# Patient Record
Sex: Female | Born: 1975 | Race: Black or African American | Hispanic: No | Marital: Single | Smoking: Never smoker
Health system: Southern US, Community
[De-identification: ages and names within clinical notes are randomized; demographics above are authoritative.]

## PROBLEM LIST (undated history)

## (undated) ENCOUNTER — Ambulatory Visit (HOSPITAL_COMMUNITY): Admission: EM | Payer: BC Managed Care – PPO | Source: Home / Self Care

## (undated) DIAGNOSIS — A048 Other specified bacterial intestinal infections: Secondary | ICD-10-CM

## (undated) DIAGNOSIS — K635 Polyp of colon: Secondary | ICD-10-CM

## (undated) DIAGNOSIS — D497 Neoplasm of unspecified behavior of endocrine glands and other parts of nervous system: Secondary | ICD-10-CM

## (undated) DIAGNOSIS — E7800 Pure hypercholesterolemia, unspecified: Secondary | ICD-10-CM

## (undated) DIAGNOSIS — R87629 Unspecified abnormal cytological findings in specimens from vagina: Secondary | ICD-10-CM

## (undated) DIAGNOSIS — K219 Gastro-esophageal reflux disease without esophagitis: Secondary | ICD-10-CM

## (undated) DIAGNOSIS — G43909 Migraine, unspecified, not intractable, without status migrainosus: Secondary | ICD-10-CM

## (undated) DIAGNOSIS — Z889 Allergy status to unspecified drugs, medicaments and biological substances status: Secondary | ICD-10-CM

## (undated) DIAGNOSIS — E041 Nontoxic single thyroid nodule: Secondary | ICD-10-CM

## (undated) HISTORY — DX: Unspecified abnormal cytological findings in specimens from vagina: R87.629

## (undated) HISTORY — DX: Gastro-esophageal reflux disease without esophagitis: K21.9

## (undated) HISTORY — DX: Migraine, unspecified, not intractable, without status migrainosus: G43.909

## (undated) HISTORY — DX: Polyp of colon: K63.5

## (undated) HISTORY — DX: Neoplasm of unspecified behavior of endocrine glands and other parts of nervous system: D49.7

## (undated) HISTORY — DX: Nontoxic single thyroid nodule: E04.1

## (undated) HISTORY — DX: Pure hypercholesterolemia, unspecified: E78.00

## (undated) HISTORY — DX: Other specified bacterial intestinal infections: A04.8

## (undated) HISTORY — DX: Allergy status to unspecified drugs, medicaments and biological substances: Z88.9

## (undated) HISTORY — PX: OTHER SURGICAL HISTORY: SHX169

---

## 2017-02-12 ENCOUNTER — Ambulatory Visit: Payer: Self-pay | Admitting: Family Medicine

## 2017-03-05 ENCOUNTER — Encounter: Payer: Self-pay | Admitting: Obstetrics & Gynecology

## 2017-03-14 ENCOUNTER — Encounter: Payer: Self-pay | Admitting: Nurse Practitioner

## 2017-03-14 ENCOUNTER — Other Ambulatory Visit (HOSPITAL_COMMUNITY)
Admission: RE | Admit: 2017-03-14 | Discharge: 2017-03-14 | Disposition: A | Discharge status: Home / Self Care | Payer: BLUE CROSS/BLUE SHIELD | Source: Ambulatory Visit | Attending: Nurse Practitioner | Admitting: Nurse Practitioner

## 2017-03-14 ENCOUNTER — Ambulatory Visit: Payer: Medicaid Other | Admitting: Nurse Practitioner

## 2017-03-14 VITALS — BP 112/72 | HR 92 | Ht 62.0 in | Wt 163.6 lb

## 2017-03-14 DIAGNOSIS — Z1389 Encounter for screening for other disorder: Secondary | ICD-10-CM | POA: Diagnosis not present

## 2017-03-14 DIAGNOSIS — Z01419 Encounter for gynecological examination (general) (routine) without abnormal findings: Secondary | ICD-10-CM | POA: Diagnosis not present

## 2017-03-14 DIAGNOSIS — Z0000 Encounter for general adult medical examination without abnormal findings: Secondary | ICD-10-CM

## 2017-03-14 DIAGNOSIS — A5901 Trichomonal vulvovaginitis: Secondary | ICD-10-CM | POA: Diagnosis not present

## 2017-03-14 DIAGNOSIS — D352 Benign neoplasm of pituitary gland: Secondary | ICD-10-CM | POA: Insufficient documentation

## 2017-03-14 DIAGNOSIS — E049 Nontoxic goiter, unspecified: Secondary | ICD-10-CM

## 2017-03-14 DIAGNOSIS — D259 Leiomyoma of uterus, unspecified: Secondary | ICD-10-CM | POA: Insufficient documentation

## 2017-03-14 DIAGNOSIS — N6011 Diffuse cystic mastopathy of right breast: Secondary | ICD-10-CM | POA: Insufficient documentation

## 2017-03-14 DIAGNOSIS — Z113 Encounter for screening for infections with a predominantly sexual mode of transmission: Secondary | ICD-10-CM | POA: Diagnosis not present

## 2017-03-14 DIAGNOSIS — N6012 Diffuse cystic mastopathy of left breast: Secondary | ICD-10-CM

## 2017-03-14 NOTE — Patient Instructions (Addendum)
  Get Mammogram Advise weight loss to under 135 pounds with health food choices and increasing your exercise. Drink at least 8 8-oz glasses of water every day. Call if you have questions or concerns.

## 2017-03-14 NOTE — Progress Notes (Signed)
GYNECOLOGY ANNUAL PREVENTATIVE CARE ENCOUNTER NOTE  Subjective:   Kristin Daniels is a 41 y.o. [phone] female here for a routine annual gynecologic exam.  Current complaints: increased vaginal discharge - possibly a yeast infection but does not have any itching.   Denies  pelvic pain, problems with intercourse.  Also has concerns about periodic breast pain.  Has had one mammogram over a year ago which showed benign fibrocystic texture in her breast.   Feels many small bumps in her breasts but has not noticed any changes in size but does have periodic breast pain not associated with her menstrual cycle. Has lived in [place] for at least a year - has been seen in [place] at the [facility] and by her endocrinologist in [place] for management of her known pituitary tumor and thyroid enlargement and nodules.   Gynecologic History Patient's last menstrual period was 03/03/2017. Contraception: condoms - has used for years. Last Pap: not on file here. Results were: normal per patient. Last mammogram: one year ago. Results were: normal  Obstetric History OB History  Gravida Para Term Preterm AB Living  2 1   1 1 1   SAB TAB Ectopic Multiple Live Births  1       1    # Outcome Date GA Lbr Len/2nd Weight Sex Delivery Anes PTL Lv  2 Preterm 2011 [redacted]w[redacted]d  7 lb 2 oz (3.232 kg) M CS-LTranv EPI N LIV  1 SAB 1995              Past Medical History:  Diagnosis Date  . Vaginal Pap smear, abnormal     Past Surgical History:  Procedure Laterality Date  . CESAREAN SECTION      Current Outpatient Medications on File Prior to Visit  Medication Sig Dispense Refill  . cabergoline (DOSTINEX) 0.5 MG tablet Take 0.25 mg 2 (two) times a week by mouth.     No current facility-administered medications on file prior to visit.    Patient Active Problem List   Diagnosis Date Noted  . Enlarged thyroid gland 03/14/2017  . Fibrocystic breast changes, bilateral 03/14/2017  . 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 Uterine leiomyoma 03/14/2017    No Known Allergies  Social History   Socioeconomic History  . Marital status: Single    Spouse name: Not on file  . Number of children: Not on file  . Years of education: Not on file  . Highest education level: Not on file  Social Needs  . Financial resource strain: Not on file  . Food insecurity - worry: Not on file  . Food insecurity - inability: Not on file  . Transportation needs - medical: Not on file  . Transportation needs - non-medical: Not on file  Occupational History  . Not on file  Tobacco Use  . Smoking status: Never Smoker  . Smokeless tobacco: Never Used  Substance and Sexual Activity  . Alcohol use: No    Frequency: Never  . Drug use: No  . Sexual activity: Yes    Birth control/protection: Condom  Other Topics Concern  . Not on file  Social History Narrative  . Not on file    Family History  Problem Relation Age of Onset  . Hypertension Mother     The following portions of the patient's history were reviewed and updated as appropriate: allergies, current medications, past family history, past medical history, past social history, past surgical history and  problem list.  Review of Systems Pertinent items noted in HPI and remainder of comprehensive ROS otherwise negative.   Objective:  BP 112/72   Pulse 92   Ht 5\' 2"  (1.575 m)   Wt 163 lb 9.6 oz (74.2 kg)   LMP 03/03/2017   BMI 29.92 kg/m  CONSTITUTIONAL: Well-developed, well-nourished female in no acute distress.  HENT:  Normocephalic, atraumatic, External right and left ear normal. Oropharynx is clear and moist EYES: Conjunctivae and EOM are normal. Pupils are equal, round, and reactive to light. No scleral icterus.  NECK: Normal range of motion, supple, no masses.  Enlarged thyroid with left side larger than right but both sides enlarged - chronic condition for her.  SKIN: Skin is warm and dry. No rash noted. Not  diaphoretic. No erythema. No pallor. NEUROLOGIC: Alert and oriented to person, place, and time. Normal reflexes, muscle tone coordination. No cranial nerve deficit noted. PSYCHIATRIC: Normal mood and affect. Normal behavior. Normal judgment and thought content. CARDIOVASCULAR: Normal heart rate noted, regular rhythm RESPIRATORY: Clear to auscultation bilaterally. Effort and breath sounds normal, no problems with respiration noted. BREASTS: Symmetric in size. No masses, skin changes, nipple drainage, or lymphadenopathy. ABDOMEN: Soft, normal bowel sounds, no distention noted.  No tenderness, rebound or guarding.  PELVIC: Normal appearing external genitalia; normal appearing vaginal mucosa and cervix.  No abnormal discharge noted.  Pap smear obtained.  uterine size is approx 14 weeks, no other palpable masses, no uterine or adnexal tenderness. MUSCULOSKELETAL: Normal range of motion. No tenderness.  No cyanosis, clubbing, or edema.  2+ distal pulses.   Assessment and Plan:  1. Well female exam with routine gynecological exam   Will follow up results of pap smear and manage accordingly. GC, Chlam, Yeast, Trich and wet prep pending. Mammogram to be scheduled Advise weight loss to under 135 pounds with health food choices and increasing your exercise. Drink at least 8 8-oz glasses of water every day. Call if you have questions or concerns. Routine preventative health maintenance measures emphasized. Please refer to After Visit Summary for other counseling recommendations.   Chronic conditions - Patient Active Problem List   Diagnosis Date Noted  . Enlarged thyroid gland 03/14/2017  . Fibrocystic breast changes, bilateral 03/14/2017  . Benign tumor of pituitary gland ([place]) 03/14/2017  . Uterine leiomyoma 03/14/2017   Will get ROI and request visit summary from her endocrinologist in [place] to have a summary of pituitary tumor, enlarged thyroid and ultrasound report to review her  fibroids.  Will send request to get her scheduled for mammogram.  Earlie Server, RN, MSN, NP-BC Nurse Practitioner, [place] [facility]

## 2017-03-18 ENCOUNTER — Other Ambulatory Visit: Payer: Self-pay | Admitting: Nurse Practitioner

## 2017-03-18 MED ORDER — METRONIDAZOLE 500 MG PO TABS: 2000.0000 mg | ORAL_TABLET | Freq: Once | ORAL | 0 refills | Status: AC

## 2017-03-18 NOTE — Progress Notes (Signed)
See Pap result - medication sent to her pharmacy and note sent for the clinical staff to give her a call.

## 2017-03-19 LAB — CYTOLOGY - PAP
Bacterial vaginitis: NEGATIVE
CHLAMYDIA, DNA PROBE: NEGATIVE
Candida vaginitis: NEGATIVE
Diagnosis: NEGATIVE
HPV (WINDOPATH): NOT DETECTED
Neisseria Gonorrhea: NEGATIVE
Trichomonas: POSITIVE — AB

## 2017-03-20 ENCOUNTER — Encounter: Payer: Self-pay | Admitting: General Practice

## 2017-03-25 ENCOUNTER — Other Ambulatory Visit: Payer: Self-pay | Admitting: Nurse Practitioner

## 2017-03-25 DIAGNOSIS — Z1231 Encounter for screening mammogram for malignant neoplasm of breast: Secondary | ICD-10-CM

## 2017-04-21 ENCOUNTER — Ambulatory Visit: Payer: BLUE CROSS/BLUE SHIELD | Attending: Family Medicine | Admitting: Family Medicine

## 2017-04-21 ENCOUNTER — Other Ambulatory Visit: Payer: Self-pay

## 2017-04-21 ENCOUNTER — Encounter: Payer: Self-pay | Admitting: Family Medicine

## 2017-04-21 VITALS — BP 104/71 | HR 105 | Temp 98.9°F | Resp 18 | Ht 62.0 in | Wt 166.0 lb

## 2017-04-21 DIAGNOSIS — D497 Neoplasm of unspecified behavior of endocrine glands and other parts of nervous system: Secondary | ICD-10-CM | POA: Insufficient documentation

## 2017-04-21 DIAGNOSIS — Z79899 Other long term (current) drug therapy: Secondary | ICD-10-CM | POA: Diagnosis not present

## 2017-04-21 DIAGNOSIS — B9789 Other viral agents as the cause of diseases classified elsewhere: Secondary | ICD-10-CM | POA: Diagnosis not present

## 2017-04-21 DIAGNOSIS — E049 Nontoxic goiter, unspecified: Secondary | ICD-10-CM

## 2017-04-21 DIAGNOSIS — Z8639 Personal history of other endocrine, nutritional and metabolic disease: Secondary | ICD-10-CM

## 2017-04-21 DIAGNOSIS — J069 Acute upper respiratory infection, unspecified: Secondary | ICD-10-CM

## 2017-04-21 DIAGNOSIS — Z87898 Personal history of other specified conditions: Secondary | ICD-10-CM | POA: Diagnosis not present

## 2017-04-21 MED ORDER — GUAIFENESIN-DM 100-10 MG/5ML PO SYRP: 5.0000 mL | ORAL_SOLUTION | Freq: Four times a day (QID) | ORAL | 0 refills | Status: DC | PRN

## 2017-04-21 NOTE — Patient Instructions (Addendum)
Apply for orange card if needed.  Upper Respiratory Infection, Adult Most upper respiratory infections (URIs) are caused by a virus. A URI affects the nose, throat, and upper air passages. The most common type of URI is often called "the common cold." Follow these instructions at home:  Take medicines only as told by your doctor.  Gargle warm saltwater or take cough drops to comfort your throat as told by your doctor.  Use a warm mist humidifier or inhale steam from a shower to increase air moisture. This may make it easier to breathe.  Drink enough fluid to keep your pee (urine) clear or pale yellow.  Eat soups and other clear broths.  Have a healthy diet.  Rest as needed.  Go back to work when your fever is gone or your doctor says it is okay. ? You may need to stay home longer to avoid giving your URI to others. ? You can also wear a face mask and wash your hands often to prevent spread of the virus.  Use your inhaler more if you have asthma.  Do not use any tobacco products, including cigarettes, chewing tobacco, or electronic cigarettes. If you need help quitting, ask your doctor. Contact a doctor if:  You are getting worse, not better.  Your symptoms are not helped by medicine.  You have chills.  You are getting more short of breath.  You have brown or red mucus.  You have yellow or brown discharge from your nose.  You have pain in your face, especially when you bend forward.  You have a fever.  You have puffy (swollen) neck glands.  You have pain while swallowing.  You have white areas in the back of your throat. Get help right away if:  You have very bad or constant: ? Headache. ? Ear pain. ? Pain in your forehead, behind your eyes, and over your cheekbones (sinus pain). ? Chest pain.  You have long-lasting (chronic) lung disease and any of the following: ? Wheezing. ? Long-lasting cough. ? Coughing up blood. ? A change in your usual mucus.  You  have a stiff neck.  You have changes in your: ? Vision. ? Hearing. ? Thinking. ? Mood. This information is not intended to replace advice given to you by your health care provider. Make sure you discuss any questions you have with your health care provider. Document Released: 10/09/2007 Document Revised: 12/24/2015 Document Reviewed: 07/28/2013 Elsevier Interactive Patient Education  2018 [employer].

## 2017-04-21 NOTE — Progress Notes (Signed)
Patient is here for establ care   Patient has cold SX

## 2017-04-21 NOTE — Progress Notes (Signed)
Subjective:  Patient ID: Kristin Daniels, female    DOB: 01-04-1976  Age: 41 y.o. MRN: [id number]  CC: Establish Care   HPI Kristin Daniels presents to establish care. She is a from [place] and moved here to [place] 2 years ago. History of thyroid disease and pituitary tumor. She reports being diagnosed in 2000 and being followed by a endocrinologist there. She reports traveling from [facility] to [place] to see her endocrinologist specialist. She reports last visit was summer 2018. She brings results of last MRI 03/2016, with her. Imaging showed: Central left sided pituitary , likely proteinaceous, mm x 4 mm x 4.5 mm extending to the insertion of the infundibulum which is slightly deviated tower the right and mild remodeling of the sella floor. She is currently taking Cabergoline 0.25 mg once weekly. She reports tenderness. She c/o URI symptoms. Onset 2 weeks ago. She reports recent sick contacts. Symptoms include body aches, coughing, and sneezing. Denies any fevers. She is not taking anything currently for symptoms.     Outpatient Medications Prior to Visit  Medication Sig Dispense Refill  . cabergoline (DOSTINEX) 0.5 MG tablet Take 0.25 mg 2 (two) times a week by mouth.     No facility-administered medications prior to visit.     ROS Review of Systems  Constitutional: Negative.   HENT: Positive for sneezing. Negative for trouble swallowing.   Eyes: Negative.   Respiratory: Positive for cough.   Cardiovascular: Negative.   Gastrointestinal: Negative.   Musculoskeletal: Positive for myalgias.  Skin: Negative.   Neurological: Negative for weakness.  Psychiatric/Behavioral: Negative.    Objective:  BP 104/71 (BP Location: Left Arm, Patient Position: Sitting, Cuff Size: Normal)   Pulse (!) 105   Temp 98.9 F (37.2 C) (Oral)   Resp 18   Ht 5\' 2"  (1.575 m)   Wt 166 lb (75.3 kg)   SpO2 97%   BMI 30.36 kg/m   BP/Weight 04/21/2017 47/10/5463  Systolic BP 035 465  Diastolic BP 71 72    Wt. (Lbs) 166 163.6  BMI 30.36 29.92     Physical Exam  Constitutional: She appears well-developed and well-nourished.  HENT:  Head: Normocephalic and atraumatic.  Right Ear: External ear normal.  Left Ear: External ear normal.  Nose: Nose normal.  Mouth/Throat: Oropharynx is clear and moist.  Eyes: Conjunctivae are normal. Pupils are equal, round, and reactive to light.  Neck: No JVD present. Thyromegaly present.  Cardiovascular: Normal rate, regular rhythm, normal heart sounds and intact distal pulses.  Pulmonary/Chest: Effort normal and breath sounds normal.  Abdominal: Soft. Bowel sounds are normal.  Lymphadenopathy:    She has cervical adenopathy (tenderness to anterior cervical lymphnodes).  Skin: Skin is warm and dry.  Psychiatric: She has a normal mood and affect.  Nursing note and vitals reviewed.   Assessment & Plan:   1. Enlarged thyroid gland  - Thyroid Panel With TSH; Future - Prolactin - Ambulatory referral to Endocrinology  2. History of pituitary tumor  - Thyroid Panel With TSH; Future - Prolactin - Ambulatory referral to Endocrinology  3. History of thyroid nodule  - Thyroid Panel With TSH; Future - Ambulatory referral to Endocrinology  4. Viral URI with cough  - guaiFENesin-dextromethorphan (ROBITUSSIN DM) 100-10 MG/5ML syrup; Take 5 mLs by mouth every 6 (six) hours as needed for cough.  Dispense: 236 mL; Refill: 0      Follow-up: Return in about 8 weeks (around 06/16/2017), or if symptoms worsen or fail to improve,  for Thyroid Disease.   Alfonse Spruce FNP

## 2017-04-22 LAB — THYROID PANEL WITH TSH
Free Thyroxine Index: 1.2 (ref 1.2–4.9)
T3 Uptake Ratio: 21 % — ABNORMAL LOW (ref 24–39)
T4 TOTAL: 5.9 ug/dL (ref 4.5–12.0)
TSH: 0.553 u[IU]/mL (ref 0.450–4.500)

## 2017-04-22 LAB — PROLACTIN: PROLACTIN: 29.5 ng/mL — AB (ref 4.8–23.3)

## 2017-05-08 ENCOUNTER — Other Ambulatory Visit: Payer: Self-pay | Admitting: Family Medicine

## 2017-05-08 ENCOUNTER — Telehealth: Payer: Self-pay

## 2017-05-08 DIAGNOSIS — E221 Hyperprolactinemia: Secondary | ICD-10-CM

## 2017-05-08 DIAGNOSIS — Z87898 Personal history of other specified conditions: Secondary | ICD-10-CM

## 2017-05-08 MED ORDER — CABERGOLINE 0.5 MG PO TABS: 0.2500 mg | ORAL_TABLET | ORAL | 1 refills | Status: DC

## 2017-05-08 NOTE — Telephone Encounter (Signed)
-----   Message from Alfonse Spruce, [place] sent at 05/08/2017  9:49 AM EST ----- Prolactin levels elevated. Your dose of cabergoline will be increased. Thyroid level normal Follow up with endocrinologist referral.

## 2017-05-09 NOTE — Telephone Encounter (Signed)
-----   Message from Alfonse Spruce, [place] sent at 05/08/2017  9:49 AM EST ----- Prolactin levels elevated. Your dose of cabergoline will be increased. Thyroid level normal Follow up with endocrinologist referral.

## 2017-05-09 NOTE — Telephone Encounter (Signed)
CMA attempt to call patient regarding about lab result and no answer.  Left a VM for patient to call back.    Communication letter will be send out.

## 2017-05-21 ENCOUNTER — Ambulatory Visit
Admission: RE | Admit: 2017-05-21 | Discharge: 2017-05-21 | Disposition: A | Discharge status: Home / Self Care | Payer: Medicaid Other | Source: Ambulatory Visit | Attending: Nurse Practitioner | Admitting: Nurse Practitioner

## 2017-05-21 ENCOUNTER — Encounter: Payer: Self-pay | Admitting: *Deleted

## 2017-05-21 DIAGNOSIS — Z1231 Encounter for screening mammogram for malignant neoplasm of breast: Secondary | ICD-10-CM

## 2017-05-30 ENCOUNTER — Ambulatory Visit: Payer: BLUE CROSS/BLUE SHIELD | Attending: Family Medicine | Admitting: Family Medicine

## 2017-05-30 ENCOUNTER — Encounter: Payer: Self-pay | Admitting: Family Medicine

## 2017-05-30 VITALS — BP 113/75 | HR 68 | Temp 98.0°F | Ht 62.0 in | Wt 170.2 lb

## 2017-05-30 DIAGNOSIS — R05 Cough: Secondary | ICD-10-CM | POA: Diagnosis present

## 2017-05-30 DIAGNOSIS — E079 Disorder of thyroid, unspecified: Secondary | ICD-10-CM | POA: Diagnosis present

## 2017-05-30 DIAGNOSIS — D497 Neoplasm of unspecified behavior of endocrine glands and other parts of nervous system: Secondary | ICD-10-CM | POA: Insufficient documentation

## 2017-05-30 DIAGNOSIS — E049 Nontoxic goiter, unspecified: Secondary | ICD-10-CM | POA: Diagnosis not present

## 2017-05-30 DIAGNOSIS — J069 Acute upper respiratory infection, unspecified: Secondary | ICD-10-CM | POA: Diagnosis not present

## 2017-05-30 DIAGNOSIS — Z87898 Personal history of other specified conditions: Secondary | ICD-10-CM | POA: Diagnosis not present

## 2017-05-30 MED ORDER — AZITHROMYCIN 250 MG PO TABS
ORAL_TABLET | ORAL | 0 refills | Status: DC
Start: 2017-05-30 — End: 2017-07-09

## 2017-05-30 MED ORDER — GUAIFENESIN ER 600 MG PO TB12: 600.0000 mg | ORAL_TABLET | Freq: Two times a day (BID) | ORAL | 0 refills | Status: DC

## 2017-05-30 MED ORDER — FLUTICASONE PROPIONATE 50 MCG/ACT NA SUSP: 2.0000 | Freq: Every day | NASAL | 0 refills | Status: DC

## 2017-05-30 NOTE — Patient Instructions (Addendum)
Keep appointment with endocrinology.  Upper Respiratory Infection, Adult Most upper respiratory infections (URIs) are caused by a virus. A URI affects the nose, throat, and upper air passages. The most common type of URI is often called "the common cold." Follow these instructions at home:  Take medicines only as told by your doctor.  Gargle warm saltwater or take cough drops to comfort your throat as told by your doctor.  Use a warm mist humidifier or inhale steam from a shower to increase air moisture. This may make it easier to breathe.  Drink enough fluid to keep your pee (urine) clear or pale yellow.  Eat soups and other clear broths.  Have a healthy diet.  Rest as needed.  Go back to work when your fever is gone or your doctor says it is okay. ? You may need to stay home longer to avoid giving your URI to others. ? You can also wear a face mask and wash your hands often to prevent spread of the virus.  Use your inhaler more if you have asthma.  Do not use any tobacco products, including cigarettes, chewing tobacco, or electronic cigarettes. If you need help quitting, ask your doctor. Contact a doctor if:  You are getting worse, not better.  Your symptoms are not helped by medicine.  You have chills.  You are getting more short of breath.  You have brown or red mucus.  You have yellow or brown discharge from your nose.  You have pain in your face, especially when you bend forward.  You have a fever.  You have puffy (swollen) neck glands.  You have pain while swallowing.  You have white areas in the back of your throat. Get help right away if:  You have very bad or constant: ? Headache. ? Ear pain. ? Pain in your forehead, behind your eyes, and over your cheekbones (sinus pain). ? Chest pain.  You have long-lasting (chronic) lung disease and any of the following: ? Wheezing. ? Long-lasting cough. ? Coughing up blood. ? A change in your usual  mucus.  You have a stiff neck.  You have changes in your: ? Vision. ? Hearing. ? Thinking. ? Mood. This information is not intended to replace advice given to you by your health care provider. Make sure you discuss any questions you have with your health care provider. Document Released: 10/09/2007 Document Revised: 12/24/2015 Document Reviewed: 07/28/2013 Elsevier Interactive Patient Education  2018 [employer].

## 2017-05-30 NOTE — Progress Notes (Signed)
   Subjective:  Patient ID: Kristin Daniels, female    DOB: May 10, 1975  Age: 42 y.o. MRN: [id number]  CC: Thyroid Problem and Cough   HPI Kristin Daniels presents for follow up for complains of symptoms of a URI. Symptoms include productive cough with thick, green sputum; chest congestion; rhinorrhea. Onset of symptoms since Thanksgiving. Symptoms have persisted. She reports sick contact. She reports taking robitussin without relief of symptoms. She is not a smoker.   History of enlarged thyroid and pituitary tumor. She reports upcoming appointment with endocrinology Feb. 11. She reports adherence with cabergoline. Reports starting increased dosage 2 weeks ago. She denies any CVS or bowel problems.   Outpatient Medications Prior to Visit  Medication Sig Dispense Refill  . cabergoline (DOSTINEX) 0.5 MG tablet Take 0.5 tablets (0.25 mg total) by mouth 2 (two) times a week. 5 tablet 1  . guaiFENesin-dextromethorphan (ROBITUSSIN DM) 100-10 MG/5ML syrup Take 5 mLs by mouth every 6 (six) hours as needed for cough. 236 mL 0   No facility-administered medications prior to visit.     ROS Review of Systems  Constitutional: Negative.   HENT: Positive for congestion and rhinorrhea.   Eyes: Negative for discharge.  Respiratory: Positive for cough.   Cardiovascular: Negative.   Gastrointestinal: Negative.   Endocrine: Negative.   Skin: Negative.     Objective:  BP 113/75   Pulse 68   Temp 98 F (36.7 C) (Oral)   Ht 5\' 2"  (1.575 m)   Wt 170 lb 3.2 oz (77.2 kg)   SpO2 100%   BMI 31.13 kg/m   BP/Weight 05/30/2017 04/21/2017 30/0/9233  Systolic BP 007 622 633  Diastolic BP 75 71 72  Wt. (Lbs) 170.2 166 163.6  BMI 31.13 30.36 29.92     Physical Exam  Constitutional: She appears well-developed and well-nourished.  HENT:  Head: Normocephalic and atraumatic.  Right Ear: External ear normal.  Left Ear: External ear normal.  Nose: Mucosal edema and rhinorrhea present.  Mouth/Throat:  Oropharynx is clear and moist.  Eyes: Conjunctivae are normal. Right eye exhibits no discharge. Left eye exhibits no discharge.  Neck: Normal range of motion. Neck supple. No JVD present. Thyromegaly present.  Cardiovascular: Normal rate, regular rhythm, normal heart sounds and intact distal pulses.  Pulmonary/Chest: Effort normal and breath sounds normal. She has no wheezes.  Abdominal: Soft. Bowel sounds are normal.  Lymphadenopathy:    She has no cervical adenopathy.  Skin: Skin is warm and dry.  Psychiatric: She has a normal mood and affect.  Nursing note and vitals reviewed.   Assessment & Plan:   1. Upper respiratory tract infection, unspecified type  - guaiFENesin (MUCINEX) 600 MG 12 hr tablet; Take 1 tablet (600 mg total) by mouth 2 (two) times daily.  Dispense: 14 tablet; Refill: 0 - azithromycin (ZITHROMAX) 250 MG tablet; TAKE TWO TABLETS BY MOUTH DAY ONE. THEN TAKE ON TABLET BY MOUTH DAILY.  Dispense: 6 tablet; Refill: 0 - fluticasone (FLONASE) 50 MCG/ACT nasal spray; Place 2 sprays into both nostrils daily.  Dispense: 16 g; Refill: 0  2. Enlarged thyroid gland Keep endocrinology appointment.     3. History of pituitary tumor Keep endocrinology appointment.      Follow-up: Return if symptoms worsen or fail to improve.   Alfonse Spruce FNP

## 2017-06-15 ENCOUNTER — Encounter: Payer: Self-pay | Admitting: Endocrinology

## 2017-06-15 NOTE — Progress Notes (Deleted)
Patient ID: Kristin Daniels, female   DOB: 01/03/1976, 42 y.o.   MRN: [id number]           Chief complaint:   History of Present Illness  She has had missed menstrual cycles since   She does not complain of any milky breast discharge She does not complain of any unusual headaches or change in vision  Prolactin levels:  Lab Results  Component Value Date   PROLACTIN 29.5 (H) 04/21/2017     MRI of pituitary gland shows    She is now referred here for further management     Lab Results  Component Value Date   TSH 0.553 04/21/2017     Allergies as of 06/16/2017   No Known Allergies     Medication List        Accurate as of 06/15/17  8:00 PM. Always use your most recent med list.          azithromycin 250 MG tablet Commonly known as:  ZITHROMAX TAKE TWO TABLETS BY MOUTH DAY ONE. THEN TAKE ON TABLET BY MOUTH DAILY.   cabergoline 0.5 MG tablet Commonly known as:  DOSTINEX Take 0.5 tablets (0.25 mg total) by mouth 2 (two) times a week.   fluticasone 50 MCG/ACT nasal spray Commonly known as:  FLONASE Place 2 sprays into both nostrils daily.   guaiFENesin 600 MG 12 hr tablet Commonly known as:  MUCINEX Take 1 tablet (600 mg total) by mouth 2 (two) times daily.   guaiFENesin-dextromethorphan 100-10 MG/5ML syrup Commonly known as:  ROBITUSSIN DM Take 5 mLs by mouth every 6 (six) hours as needed for cough.       Allergies: No Known Allergies  Past Medical History:  Diagnosis Date  . Vaginal Pap smear, abnormal     Past Surgical History:  Procedure Laterality Date  . CESAREAN SECTION      Family History  Problem Relation Age of Onset  . Hypertension Mother     Social History:  reports that  has never smoked. she has never used smokeless tobacco. She reports that she does not drink alcohol or use drugs.   Review of Systems   EXAM:  There were no vitals taken for this visit.  Physical Exam   ASSESSMENT:   Prolactinoma  with  PLAN:

## 2017-06-16 ENCOUNTER — Ambulatory Visit: Payer: BLUE CROSS/BLUE SHIELD | Admitting: Endocrinology

## 2017-06-16 ENCOUNTER — Ambulatory Visit: Payer: BLUE CROSS/BLUE SHIELD | Admitting: Family Medicine

## 2017-07-09 ENCOUNTER — Encounter: Payer: Self-pay | Admitting: Endocrinology

## 2017-07-09 ENCOUNTER — Ambulatory Visit (INDEPENDENT_AMBULATORY_CARE_PROVIDER_SITE_OTHER): Payer: BLUE CROSS/BLUE SHIELD | Admitting: Endocrinology

## 2017-07-09 VITALS — BP 118/80 | HR 76 | Ht 62.0 in | Wt 170.0 lb

## 2017-07-09 DIAGNOSIS — E042 Nontoxic multinodular goiter: Secondary | ICD-10-CM

## 2017-07-09 DIAGNOSIS — K1120 Sialoadenitis, unspecified: Secondary | ICD-10-CM | POA: Diagnosis not present

## 2017-07-09 DIAGNOSIS — D352 Benign neoplasm of pituitary gland: Secondary | ICD-10-CM

## 2017-07-09 NOTE — Progress Notes (Signed)
Patient ID: Kristin Daniels, female   DOB: [birth year], 42 y.o.   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 to her physician with complaints of milky discharge from her breasts and headaches She had not had any late or missed menstrual cycles at that time She reportedly was diagnosed to have a prolactinoma and started on cabergoline She does not know what her baseline prolactin level was  She had been followed by an endocrinologist in [place] state with periodic lab work and MRIs and continued on cabergoline Only a few reports are available from previous records and not clear of her prolactin has been consistently controlled In 12/16 her prolactin was 45   She thinks her lowest prolactin has been about 11 About 6 months prior to her visit in December with her PCP she was told to start taking 1/2 tablet only once a week with her Dostinex However late last year she started having some headaches and also breast fullness and mild galactorrhea When her prolactin level was 29 she was told to increase her dosage to half tablet twice a week and she is here for further follow-up She has had only minimal headaches now and no further milky discharge or breast tenderness She has not had any blurred vision or peripheral vision changes  Prolactin levels:  Lab Results  Component Value Date   PROLACTIN 29.5 (H) 04/21/2017     MRI of pituitary gland as of 03/15/2016 shows a 7 mm posterior central left-sided lobulated pituitary adenoma extending to the insertion of the infundibulum which is slightly deviated towards the right  She is now referred here for further management  Past history:  During her pregnancy when she was not on regular medication she developed severe diabetes insipidus who  which was controlled with her restarting treatment and she took initially bromocriptine and subsequently cabergoline during her pregnancy   PROBLEM 2:  Multinodular goiter.  She had this diagnosed on routine exam several years ago Patient herself had not noticed any swelling in her neck Also had not experienced any symptoms of local pressure, difficulty swallowing or choking  No formal ultrasound reports are available on review of previous records and no description of her exam is available from previous notes also Review of records indicate that she probably has had needle aspiration biopsies of the left dominant nodule 3 times Patient does not have recall of specific dates when she had the biopsies  Her first biopsy was done in 09/2011 which had shown indeterminate cytology and this was confirmed to be benign on Affirma testing At that time her nodule was 2.8 cm  Needle aspiration done in 01/2015 indicated she had a 3 cm nodule that previously had been benign; this showed scanty cellular specimen along with colloid and degenerated macrophages In 08/2016 the needle aspiration biopsy showed a benign follicular adenoma and the nodule size was indicated at 3.7 cm    Allergies as of 07/09/2017   No Known Allergies     Medication List        Accurate as of 07/09/17  4:16 PM. Always use your most recent med list.          cabergoline 0.5 MG tablet Commonly known as:  DOSTINEX Take 0.5 tablets (0.25 mg total)  by mouth 2 (two) times a week.   fluticasone 50 MCG/ACT nasal spray Commonly known as:  FLONASE Place 2 sprays into both nostrils daily.       Allergies: No Known Allergies  Past Medical History:  Diagnosis Date  . Vaginal Pap smear, abnormal     Past Surgical History:  Procedure Laterality Date  . CESAREAN SECTION      Family History  Problem Relation Age of Onset  . Hypertension Mother     Social History:  reports that  has never smoked. she has never used  smokeless tobacco. She reports that she does not drink alcohol or use drugs.   Review of Systems  Constitutional: Negative for weight loss.  HENT: Positive for headaches.        Has only mild headaches now, previously before increasing cabergoline she was having generalized nagging headaches not relieved by Advil  Eyes: Negative for blurred vision.  Respiratory: Positive for daytime sleepiness.   Endocrine: Positive for fatigue. Negative for polydipsia.  Genitourinary: Positive for nocturia. Negative for frequency.       Once or twice  Psychiatric/Behavioral: Positive for insomnia.    EXAM:  BP 118/80 (BP Location: Left Arm, Patient Position: Sitting, Cuff Size: Normal)   Pulse 76   Ht 5\' 2"  (1.575 m)   Wt 170 lb (77.1 kg)   SpO2 98%   BMI 31.09 kg/m   Physical Exam  Constitutional:  She has mild generalized obesity, looks well  HENT:  Mouth/Throat: Oropharynx is clear and moist.  Eyes: Conjunctivae are normal.  Peripheral vision normal by confrontation Fundi not clearly seen but optical discs appear normal  Neck: Thyromegaly present.  Left-sided firm smooth thyroid nodule is palpable and is about 3.8-4 cm in height.  Right lobe minimally palpable and softer, no nodules felt Slight enlargement of left submandibular gland present.  No stridor  Cardiovascular: Regular rhythm and normal heart sounds. Exam reveals no gallop.  No murmur heard. Pulmonary/Chest: Breath sounds normal. She has no wheezes. She has no rales.  Abdominal: Soft. She exhibits no distension and no mass. There is no tenderness.  Musculoskeletal: She exhibits no edema.  Lymphadenopathy:    She has no cervical adenopathy.  Neurological:  Biceps reflexes show normal relaxation  Skin: Skin is warm and dry. No pallor.     ASSESSMENT:   Prolactinoma since 2009 with history of sellar mass that was heterogenous and 7 mm in original size and presenting mostly with headaches and galactorrhea.  Currently  her symptoms are controlled and she is taking relatively high dose of cabergoline of half tablet of the 0.5 mg twice a week since December.  No history of menstrual irregularities with this  History of diabetes insipidus, mostly during her pregnancy when she was not on adequate treatment and also reportedly when she is irregular with her medication.  This may be likely related to the location of her sellar mass extending to the insertion of the infundibulum.  No symptoms currently  Multinodular goiter, reportedly with a dominant left-sided nodule since at least 2013.  This has been shown to be benign on at least 2 biopsies including a formal testing.  Discussed that since she has confirmed benign nodule this will not need to be evaluated further.  Her exam today indicates about the same size of the nodule as in 2018.  She may need intervention only if she has local pressure symptoms which she is not having at this time  Mild sialoadenitis  of the left submandibular gland, she can be treated conservatively and encouraged her to use lemon drops or other tart candy to stimulate salivary secretion, may need follow-up with PCP  PLAN:  As above she will need to be evaluated with prolactin level today to establish adequacy of her treatment plan with cabergoline. Consider repeating MRI if she starts having increased headaches Also she will have periodic eye exams  Since she has a low normal TSH she is not a candidate for suppression with levothyroxine and this was discussed   Elayne Snare 07/09/17  Consultation note sent to PCP

## 2017-07-10 ENCOUNTER — Telehealth: Payer: Self-pay | Admitting: Endocrinology

## 2017-07-10 ENCOUNTER — Other Ambulatory Visit: Payer: Self-pay | Admitting: Endocrinology

## 2017-07-10 ENCOUNTER — Encounter: Payer: Self-pay | Admitting: Endocrinology

## 2017-07-10 DIAGNOSIS — Z87898 Personal history of other specified conditions: Secondary | ICD-10-CM

## 2017-07-10 DIAGNOSIS — E221 Hyperprolactinemia: Secondary | ICD-10-CM

## 2017-07-10 LAB — PROLACTIN: PROLACTIN: 24.3 ng/mL — AB (ref 4.8–23.3)

## 2017-07-10 MED ORDER — CABERGOLINE 0.5 MG PO TABS: ORAL_TABLET | ORAL | 1 refills | Status: DC

## 2017-07-10 NOTE — Telephone Encounter (Signed)
Left generic message for patient to call us to schedule appointments per Dr. Ronnie Derby request (Patient needs f/u appointment in 2 months with labs a few days prior to f/u)

## 2017-07-10 NOTE — Telephone Encounter (Signed)
Can this be scheduled through the front desk please

## 2017-07-16 ENCOUNTER — Ambulatory Visit: Payer: BLUE CROSS/BLUE SHIELD

## 2017-07-17 ENCOUNTER — Telehealth: Payer: Self-pay | Admitting: Family Medicine

## 2017-07-17 NOTE — Telephone Encounter (Signed)
Patient called to say she had been treated for a UTI, and now has a yeast infection. Would like a call back from nurse to see if something can be called in.

## 2017-07-25 ENCOUNTER — Other Ambulatory Visit: Payer: Self-pay

## 2017-07-25 MED ORDER — FLUCONAZOLE 150 MG PO TABS: 150.0000 mg | ORAL_TABLET | Freq: Once | ORAL | 0 refills | Status: AC

## 2017-07-30 DIAGNOSIS — D490 Neoplasm of unspecified behavior of digestive system: Secondary | ICD-10-CM | POA: Insufficient documentation

## 2017-09-15 ENCOUNTER — Encounter: Payer: Self-pay | Admitting: Family Medicine

## 2017-10-06 NOTE — Progress Notes (Deleted)
   Subjective:   Patient ID: Kristin Daniels    DOB: [birth year], 42 y.o. female   MRN: [id number]  Kristin Daniels is a 42 y.o. female with a history of pituitary gland tumor, uterine leiomyoma here for   Establish Care - [place], medications reviewed - thyroid disease and prolactinoma - dxed 2000, followed endocrinologist in [place]. Has since seen Endocrinology in [place]. Currently taking cabergoline 1 tablet (0.25mg ) on Sundays and 0.5 tablet on Thursdays.   Review of Systems:  Per HPI.   [place]: reviewed. Smoking status reviewed. Medications reviewed.  Objective:   There were no vitals taken for this visit. Vitals and nursing note reviewed.  General: well nourished, well developed, in no acute distress with non-toxic appearance HEENT: normocephalic, atraumatic, moist mucous membranes Neck: supple, non-tender without lymphadenopathy CV: regular rate and rhythm without murmurs, rubs, or gallops, no lower extremity edema Lungs: clear to auscultation bilaterally with normal work of breathing Abdomen: soft, non-tender, non-distended, no masses or organomegaly palpable, normoactive bowel sounds Skin: warm, dry, no rashes or lesions Extremities: warm and well perfused, normal tone MSK: ROM grossly intact, strength intact, gait normal Neuro: Alert and oriented, speech normal  Assessment & Plan:   No problem-specific Assessment & Plan notes found for this encounter.  No orders of the defined types were placed in this encounter.  No orders of the defined types were placed in this encounter.   Rory Percy, DO PGY-1, [facility] 10/06/2017 5:54 PM

## 2017-10-07 ENCOUNTER — Ambulatory Visit: Payer: BLUE CROSS/BLUE SHIELD | Admitting: Family Medicine

## 2017-12-28 NOTE — Progress Notes (Signed)
Subjective:   Patient ID: Kristin Daniels    DOB: 12/03/1975, 42 y.o. female   MRN: [id number]  Kristin Daniels is a 42 y.o. female with a history of pituitary tumor here for   Establish Care - moved to [facility] 2016 from [place]. Previously seen by Faylene Kurtz in [facility]. - h/o pituitary tumor (prolactinoma), thyroid nodules - previously followed by endocrinologist in [place]. Currently taking Cabergoline 0.5mg  twice weekly. Currently followed by [facility], last visit 07/2017.  Reproductive History - [id number] - contraception: none - LMP: 12/29/2017 - every 19-28 days, flow regular (heavy in the beginning)  Healthcare Maintenance - Vaccines: due for influenza - Colonoscopy: ~2011, hasn't had one since being in [place]. Had some hemorrhoids and polyps removed. Isn't sure when she is supposed to have it repeated. - Mammogram: UTD - Pap Smear: UTD  Constipation: Patient complains of constipation.  Stool pattern has been 1-2 soft stool(s) per week. Onset was several years ago, around 2003-04. Defecation has been alternation of loose and hard stools. Co-Morbid conditions:endocrine disease. Symptoms have been waxing and waning over the course of several years. Current Health Habits: Eating fiber? Yes. Water intake? yes, notes to have drank a lot due to pituitary tumor. Current OTC/RX therapy has been Miralax which has been somewhat effective. Also c/o bloating and burning epigastric pain that is alleviated with prilosec. Reports she had a colonoscopy in [place] ~2005 and was told she had colitis and had some hemorrhoids and polyps removed. She isn't sure when she is supposed to have it repeated. Also had a upper endoscopy at that time. States she thinks her BMs may have been regular as a teenager but she is unsure. States her grandmother has issues with her colon but doesn't know if she has a specific diagnosis, no other FH. Denies travel out of the  country. Usually eats one or two meals per day and may have a milkshake or ice cream for snack. - 24hr recall: ate 12n ([place] with avocado and salsa, potatoes), maybe some chips for snack around 10-11pm at night. Ginger ale, water to drink. Yesterday was typical day for her.  Review of Systems:  Per HPI.  [place], medications and smoking status reviewed.  Objective:   BP 110/64   Pulse 95   Temp 98.6 F (37 C) (Oral)   Ht 5\' 2"  (1.575 m)   Wt 164 lb (74.4 kg)   LMP 12/29/2017 (Exact Date)   SpO2 98%   BMI 30.00 kg/m  Vitals and nursing note reviewed.  General: obese female, in no acute distress with non-toxic appearance HEENT: normocephalic, atraumatic, moist mucous membranes CV: regular rate and rhythm without murmurs, rubs, or gallops Lungs: clear to auscultation bilaterally with normal work of breathing Abdomen: soft, slightly tender to palpation in epigastric and lower abdominal quadrants, non-distended, no masses or organomegaly palpable, normoactive bowel sounds Skin: warm, dry, no rashes or lesions Extremities: warm and well perfused, normal tone MSK: ROM grossly intact, strength intact, gait normal Neuro: Alert and oriented, speech normal  Assessment & Plan:   Establish Care Problem list, medications updated in EMR. Advised to continue to follow with Endocrinology for management of pituitary tumor and thyroid nodules.  Chronic idiopathic constipation Chronic with no recent worsening. Wants to be referred to GI for repeat colonoscopy and further workup. Order placed. Suggested diet changes with small frequent meals throughout the day with healthier food choices and incorporating more vegetables as she states  this helps her have a BM more frequently. Encouraged to continue using miralax and fiber supplements.   Orders Placed This Encounter  Procedures  . Ambulatory referral to Gastroenterology    Referral Priority:   Routine    Referral Type:    Consultation    Referral Reason:   Specialty Services Required    Number of Visits Requested:   1  . POCT urinalysis dipstick  . POCT UA - Microscopic Only   No orders of the defined types were placed in this encounter.  Rory Percy, DO PGY-2, [facility] 12/29/2017 6:29 PM

## 2017-12-29 ENCOUNTER — Other Ambulatory Visit: Payer: Self-pay

## 2017-12-29 ENCOUNTER — Ambulatory Visit (INDEPENDENT_AMBULATORY_CARE_PROVIDER_SITE_OTHER): Payer: PRIVATE HEALTH INSURANCE | Admitting: Family Medicine

## 2017-12-29 ENCOUNTER — Encounter: Payer: Self-pay | Admitting: Gastroenterology

## 2017-12-29 ENCOUNTER — Encounter: Payer: Self-pay | Admitting: Family Medicine

## 2017-12-29 VITALS — BP 110/64 | HR 95 | Temp 98.6°F | Ht 62.0 in | Wt 164.0 lb

## 2017-12-29 DIAGNOSIS — K5904 Chronic idiopathic constipation: Secondary | ICD-10-CM | POA: Insufficient documentation

## 2017-12-29 LAB — POCT URINALYSIS DIP (MANUAL ENTRY)
Bilirubin, UA: NEGATIVE
Glucose, UA: NEGATIVE mg/dL
Ketones, POC UA: NEGATIVE mg/dL
Leukocytes, UA: NEGATIVE
Nitrite, UA: NEGATIVE
SPEC GRAV UA: 1.02 (ref 1.010–1.025)
UROBILINOGEN UA: 1 U/dL
pH, UA: 7 (ref 5.0–8.0)

## 2017-12-29 LAB — POCT UA - MICROSCOPIC ONLY

## 2017-12-29 NOTE — Assessment & Plan Note (Signed)
Chronic with no recent worsening. Wants to be referred to GI for repeat colonoscopy and further workup. Order placed. Suggested diet changes with small frequent meals throughout the day with healthier food choices and incorporating more vegetables as she states this helps her have a BM more frequently. Encouraged to continue using miralax and fiber supplements.

## 2017-12-29 NOTE — Patient Instructions (Signed)
It was great to see you!  Our plans for today:  - Call Dr. Ronnie Derby office regarding refills for your cabergoline in case he wants to make dosage adjustments. - We are checking your urine for infection. - We are referring you to GI for your constipation and follow up on your colonoscopy. - It will likely be helpful to eat more small frequent meals to help with your constipation. See below for recommendations.  Take care and seek immediate care sooner if you develop any concerns.   Dr. [facility]  Here is an example of what a healthy plate looks like:    ? Make half your plate fruits and vegetables.     ? Focus on whole fruits.     ? Vary your veggies.  ? Make half your grains whole grains. -     ? Look for the word "whole" at the beginning of the ingredients list    ? Some whole-grain ingredients include whole oats, whole-wheat flour,        whole-grain corn, whole-grain brown rice, and whole rye.  ? Move to low-fat and fat-free milk or yogurt.  ? Vary your protein routine. - Meat, fish, poultry (chicken, [place]), eggs, beans (kidney, pinto), dairy.  ? Drink and eat less sodium, saturated fat, and added sugars.   Diet Recommendations for Diabetes   1. Eat at least 3 meals and 1-2 snacks per day. Never go more than 4-5 hours while awake without eating. Eat breakfast within the first hour of getting up.   2. Limit starchy foods to TWO per meal and ONE per snack. ONE portion of a starchy  food is equal to the following:   - ONE slice of bread (or its equivalent, such as half of a hamburger bun).   - 1/2 cup of a "scoopable" starchy food such as potatoes or rice.   - 15 grams of Total Carbohydrate as shown on food label.  3. Include at every meal: a protein food, a carb food, and vegetables and/or fruit.   - Obtain twice the volume of vegetables as protein or carbohydrate foods for both lunch and dinner.   - Fresh or frozen vegetables are best.   - Keep frozen  vegetables on hand for a quick vegetable serving.       Starchy (carb) foods: Bread, rice, pasta, potatoes, corn, cereal, grits, crackers, bagels, muffins, all baked goods.  (Fruits, milk, and yogurt also have carbohydrate, but most of these foods will not spike your blood sugar as most starchy foods will.)  A few fruits do cause high blood sugars; use small portions of bananas (limit to 1/2 at a time), grapes, watermelon, oranges, and most tropical fruits.    Protein foods: Meat, fish, poultry, eggs, dairy foods, and beans such as pinto and kidney beans (beans also provide carbohydrate).

## 2018-01-08 ENCOUNTER — Other Ambulatory Visit (INDEPENDENT_AMBULATORY_CARE_PROVIDER_SITE_OTHER): Payer: PRIVATE HEALTH INSURANCE

## 2018-01-08 DIAGNOSIS — E042 Nontoxic multinodular goiter: Secondary | ICD-10-CM

## 2018-01-08 DIAGNOSIS — D352 Benign neoplasm of pituitary gland: Secondary | ICD-10-CM | POA: Diagnosis not present

## 2018-01-09 LAB — T4, FREE: Free T4: 0.61 ng/dL (ref 0.60–1.60)

## 2018-01-09 LAB — PROLACTIN: Prolactin: 16.1 ng/mL (ref 4.8–23.3)

## 2018-01-09 LAB — TSH: TSH: 0.45 u[IU]/mL (ref 0.35–4.50)

## 2018-01-13 ENCOUNTER — Ambulatory Visit: Payer: BLUE CROSS/BLUE SHIELD | Admitting: Endocrinology

## 2018-01-13 ENCOUNTER — Telehealth: Payer: Self-pay | Admitting: Endocrinology

## 2018-01-13 DIAGNOSIS — Z87898 Personal history of other specified conditions: Secondary | ICD-10-CM

## 2018-01-13 DIAGNOSIS — Z0289 Encounter for other administrative examinations: Secondary | ICD-10-CM

## 2018-01-13 DIAGNOSIS — E221 Hyperprolactinemia: Secondary | ICD-10-CM

## 2018-01-13 MED ORDER — CABERGOLINE 0.5 MG PO TABS: ORAL_TABLET | ORAL | 1 refills | Status: DC

## 2018-01-13 NOTE — Telephone Encounter (Signed)
cabergoline (DOSTINEX) 0.5 MG tablet  Patient is needing a prescription sent into her pharmacy     [id number] - [place], [place]

## 2018-01-13 NOTE — Telephone Encounter (Signed)
Sent!

## 2018-01-16 ENCOUNTER — Ambulatory Visit: Payer: BLUE CROSS/BLUE SHIELD | Admitting: Nurse Practitioner

## 2018-02-12 ENCOUNTER — Ambulatory Visit: Payer: BLUE CROSS/BLUE SHIELD | Admitting: Gastroenterology

## 2018-03-11 ENCOUNTER — Ambulatory Visit: Payer: BLUE CROSS/BLUE SHIELD | Admitting: Nurse Practitioner

## 2018-03-12 ENCOUNTER — Ambulatory Visit (INDEPENDENT_AMBULATORY_CARE_PROVIDER_SITE_OTHER): Payer: PRIVATE HEALTH INSURANCE | Admitting: Gastroenterology

## 2018-03-12 ENCOUNTER — Telehealth: Payer: Self-pay

## 2018-03-12 ENCOUNTER — Encounter: Payer: Self-pay | Admitting: Gastroenterology

## 2018-03-12 VITALS — BP 98/66 | HR 88 | Ht 62.0 in | Wt 169.4 lb

## 2018-03-12 DIAGNOSIS — K5900 Constipation, unspecified: Secondary | ICD-10-CM | POA: Diagnosis not present

## 2018-03-12 DIAGNOSIS — G8929 Other chronic pain: Secondary | ICD-10-CM

## 2018-03-12 DIAGNOSIS — A048 Other specified bacterial intestinal infections: Secondary | ICD-10-CM | POA: Diagnosis not present

## 2018-03-12 DIAGNOSIS — R1013 Epigastric pain: Secondary | ICD-10-CM

## 2018-03-12 MED ORDER — BIS SUBCIT-METRONID-TETRACYC 140-125-125 MG PO CAPS: 3.0000 | ORAL_CAPSULE | Freq: Three times a day (TID) | ORAL | 0 refills | Status: DC

## 2018-03-12 MED ORDER — METRONIDAZOLE 250 MG PO TABS: 250.0000 mg | ORAL_TABLET | Freq: Four times a day (QID) | ORAL | 0 refills | Status: DC

## 2018-03-12 MED ORDER — BISMUTH SUBSALICYLATE 262 MG PO CHEW: 524.0000 mg | CHEWABLE_TABLET | Freq: Four times a day (QID) | ORAL | 0 refills | Status: DC

## 2018-03-12 MED ORDER — OMEPRAZOLE 40 MG PO CPDR: 40.0000 mg | DELAYED_RELEASE_CAPSULE | Freq: Two times a day (BID) | ORAL | 0 refills | Status: DC

## 2018-03-12 MED ORDER — FLUCONAZOLE 100 MG PO TABS: 100.0000 mg | ORAL_TABLET | Freq: Every day | ORAL | 0 refills | Status: AC

## 2018-03-12 MED ORDER — DOXYCYCLINE HYCLATE 100 MG PO TABS: 100.0000 mg | ORAL_TABLET | Freq: Two times a day (BID) | ORAL | 0 refills | Status: DC

## 2018-03-12 NOTE — Progress Notes (Signed)
History of Present Illness: This is a 42 year old female referred by Rory Percy, DO for the evaluation of epigastric pain constipation and occasional diarrhea.  She states she has had long-term problems with mild constipation with occasional episodes of diarrhea the symptoms have been present for many years and have not changed.  She has had difficulties with epigastric pain for several months and in the past.  She was recently placed on omeprazole with improvement in symptoms.  She states she had a colonoscopy performed around 2002 for what sounds like an infectious colitis with bloody diarrhea.  She relates 3 of her polyp removal at that time however she does not recall where the procedure was performed and we do not have records available at this time.  Review of prior EGD and colonoscopy in 2015 as below.  She states she was not treated for H. pylori infection. She relates that she moved from [place] shortly after the procedures and her primary care doctor changed around that time. She was not aware that H. pylori was diagnosed. Denies weight loss, change in stool caliber, melena, hematochezia, nausea, vomiting, dysphagia, reflux symptoms, chest pain.   EGD and colonoscopy in [place] in 06/2013. Colon to TI was normal except for internal hemorrhoids. EGD mild antral erythema. Biopsies showed H. pylori gastritis. Duodenal, colon and TI biopsies were unremarkable.    No Known Allergies Outpatient Medications Prior to Visit  Medication Sig Dispense Refill  . cabergoline (DOSTINEX) 0.5 MG tablet 1 tablet on Sundays and half on Thursdays 7 tablet 1  . fluticasone (FLONASE) 50 MCG/ACT nasal spray Place 2 sprays into both nostrils daily. 16 g 0  . omeprazole (PRILOSEC) 40 MG capsule Take 40 mg by mouth daily.     No facility-administered medications prior to visit.    Past Medical History:  Diagnosis Date  . Acid reflux   . Colon polyp   . History of seasonal allergies   .  Hypercholesterolemia   . Migraines   . Pituitary tumor   . Thyroid nodule   . Vaginal Pap smear, abnormal     Past Surgical History:  Procedure Laterality Date  . CESAREAN SECTION     Social History   Socioeconomic History  . Marital status: Single    Spouse name: Not on file  . Number of children: 1  . Years of education: Not on file  . Highest education level: Not on file  Occupational History  . Not on file  Social Needs  . Financial resource strain: Not on file  . Food insecurity:    Worry: Not on file    Inability: Not on file  . Transportation needs:    Medical: Not on file    Non-medical: Not on file  Tobacco Use  . Smoking status: Never Smoker  . Smokeless tobacco: Never Used  Substance and Sexual Activity  . Alcohol use: Yes    Frequency: Never    Comment: holidays  . Drug use: No  . Sexual activity: Yes    Birth control/protection: Condom  Lifestyle  . Physical activity:    Days per week: Not on file    Minutes per session: Not on file  . Stress: Not on file  Relationships  . Social connections:    Talks on phone: Not on file    Gets together: Not on file    Attends religious service: Not on file    Active member of club or organization: Not  on file    Attends meetings of clubs or organizations: Not on file    Relationship status: Not on file  Other Topics Concern  . Not on file  Social History Narrative  . Not on file   Family History  Problem Relation Age of Onset  . Hypertension Mother   . Hypercholesterolemia Mother   . Colon polyps Maternal Grandmother       Review of Systems: Pertinent positive and negative review of systems were noted in the above HPI section. All other review of systems were otherwise negative.   Physical Exam: General: Well developed, well nourished, no acute distress Head: Normocephalic and atraumatic Eyes:  sclerae anicteric, EOMI Ears: Normal auditory acuity Mouth: No deformity or lesions Neck: Supple, no  masses or thyromegaly Lungs: Clear throughout to auscultation Heart: Regular rate and rhythm; no murmurs, rubs or bruits Abdomen: Soft, non tender and non distended. No masses, hepatosplenomegaly or hernias noted. Normal Bowel sounds Rectal: Not done Musculoskeletal: Symmetrical with no gross deformities  Skin: No lesions on visible extremities Pulses:  Normal pulses noted Extremities: No clubbing, cyanosis, edema or deformities noted Neurological: Alert oriented x 4, grossly nonfocal Cervical Nodes:  No significant cervical adenopathy Inguinal Nodes: No significant inguinal adenopathy Psychological:  Alert and cooperative. Normal mood and affect   Assessment and Recommendations:  1.  Epigastric pain.  H. pylori gastritis diagnosed in 2015, apparently this has not been treated.  Pylera 3 p.o. 4 times daily for 14 days along with omeprazole 40 mg twice daily for 14 days.  If Pylera is too expensive will substitute the individual medications.  We discussed the importance of completing the entire therapy as prescribed for H. pylori eradication.  She requested oral Diflucan as she states she frequently has vaginal yeast infections when treated with antibiotics.  After completing above regimen she is advised to remain on omeprazole 40 mg daily.  REV in 4 to 6 weeks.  2.  Possible history of colon polyps in 2002.  She will attempt to determine where the colonoscopy is performed we will attempt to get records with pathology if available.  We will determine interval for next colonoscopy based on records.  If we are unable to obtain records consider a 5-year colonoscopy in February 2020 for her possible history of colon polyps versus a 10-year interval in February 2025.  3.  Chronic mild constipation alternating with mild diarrhea.  Avoid foods that exacerbate symptoms.  Currently symptoms are controlled with fiber supplements.  May add MiraLAX daily as needed if constipation worsens.  If her epigastric  pain does not resolve with the above therapy consider the addition of an antispasmodic for possible IBS.   cc: Rory Percy, DO [address] [place], [place] [id number]

## 2018-03-12 NOTE — Telephone Encounter (Signed)
Informed patient we received a rejection from the pharmacy for Pylera and will be sending 3 medications (Flagyl, doxycycline and pepto-bismol) in it's place. Informed patient to take omeprazole 40 mg twice daily along with these medications x 14 days. Patient verbalized understanding.

## 2018-03-12 NOTE — Patient Instructions (Signed)
We have sent the following medications to your pharmacy for you to pick up at your convenience: Pylera 3 capsules by mouth four times a day x 14 days along with omeprazole 40 mg twice daily x 14 days.  We have also sent omeprazole 40 mg daily to resume after your H. Pylori treatment and Diflucan to treat a yeast infection after treatment.   Normal BMI (Body Mass Index- based on height and weight) is between 19 and 25. Your BMI today is Body mass index is 30.98 kg/m. Marland Kitchen Please consider follow up  regarding your BMI with your Primary Care Provider.  Thank you for choosing me and [facility].  Pricilla Riffle. Dagoberto Ligas., MD., [facility]

## 2018-03-18 ENCOUNTER — Ambulatory Visit: Payer: BLUE CROSS/BLUE SHIELD | Admitting: Nurse Practitioner

## 2018-03-24 ENCOUNTER — Telehealth: Payer: Self-pay | Admitting: Gastroenterology

## 2018-03-24 NOTE — Telephone Encounter (Signed)
Left a message for patient to return my call. 

## 2018-03-24 NOTE — Telephone Encounter (Signed)
Informed patient of Dr. Lynne Leader recommendations to stop medications and take Benadryl as needed. Encouraged patient to see PCP if symptoms persist. Informed patient to continue omeprazole and keep follow up appt already scheduled with Dr. Fuller Plan on 04/22/18. Patient verbalized understanding.

## 2018-03-24 NOTE — Telephone Encounter (Signed)
Patient states she started treatment of Flagyl, doxycycline, pepto-bismol and omeprazole on 03/14/18 for treatment for H. Pylori. Patient states yesterday she started having redness,itching and burning on the palms of her hands. Patient states she took the medicine today also and now is having redness, itching and burning on her feet. Patient states she did take a Benadryl which helped but the burning came back immediately after a few hours. After going over dosage with patient, she admitted she only took the omeprazole the first day and has not been taking it every day during treatment. Informed patient to stop taking the medications and take another Benadryl if she has anymore redness or burning. Informed patient I will send message to Dr. Fuller Plan. Please advise.

## 2018-03-24 NOTE — Telephone Encounter (Signed)
Patient states that she is having a side effect from antibiotics states that hands and feet are burning. Should she stop taking them?

## 2018-03-24 NOTE — Telephone Encounter (Signed)
Agree with plan to discontinue doxycyline, flagyl and pepto-bismol however this might not be a medication reaction.  Given timing highly unlikely that omeprazole is the cause of symptoms so ok to take this med.  Benadryl 25 mg po tid as needed for 2 days. If symptoms do not resolve or worsen see PCP.

## 2018-03-27 ENCOUNTER — Ambulatory Visit (INDEPENDENT_AMBULATORY_CARE_PROVIDER_SITE_OTHER): Payer: PRIVATE HEALTH INSURANCE | Admitting: Endocrinology

## 2018-03-27 ENCOUNTER — Encounter: Payer: Self-pay | Admitting: Endocrinology

## 2018-03-27 VITALS — BP 118/64 | HR 64 | Ht 63.0 in | Wt 165.8 lb

## 2018-03-27 DIAGNOSIS — E042 Nontoxic multinodular goiter: Secondary | ICD-10-CM | POA: Diagnosis not present

## 2018-03-27 DIAGNOSIS — E221 Hyperprolactinemia: Secondary | ICD-10-CM | POA: Diagnosis not present

## 2018-03-27 NOTE — Progress Notes (Signed)
Patient ID: Kristin Daniels, female   DOB: [birth year], 42 y.o.   MRN: [id number]            Chief complaint: Endocrinology follow-up  History of Present Illness    PROBLEM 1:  Prolactinoma In 2009 she had presented to her physician with complaints of milky discharge from her breasts and headaches She had not had any late or missed menstrual cycles at that time She reportedly was diagnosed to have a prolactinoma and started on cabergoline She does not know what her baseline prolactin level was  She had been followed by an endocrinologist in [place] state with periodic lab work and MRIs and continued on cabergoline Only a few reports are available from previous records and not clear of her prolactin has been consistently controlled In 12/16 her prolactin was 45 About 6 months prior to her visit in December 2018 with her PCP she was told to start taking 1/2 tablet only once a week with her Dostinex  RECENT history: However late last year she started having some headaches and also breast fullness and mild galactorrhea When her prolactin level was 29 she was told to increase her dosage to half tablet twice a week  She was asymptomatic in 3/19 with minimal increase of her prolactin and none of the above symptoms She has been continued on 1/2 tablet twice a week  Currently she feels fairly good without any breast swelling, milky discharge or change in menstrual cycles  Prolactin levels:  Lab Results  Component Value Date   PROLACTIN 16.1 01/08/2018   PROLACTIN 24.3 (H) 07/09/2017   PROLACTIN 29.5 (H) 04/21/2017     MRI of pituitary gland as of 03/15/2016 shows a 7 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  The patient thinks that her left-sided nodule is a little larger but she has no local pressure or tenderness or difficulty swallowing  The following is a copy of the previous note:  She had this diagnosed on routine exam several years ago Patient herself had not noticed any swelling in her neck Also had not experienced any symptoms of local pressure, difficulty swallowing or choking  No formal ultrasound reports are available on review of previous records and no description of her exam is available from previous notes also Review of records indicate that she probably has had needle aspiration biopsies of the left dominant nodule 3 times Patient does not have recall of specific dates when she had the biopsies  Her first biopsy was done in 09/2011 which had shown indeterminate cytology and this was confirmed to be benign on Affirma testing At that time her nodule was 2.8 cm  Needle aspiration done in 01/2015 indicated she had a 3 cm nodule that previously had been benign; this showed scanty cellular specimen along with colloid and degenerated macrophages In 08/2016 the needle aspiration biopsy showed a benign follicular adenoma and the nodule size was indicated at 3.7 cm    Allergies as of 03/27/2018   No Known Allergies     Medication List        Accurate as of 03/27/18  4:58 PM. Always use your most  recent med list.          bismuth subsalicylate 528 MG chewable tablet Commonly known as:  PEPTO BISMOL Chew 2 tablets (524 mg total) by mouth 4 (four) times daily.   cabergoline 0.5 MG tablet Commonly known as:  DOSTINEX 1 tablet on Sundays and half on Thursdays   fluticasone 50 MCG/ACT nasal spray Commonly known as:  FLONASE Place 2 sprays into both nostrils daily.   metroNIDAZOLE 250 MG tablet Commonly known as:  FLAGYL Take 1 tablet (250 mg total)  by mouth 4 (four) times daily.   omeprazole 40 MG capsule Commonly known as:  PRILOSEC Take 1 capsule (40 mg total) by mouth 2 (two) times daily.       Allergies: No Known Allergies  Past Medical History:  Diagnosis Date  . Acid reflux   . Colon polyp   . H. pylori infection   . History of seasonal allergies   . Hypercholesterolemia   . Migraines   . Pituitary tumor   . Thyroid nodule   . Vaginal Pap smear, abnormal     Past Surgical History:  Procedure Laterality Date  . CESAREAN SECTION      Family History  Problem Relation Age of Onset  . Hypertension Mother   . Hypercholesterolemia Mother   . Colon polyps Maternal Grandmother     Social History:  reports that she has never smoked. She has never used smokeless tobacco. She reports that she drinks alcohol. She reports that she does not use drugs.   Review of Systems  EXAM:  BP 118/64   Pulse 64   Ht 5\' 3"  (1.6 m)   Wt 165 lb 12.8 oz (75.2 kg)   SpO2 98%   BMI 29.37 kg/m   Physical Exam   Right lower thyroid is showing fullness with soft texture She has a about 4 centimeter left-sided thyroid nodule which is smooth and firm This measures about 3.5 cm across also No lymphadenopathy   ASSESSMENT:   Prolactinoma since 2009 with history of sellar mass that was heterogenous and 7 mm in original size and presenting mostly with headaches and galactorrhea.   Her symptoms are controlled her prolactin is back to normal with taking cabergoline half tablet twice a week    Multinodular goiter, with a confirmed benign left-sided nodule  This appears to be similar in size to her previous exam similar to her size indicated in previous ultrasound She is euthyroid with low normal TSH   PLAN:  She will continue the same dose of cabergoline twice a week  Reassured her that she does not need any intervention for her left thyroid nodule which feels the same on exam and has been benign No indication for  surgery for this and would not benefit from thyroid suppression also   Elayne Snare 03/27/18

## 2018-04-09 ENCOUNTER — Other Ambulatory Visit: Payer: Self-pay | Admitting: Gastroenterology

## 2018-04-21 ENCOUNTER — Encounter: Payer: Self-pay | Admitting: Advanced Practice Midwife

## 2018-04-21 ENCOUNTER — Ambulatory Visit (INDEPENDENT_AMBULATORY_CARE_PROVIDER_SITE_OTHER): Payer: PRIVATE HEALTH INSURANCE | Admitting: Advanced Practice Midwife

## 2018-04-21 ENCOUNTER — Other Ambulatory Visit (HOSPITAL_COMMUNITY)
Admission: RE | Admit: 2018-04-21 | Discharge: 2018-04-21 | Disposition: A | Discharge status: Home / Self Care | Payer: PRIVATE HEALTH INSURANCE | Source: Ambulatory Visit | Attending: Nurse Practitioner | Admitting: Nurse Practitioner

## 2018-04-21 VITALS — BP 106/73 | HR 106 | Ht 63.0 in | Wt 165.8 lb

## 2018-04-21 DIAGNOSIS — N898 Other specified noninflammatory disorders of vagina: Secondary | ICD-10-CM | POA: Insufficient documentation

## 2018-04-21 DIAGNOSIS — Z01419 Encounter for gynecological examination (general) (routine) without abnormal findings: Secondary | ICD-10-CM

## 2018-04-21 DIAGNOSIS — R102 Pelvic and perineal pain: Secondary | ICD-10-CM | POA: Diagnosis not present

## 2018-04-21 NOTE — Patient Instructions (Signed)

## 2018-04-21 NOTE — Progress Notes (Signed)
GYNECOLOGY ANNUAL PREVENTATIVE CARE ENCOUNTER NOTE  Subjective:   Kristin Daniels is a 42 y.o. [phone] female here for a routine annual gynecologic exam.  Current complaints: discharge and pelvic pain. She states that this is more "stomach pain". She was recently dx with H. Pylori and completed the antibiotics. SHe has a FU with her GI doctor coming up, and she thinks that her pain is mostly GI related because it is all over her stomach. She states that her GI doctor also gave her diflucan her a yeast infection that she got after taking antibiotics. She states that the discharge seem to be a lot better.    Gynecologic History Patient's last menstrual period was 04/15/2018. Just ended 04/20/18  Contraception: condoms, patient is happy with condoms at this time. She has a pituitary tumor and does not want any more hormones.  Last Pap: 03/14/2017. Results were: NIL, HPV negative Last mammogram: 05/21/2017. Results were: normal  Obstetric History OB History  Gravida Para Term Preterm AB Living  2 1   1 1 1   SAB TAB Ectopic Multiple Live Births  1       1    # Outcome Date GA Lbr Len/2nd Weight Sex Delivery Anes PTL Lv  2 Preterm 2011 [redacted]w[redacted]d  7 lb 2 oz (3.232 kg) M CS-LTranv EPI N LIV  1 SAB 1995            Past Medical History:  Diagnosis Date  . Acid reflux   . Colon polyp   . H. pylori infection   . History of seasonal allergies   . Hypercholesterolemia   . Migraines   . Pituitary tumor   . Thyroid nodule   . Vaginal Pap smear, abnormal     Past Surgical History:  Procedure Laterality Date  . CESAREAN SECTION      Current Outpatient Medications on File Prior to Visit  Medication Sig Dispense Refill  . cabergoline (DOSTINEX) 0.5 MG tablet 1 tablet on Sundays and half on Thursdays 7 tablet 1  . fluticasone (FLONASE) 50 MCG/ACT nasal spray Place 2 sprays into both nostrils daily. 16 g 0  . omeprazole (PRILOSEC) 40 MG capsule Take 1 capsule (40 mg total) by mouth daily. 30 capsule  1  . bismuth subsalicylate (PEPTO-BISMOL) 262 MG chewable tablet Chew 2 tablets (524 mg total) by mouth 4 (four) times daily. 112 tablet 0  . metroNIDAZOLE (FLAGYL) 250 MG tablet Take 1 tablet (250 mg total) by mouth 4 (four) times daily. 56 tablet 0   No current facility-administered medications on file prior to visit.     No Known Allergies  Social History   Socioeconomic History  . Marital status: Single    Spouse name: Not on file  . Number of children: 1  . Years of education: Not on file  . Highest education level: Not on file  Occupational History  . Not on file  Social Needs  . Financial resource strain: Not on file  . Food insecurity:    Worry: Not on file    Inability: Not on file  . Transportation needs:    Medical: Not on file    Non-medical: Not on file  Tobacco Use  . Smoking status: Never Smoker  . Smokeless tobacco: Never Used  Substance and Sexual Activity  . Alcohol use: Yes    Frequency: Never    Comment: holidays  . Drug use: No  . Sexual activity: Yes    Birth control/protection: Condom  Lifestyle  .  Physical activity:    Days per week: Not on file    Minutes per session: Not on file  . Stress: Not on file  Relationships  . Social connections:    Talks on phone: Not on file    Gets together: Not on file    Attends religious service: Not on file    Active member of club or organization: Not on file    Attends meetings of clubs or organizations: Not on file    Relationship status: Not on file  . Intimate partner violence:    Fear of current or ex partner: Not on file    Emotionally abused: Not on file    Physically abused: Not on file    Forced sexual activity: Not on file  Other Topics Concern  . Not on file  Social History Narrative  . Not on file    Family History  Problem Relation Age of Onset  . Hypertension Mother   . Hypercholesterolemia Mother   . Colon polyps Maternal Grandmother     The following portions of the  patient's history were reviewed and updated as appropriate: allergies, current medications, past family history, past medical history, past social history, past surgical history and problem list.  Review of Systems Pertinent items noted in HPI and remainder of comprehensive ROS otherwise negative.   Objective:  BP 106/73   Pulse (!) 106   Ht 5\' 3"  (1.6 m)   Wt 165 lb 12.8 oz (75.2 kg)   LMP 04/15/2018   BMI 29.37 kg/m  CONSTITUTIONAL: Well-developed, well-nourished female in no acute distress.  HENT:  Normocephalic, atraumatic, External right and left ear normal. Oropharynx is clear and moist EYES: Conjunctivae and EOM are normal. Pupils are equal, round, and reactive to light. No scleral icterus.  NECK: Normal range of motion, supple  SKIN: Skin is warm and dry. No rash noted. Not diaphoretic. No erythema. No pallor. NEUROLOGIC: Alert and oriented to person, place, and time. Normal reflexes, muscle tone coordination. No cranial nerve deficit noted. PSYCHIATRIC: Normal mood and affect. Normal behavior. Normal judgment and thought content. CARDIOVASCULAR: Normal heart rate noted, regular rhythm RESPIRATORY: Clear to auscultation bilaterally. Effort and breath sounds normal, no problems with respiration noted. BREASTS: Symmetric in size. No masses, skin changes, nipple drainage, or lymphadenopathy. ABDOMEN: Soft, normal bowel sounds, no distention noted.  No tenderness, rebound or guarding.  PELVIC: Normal appearing external genitalia; normal appearing vaginal mucosa and cervix.  No abnormal discharge noted.  Pap smear obtained.  Normal uterine size, no other palpable masses, no uterine or adnexal tenderness. MUSCULOSKELETAL: Normal range of motion. No tenderness.  No cyanosis, clubbing, or edema.  2+ distal pulses.   Assessment and Plan:  1. Well woman exam with routine gynecological exam - MM Digital Screening; Future - Patient wishes to schedule this on her own.   2. Vaginal  discharge - Cervicovaginal ancillary only( [place])  3. Pelvic pain in female - Patient will FU with GI if he feels her H. Pylori has resolved and the pain is still there then we can get a pelvic US.  - Patient to send my chart message, and we will coordinate an [place].   Will follow up results of pap smear and manage accordingly. Mammogram scheduled Routine preventative health maintenance measures emphasized. Please refer to After Visit Summary for other counseling recommendations.   Marcille Buffy DNP, CNM  4:51 PM 04/21/18

## 2018-04-22 ENCOUNTER — Ambulatory Visit (INDEPENDENT_AMBULATORY_CARE_PROVIDER_SITE_OTHER): Payer: PRIVATE HEALTH INSURANCE | Admitting: Gastroenterology

## 2018-04-22 ENCOUNTER — Encounter: Payer: Self-pay | Admitting: Gastroenterology

## 2018-04-22 VITALS — BP 124/74 | HR 76 | Ht 62.5 in | Wt 169.1 lb

## 2018-04-22 DIAGNOSIS — A048 Other specified bacterial intestinal infections: Secondary | ICD-10-CM | POA: Diagnosis not present

## 2018-04-22 DIAGNOSIS — R140 Abdominal distension (gaseous): Secondary | ICD-10-CM

## 2018-04-22 DIAGNOSIS — R1013 Epigastric pain: Secondary | ICD-10-CM

## 2018-04-22 MED ORDER — DICYCLOMINE HCL 10 MG PO CAPS: 10.0000 mg | ORAL_CAPSULE | Freq: Three times a day (TID) | ORAL | 11 refills | Status: DC

## 2018-04-22 NOTE — Patient Instructions (Addendum)
Please take your lab requisition to [facility] on [address] in [place] when you are ready to complete your H. Pylori breath test. You must be off your PPI (omeprazole) x 2 weeks before you can complete this test.   We have sent the following medications to your pharmacy for you to pick up at your convenience: dicyclomine.   You can also take over the counter Gas-X three times a day for gas and bloating.   Thank you for choosing me and [facility].  Pricilla Riffle. Dagoberto Ligas., MD., Marval Regal

## 2018-04-22 NOTE — Progress Notes (Signed)
    History of Present Illness: This is a 42 year old female returning for follow-up of epigastric pain, abdominal bloating, gas and H. pylori.  She completed completed 11 days of the antibiotic regimen but noted redness and itching and burning on her palms and therefore antibiotics were discontinued at that point.  Due to her her misunderstanding she did not take a PPI at all during her course of antibiotics as she thought it would interfere with the other medications.   Current Medications, Allergies, Past Medical History, Past Surgical History, Family History and Social History were reviewed in [employer] record.  Physical Exam: General: Well developed, well nourished, no acute distress Head: Normocephalic and atraumatic Eyes:  sclerae anicteric, EOMI Ears: Normal auditory acuity Mouth: No deformity or lesions Lungs: Clear throughout to auscultation Heart: Regular rate and rhythm; no murmurs, rubs or bruits Abdomen: Soft, non tender and non distended. No masses, hepatosplenomegaly or hernias noted. Normal Bowel sounds Rectal: Not done Musculoskeletal: Symmetrical with no gross deformities  Pulses:  Normal pulses noted Extremities: No clubbing, cyanosis, edema or deformities noted Neurological: Alert oriented x 4, grossly nonfocal Psychological:  Alert and cooperative. Normal mood and affect   Assessment and Recommendations:  1.  Epigastric pain, bloating and gas. Partial treatment for H pylori was completed however I am not certain this will be effective without use of a PPI. He GI symptoms persist.  Schedule H pylori breath test off PPI for 2 weeks.  Begin dicyclomine 10 mg 3 times daily and Gas-X 3 times daily.

## 2018-04-23 LAB — CERVICOVAGINAL ANCILLARY ONLY
BACTERIAL VAGINITIS: NEGATIVE
CANDIDA VAGINITIS: NEGATIVE
Chlamydia: NEGATIVE
Neisseria Gonorrhea: NEGATIVE
Trichomonas: NEGATIVE

## 2018-05-11 ENCOUNTER — Other Ambulatory Visit: Payer: Self-pay | Admitting: Gastroenterology

## 2018-05-12 LAB — H. PYLORI BREATH TEST: H. pylori Breath Test: NOT DETECTED

## 2018-05-18 ENCOUNTER — Telehealth: Payer: Self-pay | Admitting: Gastroenterology

## 2018-05-18 NOTE — Telephone Encounter (Signed)
Called [facility] to request H. Pylori breath test results. They state they will fax the results to our office.

## 2018-05-18 NOTE — Telephone Encounter (Signed)
Estill Bamberg, is this the lab you were looking for the other day?

## 2018-05-18 NOTE — Telephone Encounter (Signed)
Results put on Dr. Lynne Leader desk for review.

## 2018-05-18 NOTE — Telephone Encounter (Signed)
Pt called to inform that she had H-pylori breath test on [facility]/11/[facility] at [facility] but was informed that order had incorrect codes.

## 2018-05-19 NOTE — Telephone Encounter (Signed)
Results of breath test are negative/not detected. Patient notified of H. Pylori breath test results. Patient states she is still having issues with abdominal pain. Asked patient if she is taking the bentyl that was prescribed by Dr. Fuller Plan. Patient states she has not taken it very much. Informed patient to try taking the bentyl three times a day prescribed by the doctor and to call back if she is still having pain after that. Patient verbalized understanding.

## 2018-05-21 ENCOUNTER — Other Ambulatory Visit: Payer: Self-pay | Admitting: Endocrinology

## 2018-05-21 DIAGNOSIS — E221 Hyperprolactinemia: Secondary | ICD-10-CM

## 2018-05-21 DIAGNOSIS — Z87898 Personal history of other specified conditions: Secondary | ICD-10-CM

## 2018-08-12 ENCOUNTER — Telehealth: Payer: Self-pay | Admitting: Family Medicine

## 2018-08-12 ENCOUNTER — Other Ambulatory Visit: Payer: Self-pay | Admitting: Family Medicine

## 2018-08-12 DIAGNOSIS — J069 Acute upper respiratory infection, unspecified: Secondary | ICD-10-CM

## 2018-08-12 MED ORDER — FLUTICASONE PROPIONATE 50 MCG/ACT NA SUSP: 2.0000 | Freq: Every day | NASAL | 3 refills | Status: DC

## 2018-08-12 NOTE — Telephone Encounter (Signed)
Was on a telemedicine visit with this patient's son and patient requested to be seen in clinic for her annual.  Per chart review, her last women's health exam was done in December 2019.  She established care in August 2019 and is not yet due for her annual visit.  She does request refills on medications. She is aware that she will need to call her gastroenterologist will need to refill her omeprazole as she was seen previously for H. pylori and it is unclear whether GI wanted to continue this medication.  She is also aware that she needs to call her endocrinologist for the cabergoline refill.    Will refill Flonase.  Zettie Cooley, M.D.  Family Medicine  PGY-1 08/12/2018 4:00 PM

## 2018-08-24 ENCOUNTER — Telehealth: Payer: PRIVATE HEALTH INSURANCE | Admitting: Family

## 2018-08-24 ENCOUNTER — Other Ambulatory Visit: Payer: Self-pay | Admitting: Endocrinology

## 2018-08-24 DIAGNOSIS — E221 Hyperprolactinemia: Secondary | ICD-10-CM

## 2018-08-24 DIAGNOSIS — Z20822 Contact with and (suspected) exposure to covid-19: Secondary | ICD-10-CM

## 2018-08-24 DIAGNOSIS — R6889 Other general symptoms and signs: Secondary | ICD-10-CM | POA: Diagnosis not present

## 2018-08-24 DIAGNOSIS — Z87898 Personal history of other specified conditions: Secondary | ICD-10-CM

## 2018-08-24 NOTE — Progress Notes (Signed)
E-Visit for Corona Virus Screening  Based on your current symptoms, it seems unlikely that your symptoms are related to the Coronavirus.    Approximately 5 minutes was spent documenting and reviewing patient's chart.   Call pt about chest pain. States she is having whole body pains.   Coronavirus disease 2019 (COVID-19) is a respiratory illness that can spread from person to person. The virus that causes COVID-19 is a new virus that was first identified in the country of [place] but is now found in multiple other countries and has spread to the [place].  Symptoms associated with the virus are mild to severe fever, cough, and shortness of breath. There is currently no vaccine to protect against COVID-19, and there is no specific antiviral treatment for the virus.   To be considered HIGH RISK for Coronavirus (COVID-19), you have to meet the following criteria:  . Traveled to [place], [place], [place], [place] or [place]; or in the [place] to [place], [place], [place], or [place]; and have fever, cough, and shortness of breath within the last 2 weeks of travel OR  . Been in close contact with a person diagnosed with COVID-19 within the last 2 weeks and have fever, cough, and shortness of breath  . IF YOU DO NOT MEET THESE CRITERIA, YOU ARE CONSIDERED LOW RISK FOR COVID-19.   It is vitally important that if you feel that you have an infection such as this virus or any other virus that you stay home and away from places where you may spread it to others.  You should self-quarantine for 14 days if you have symptoms that could potentially be coronavirus and avoid contact with people age 59 and older.     You may also take acetaminophen (Tylenol) as needed for fever.   Reduce your risk of any infection by using the same precautions used for avoiding the common cold or flu:  Marland Kitchen Wash your hands often with soap and warm water for at least 20 seconds.  If soap and water are not readily  available, use an alcohol-based hand sanitizer with at least 60% alcohol.  . If coughing or sneezing, cover your mouth and nose by coughing or sneezing into the elbow areas of your shirt or coat, into a tissue or into your sleeve (not your hands). . Avoid shaking hands with others and consider head nods or verbal greetings only. . Avoid touching your eyes, nose, or mouth with unwashed hands.  . Avoid close contact with people who are sick. . Avoid places or events with large numbers of people in one location, like concerts or sporting events. . Carefully consider travel plans you have or are making. . If you are planning any travel outside or inside the [place], visit the CDC's Travelers' Health webpage for the latest health notices. . If you have some symptoms but not all symptoms, continue to monitor at home and seek medical attention if your symptoms worsen. . If you are having a medical emergency, call 911.  HOME CARE . Only take medications as instructed by your medical team. . Drink plenty of fluids and get plenty of rest. . A steam or ultrasonic humidifier can help if you have congestion.   GET HELP RIGHT AWAY IF: . You develop worsening fever. . You become short of breath . You cough up blood. . Your symptoms become more severe MAKE SURE YOU   Understand these instructions.  Will watch your condition.  Will get  help right away if you are not doing well or get worse.  Your e-visit answers were reviewed by a board certified advanced clinical practitioner to complete your personal care plan.  Depending on the condition, your plan could have included both over the counter or prescription medications.  If there is a problem please reply once you have received a response from your provider. Your safety is important to [place].  If you have drug allergies check your prescription carefully.    You can use MyChart to ask questions about today's visit, request a non-urgent call back, or ask for a  work or school excuse for 24 hours related to this e-Visit. If it has been greater than 24 hours you will need to follow up with your provider, or enter a new e-Visit to address those concerns. You will get an e-mail in the next two days asking about your experience.  I hope that your e-visit has been valuable and will speed your recovery. Thank you for using e-visits.

## 2018-08-25 ENCOUNTER — Telehealth: Payer: Self-pay | Admitting: Family Medicine

## 2018-08-25 ENCOUNTER — Telehealth (INDEPENDENT_AMBULATORY_CARE_PROVIDER_SITE_OTHER): Payer: PRIVATE HEALTH INSURANCE | Admitting: Family Medicine

## 2018-08-25 ENCOUNTER — Other Ambulatory Visit: Payer: Self-pay

## 2018-08-25 DIAGNOSIS — K580 Irritable bowel syndrome with diarrhea: Secondary | ICD-10-CM | POA: Diagnosis not present

## 2018-08-25 DIAGNOSIS — M62838 Other muscle spasm: Secondary | ICD-10-CM

## 2018-08-25 MED ORDER — CYCLOBENZAPRINE HCL 5 MG PO TABS: 5.0000 mg | ORAL_TABLET | Freq: Three times a day (TID) | ORAL | 0 refills | Status: DC | PRN

## 2018-08-25 NOTE — Progress Notes (Signed)
[place] Telemedicine Visit  Patient consented to have virtual visit and discussed that this will get billed to her insurer. Method of visit: Video was attempted, but technology challenges prevented patient from using video, so visit was conducted via telephone.  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 Pylori (s/p treatment), thyroid nodules, and GERD presenting via telemedicine visit for concern for muscle cramps.   Yesterday, the patient reported the following symptoms: - Back pain, leg pain  - Headache - No other symptoms. Today, her most worrisome symptoms today are back spasms (muscle cramps as she describes them) which radiate down her bilateral legs and stomach pain. She endorses a mild headache. She speficially denies the following: fevers, chills, nausea, vomiting, chest pain, difficulty breathing, cough, congestion or sneezing. She is concerned she has COVID because her son had new onset seizures at the beginning of April. She has been reading online that [place] can present as a neurological issue in the seriously ill. She is most worried she may develop meningitis, MS, or ALS from her current illness.   In regards to her headaches, she has chronic daily headaches which she has attributed to her prolactinoma and tension headaches in the past. Last MRI (2017) did not show any change in size. No change in severity/frequency of headaches. No vision changes. She has a history of chronic back pain due to degenerative disc disease, diagnosed and treated in [place]. She has a history of IBS-mixed type, recently seen by Gastroenterology.   The patient has a history of thyroid nodules. She requests a referral to ENT. Denies  difficulty swallowing, speaking, or change in size of thyroid (she palpates this regularly she reports).   She does not smoke, does not use alcohol. Sexually active with 1 partner, uses only condoms.    ROS: per HPI  Pertinent PMHx:  Prolactinoma, follows with Endo Thyroid nodules IBS-mixed H. Pylori- treated  Exam:  Respiratory: Speaking in full sentences, breathing comfortably   Assessment/Plan: Diagnoses and all orders for this visit:  Muscle spasm, low suspicion that patient has COVID19 causing this but given uncertainty about prevalence in community, will give precautions, instructed to stay at home for 7 days after start of symptoms, must be asymptomatic 3 days off of medication before returning to community. Advised family to self-isolate as well. She has no fever, LRI or URI symptoms suggestive of COVID19 and I have a stronger suspicion this is related to illness anxiety and her chronic medical conditions. Recommended Tylenol TID, ice, heat, stretching, cyclobenzaprine.  -     cyclobenzaprine (FLEXERIL) 5 MG tablet; Take 1 tablet (5 mg total) by mouth 3 (three) times daily as needed for muscle spasms.  Irritable bowel syndrome with diarrhea and constipation, patient has had worsening abdominal cramping, diarrhea for 2 days (last week). Discussed dietary strategies, recommended taking 1-2 tablets of Bentyl (NOT with muscle relaxer).   Contraception, discussed efficacy of condoms for contraception, discussed small risk of pregnancy, recommend continued discussion.  Thyroid Nodules, as this is a long standing problem, recommended evaluation in the office for these and referral as appropriate. Patient to call in late May/June for in-person appointment (should be at least 2 weeks from onset of symptoms above).   Time spent during visit  with patient: 21 minutes

## 2018-08-25 NOTE — Telephone Encounter (Signed)
Pt left a voicemail asking for a referral to ENT. She is having sinus issues, throat issues and other symptoms. jw

## 2018-08-26 NOTE — Telephone Encounter (Signed)
Looking back at her chart, it appears these symptoms are new and does not have chronic issues with sinuses. She needs to be evaluated either with UC or through telemedicine visit prior to referral, especially given current coronavirus pandemic.

## 2018-08-27 NOTE — Telephone Encounter (Signed)
LMV for pt to call us back to make a telemedicine appt. Salvatore Marvel, CMA

## 2018-08-28 NOTE — Telephone Encounter (Signed)
Spoke with pt she stated that she has already had a telemedicine appt. Pt stated she is doing fine. Salvatore Marvel, CMA

## 2018-08-30 ENCOUNTER — Other Ambulatory Visit: Payer: Self-pay | Admitting: Gastroenterology

## 2018-09-08 ENCOUNTER — Other Ambulatory Visit: Payer: Self-pay

## 2018-09-08 ENCOUNTER — Other Ambulatory Visit (INDEPENDENT_AMBULATORY_CARE_PROVIDER_SITE_OTHER): Payer: PRIVATE HEALTH INSURANCE

## 2018-09-08 DIAGNOSIS — E042 Nontoxic multinodular goiter: Secondary | ICD-10-CM

## 2018-09-08 DIAGNOSIS — E221 Hyperprolactinemia: Secondary | ICD-10-CM | POA: Diagnosis not present

## 2018-09-08 LAB — T3, FREE: T3, Free: 3.6 pg/mL (ref 2.3–4.2)

## 2018-09-08 LAB — T4, FREE: Free T4: 0.71 ng/dL (ref 0.60–1.60)

## 2018-09-08 LAB — TSH: TSH: 0.52 u[IU]/mL (ref 0.35–4.50)

## 2018-09-09 LAB — PROLACTIN: Prolactin: 11.7 ng/mL (ref 4.8–23.3)

## 2018-09-14 ENCOUNTER — Ambulatory Visit (INDEPENDENT_AMBULATORY_CARE_PROVIDER_SITE_OTHER): Payer: PRIVATE HEALTH INSURANCE | Admitting: Endocrinology

## 2018-09-14 ENCOUNTER — Other Ambulatory Visit: Payer: Self-pay

## 2018-09-14 ENCOUNTER — Encounter: Payer: Self-pay | Admitting: Endocrinology

## 2018-09-14 VITALS — BP 102/80 | HR 110 | Ht 62.5 in | Wt 169.2 lb

## 2018-09-14 DIAGNOSIS — E221 Hyperprolactinemia: Secondary | ICD-10-CM

## 2018-09-14 DIAGNOSIS — Z87898 Personal history of other specified conditions: Secondary | ICD-10-CM

## 2018-09-14 DIAGNOSIS — E042 Nontoxic multinodular goiter: Secondary | ICD-10-CM | POA: Diagnosis not present

## 2018-09-14 MED ORDER — CABERGOLINE 0.5 MG PO TABS: 0.5000 mg | ORAL_TABLET | ORAL | 1 refills | Status: DC

## 2018-09-14 NOTE — Progress Notes (Signed)
Patient ID: Kristin Daniels, female   DOB: 11/06/1975, 43 y.o.   MRN: [id number]            Chief complaint: Endocrinology follow-up  History of Present Illness    PROBLEM 1:  Prolactinoma In 2009 she had presented to her physician with complaints of milky discharge from her breasts and headaches She had not had any late or missed menstrual cycles at that time She reportedly was diagnosed to have a prolactinoma and started on cabergoline She does not know what her baseline prolactin level was  She had been followed by an endocrinologist in [place] state with periodic lab work and MRIs and continued on cabergoline Only a few reports are available from previous records and not clear of her prolactin has been consistently controlled In 12/16 her prolactin was 45 About 6 months prior to her visit in December 2018 with her PCP she was told to start taking 1/2 tablet only once a week with her Dostinex  RECENT history: In late 2018 started having some headaches and also breast fullness and mild galactorrhea When her prolactin level was 29 she was told to increase her dosage to half tablet twice a week Subsequently symptoms are improved and her prolactin is back to normal  She has been continued on 1/2 tablet twice a week  Currently she feels fairly good without any breast swelling, milky discharge or change in menstrual cycles  Prolactin levels:  Lab Results  Component Value Date   PROLACTIN 11.7 09/08/2018   PROLACTIN 16.1 01/08/2018   PROLACTIN 24.3 (H) 07/09/2017   PROLACTIN 29.5 (H) 04/21/2017     MRI of pituitary gland as of 03/15/2016 shows a 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  She had this diagnosed on routine exam several years ago  No formal ultrasound reports are available on review of previous records and no description of her exam is available from previous notes also Review of records indicate that she probably has had needle aspiration biopsies of the left dominant nodule 3 times Patient does not have recall of specific dates when she had the biopsies  Patient initially had not noticed any swelling in her neck Recently she does not think the thyroid swelling is any bigger However she is feeling a small nodule in the left upper neck for the last several months and is only now mentioning this  No recent symptoms of local pressure sensation, difficulty swallowing or choking  Her thyroid levels have been consistently normal  Previous studies:   Her first biopsy was done in 09/2011 which had shown indeterminate cytology and this was confirmed to be benign on [facility] testing At that time her nodule was 2.8 cm  Needle aspiration done in 01/2015 indicated she had a 3 cm nodule that previously had been benign; this showed scanty cellular specimen along with colloid and degenerated macrophages In 08/2016 the needle aspiration biopsy showed a benign follicular adenoma and the nodule size was indicated at 3.7 cm  Lab Results  Component Value Date   TSH 0.52 09/08/2018   TSH 0.45 01/08/2018   TSH 0.553 04/21/2017   FREET4 0.71  09/08/2018   FREET4 0.61 01/08/2018     Allergies as of 09/14/2018   No Known Allergies     Medication List       Accurate as of Sep 14, 2018  3:33 PM. If you have any questions, ask your nurse or doctor.        STOP taking these medications   cyclobenzaprine 5 MG tablet Commonly known as:  FLEXERIL Stopped by:  Elayne Snare, MD   dicyclomine 10 MG capsule Commonly known as:  BENTYL Stopped by:  Elayne Snare, MD      TAKE these medications   cabergoline 0.5 MG tablet Commonly known as:  DOSTINEX TAKE 1 TABLET ON SUNDAYS AND HALF ON THURSDAYS What changed:  See the new instructions.   fluticasone 50 MCG/ACT nasal spray Commonly known as:  FLONASE Place 2 sprays into both nostrils daily. What changed:    when to take this  reasons to take this   omeprazole 40 MG capsule Commonly known as:  PRILOSEC TAKE 1 CAPSULE BY MOUTH EVERY DAY What changed:    how much to take  when to take this  reasons to take this       Allergies: No Known Allergies  Past Medical History:  Diagnosis Date  . Acid reflux   . Colon polyp   . H. pylori infection   . History of seasonal allergies   . Hypercholesterolemia   . Migraines   . Pituitary tumor   . Thyroid nodule   . Vaginal Pap smear, abnormal     Past Surgical History:  Procedure Laterality Date  . CESAREAN SECTION      Family History  Problem Relation Age of Onset  . Hypertension Mother   . Hypercholesterolemia Mother   . Colon polyps Maternal Grandmother     Social History:  reports that she has never smoked. She has never used smokeless tobacco. She reports current alcohol use. She reports that she does not use drugs.   Review of Systems  EXAM:  BP 102/80 (BP Location: Left Arm, Patient Position: Sitting, Cuff Size: Normal)   Pulse (!) 110   Ht 5' 2.5" (1.588 m)   Wt 169 lb 3.2 oz (76.7 kg)   SpO2 97%   BMI 30.45 kg/m   Physical Exam  Her left thyroid nodule is about 3-3.5 cm, rounded, smooth, slightly firm Right lobe is not clearly palpable  She has about a 1 cm submandibular lymph node on the left side which is nontender No other lymph nodes palpable  ASSESSMENT:   Prolactinoma since 2009 with history of sellar mass that was heterogenous and 7 mm in original size and presenting with headaches and galactorrhea.   Her symptoms are controlled consistently, currently taking cabergoline half tablet twice a week  No oligomenorrhea Recent prolactin is also quite normal under 12   Multinodular goiter, with a longstanding benign left-sided nodule  This appears to be probably smaller in size to her previous exam  She is euthyroid with normal TSH  Probable 1 cm left submandibular lymph node: This is located in the left upper neck, likely a submandibular lymph node This is not new as per the patient Explained to the patient that this is not part of her thyroid and since she has noticed it for several months likely to be benign   PLAN:  She will continue half tablet cabergoline twice a week  Reassured her that she does not need a repeat ultrasound of the thyroid since  her left thyroid nodule has been benign on previous biopsies and is clinically about the same or smaller compared to her previous exam   She will need to follow-up with her PCP for further evaluation   Elayne Snare 09/14/18

## 2018-09-15 ENCOUNTER — Other Ambulatory Visit: Payer: PRIVATE HEALTH INSURANCE

## 2018-09-21 ENCOUNTER — Other Ambulatory Visit: Payer: Self-pay

## 2018-09-21 ENCOUNTER — Ambulatory Visit (INDEPENDENT_AMBULATORY_CARE_PROVIDER_SITE_OTHER): Payer: PRIVATE HEALTH INSURANCE | Admitting: Family Medicine

## 2018-09-21 ENCOUNTER — Encounter: Payer: Self-pay | Admitting: Family Medicine

## 2018-09-21 VITALS — BP 110/68 | HR 110

## 2018-09-21 DIAGNOSIS — H00014 Hordeolum externum left upper eyelid: Secondary | ICD-10-CM

## 2018-09-21 DIAGNOSIS — R0989 Other specified symptoms and signs involving the circulatory and respiratory systems: Secondary | ICD-10-CM | POA: Diagnosis not present

## 2018-09-21 DIAGNOSIS — Z8669 Personal history of other diseases of the nervous system and sense organs: Secondary | ICD-10-CM

## 2018-09-21 DIAGNOSIS — H00019 Hordeolum externum unspecified eye, unspecified eyelid: Secondary | ICD-10-CM | POA: Insufficient documentation

## 2018-09-21 HISTORY — DX: Other specified symptoms and signs involving the circulatory and respiratory systems: R09.89

## 2018-09-21 MED ORDER — OLOPATADINE HCL 0.2 % OP SOLN: 1.0000 [drp] | Freq: Every day | OPHTHALMIC | 0 refills | Status: AC

## 2018-09-21 NOTE — Assessment & Plan Note (Addendum)
Palpated pea-sized lymph node along L side of trachea, non-tender to palpation. No red flags on exam or through history. No current indication for treatment. Reassurance provided, will monitor.

## 2018-09-21 NOTE — Assessment & Plan Note (Signed)
Slight swelling with exquisite tenderness to light palpation with erythema to underside of L top lid most consistent with internal stye without discharge, purulence. No crusting to lashes to suggest blepharitis. No vision changes, sclera normal, EOMI, no concern for intraocular pathology or orbital cellulitis. Advised warm compresses. Gave pataday drops for itching and irritation. Antibiotic treatment not indicated at this time as is likely sterile inflammation although patient remained adamant about wanting to receive antibiotic drops after discussion, referral to ophthalmology placed per patient preference.

## 2018-09-21 NOTE — Progress Notes (Signed)
  Subjective:   Patient ID: Kristin Daniels    DOB: [birth year], 43 y.o. female   MRN: [id number]  Mieshia Pepitone is a 43 y.o. female with a history of h/o prolactinoma, uterine leiomyoma here for   Lump on neck - was seen 5/11 for Endo f/u with ~1cm nontender submandibular lymph node palpated on exam. No med changes at that time. - first noticed a week or two ago. Hard lump on L side of her neck - wonders if it makes her cough after yawning.  - does not hurt. - no issues eating or drinking. - no trouble breathing - when she moves it, hears it "clicking"  L eye swelling - night before last she noticed puffiness of L eye with pain, previously had burning and itching of eye, doesn't know if she scratched it. - no redness of inside of eye, purulence, discharge - "feels heavy" - no vision changes.   Review of Systems:  Per HPI.  [place], medications and smoking status reviewed.  Objective:   BP 110/68   Pulse (!) 110   LMP 08/31/2018 (Exact Date)   SpO2 97%  Vitals and nursing note reviewed.  General: overweight female, in no acute distress with non-toxic appearance HEENT: normocephalic, atraumatic, moist mucous membranes. Oropharynx clear with erythema or exudate. Sclera normal. Underside of top L lid erythematous. Very slight swelling of L top lid noted, exquisitely tender to light touch. EOMI. Neck: non-tender with ~1-39mm lymph node palpated along L side of trachea, nontender to palpation. Enlarged thyroid with nodules. CV: regular rate and rhythm without murmurs, rubs, or gallops Lungs: clear to auscultation bilaterally with normal work of breathing Skin: warm, dry, no rashes or lesions Extremities: warm and well perfused, normal tone MSK: ROM grossly intact, gait normal Neuro: Alert and oriented, speech normal  Assessment & Plan:   Stye Slight swelling with exquisite tenderness to light palpation with erythema to underside of L top lid most consistent with internal stye  without discharge, purulence. No crusting to lashes to suggest blepharitis. No vision changes, sclera normal, EOMI, no concern for intraocular pathology or orbital cellulitis. Advised warm compresses. Gave pataday drops for itching and irritation. Antibiotic treatment not indicated at this time as is likely sterile inflammation although patient remained adamant about wanting to receive antibiotic drops after discussion, referral to ophthalmology placed per patient preference.  Lymph node symptom Palpated pea-sized lymph node along L side of trachea, non-tender to palpation. No red flags on exam or through history. No current indication for treatment. Reassurance provided, will monitor.  Orders Placed This Encounter  Procedures  . Ambulatory referral to Ophthalmology    Referral Priority:   Routine    Referral Type:   Consultation    Referral Reason:   Patient Preference    Requested Specialty:   Ophthalmology    Number of Visits Requested:   1   Meds ordered this encounter  Medications  . Olopatadine HCl 0.2 % SOLN    Sig: Apply 1 drop to eye daily.    Dispense:  1 Bottle    Refill:  0    Rory Percy, DO PGY-2, [facility] 09/21/2018 1:30 PM

## 2018-09-21 NOTE — Patient Instructions (Signed)
It was great to see you!  Our plans for today:  - You have a stye. Use warm compresses for 10 minutes 3 times per day to help reduce the swelling and pain. You may get discharge from this but this is sterile fluid. You can use the eye drops daily for irritation. - If this enlarges, gets worse, or you don't have relief with warm compresses, you should see your ophthalmologist so they can drain this.  Take care and seek immediate care sooner if you develop any concerns.   Dr. [facility]

## 2018-09-24 ENCOUNTER — Other Ambulatory Visit: Payer: Self-pay

## 2018-09-25 ENCOUNTER — Ambulatory Visit: Payer: PRIVATE HEALTH INSURANCE | Admitting: Endocrinology

## 2018-10-19 ENCOUNTER — Other Ambulatory Visit: Payer: Self-pay | Admitting: Family Medicine

## 2018-10-19 DIAGNOSIS — Z1231 Encounter for screening mammogram for malignant neoplasm of breast: Secondary | ICD-10-CM

## 2018-10-21 ENCOUNTER — Other Ambulatory Visit: Payer: Self-pay

## 2018-10-21 ENCOUNTER — Ambulatory Visit
Admission: RE | Admit: 2018-10-21 | Discharge: 2018-10-21 | Disposition: A | Discharge status: Home / Self Care | Payer: PRIVATE HEALTH INSURANCE | Source: Ambulatory Visit | Attending: *Deleted | Admitting: *Deleted

## 2018-10-21 DIAGNOSIS — Z1231 Encounter for screening mammogram for malignant neoplasm of breast: Secondary | ICD-10-CM

## 2018-10-23 ENCOUNTER — Ambulatory Visit: Payer: BLUE CROSS/BLUE SHIELD

## 2018-12-06 ENCOUNTER — Encounter: Payer: Self-pay | Admitting: Family Medicine

## 2019-01-07 ENCOUNTER — Other Ambulatory Visit: Payer: Self-pay | Admitting: Family Medicine

## 2019-01-07 ENCOUNTER — Encounter: Payer: Self-pay | Admitting: Family Medicine

## 2019-02-07 ENCOUNTER — Other Ambulatory Visit: Payer: Self-pay | Admitting: Endocrinology

## 2019-02-07 DIAGNOSIS — E221 Hyperprolactinemia: Secondary | ICD-10-CM

## 2019-02-07 DIAGNOSIS — Z87898 Personal history of other specified conditions: Secondary | ICD-10-CM

## 2019-03-03 NOTE — Progress Notes (Deleted)
[place] Telemedicine Visit  Patient consented to have virtual visit. Method of visit: {TELEPHONE VS [id number]  Encounter participants: Patient: Secondary school teacher - located at *** Provider: Rory Percy - located at *** Others (if applicable): ***  Chief Complaint: ***  HPI:  HEADACHE  Headache started *** days ago Pain is *** Keeps from doing:  *** Location: *** Medications tried: *** Patient thinks cause of headache might be: ***  Head trauma: *** Sudden onset: *** Previous similar headaches: *** Taking blood thinners: *** History of cancer: ***  Symptoms Nose congestion stuffiness:  *** Nausea vomiting: *** Photophobia: *** Noise sensitivity: *** Double vision or loss of vision: *** Fever: *** Neck Stiffness: *** Trouble walking or speaking: ***  MRI 03/2016 posterior central L sided pituitary adenoma 7x4x4.37mm*** Normal prolactin 09/2018.  Son tested positive for COVID-19.  ROS: per HPI  Pertinent PMHx: ***  Exam:  Respiratory: ***  Assessment/Plan:  No problem-specific Assessment & Plan notes found for this encounter.    Time spent during visit with patient: *** minutes

## 2019-03-06 ENCOUNTER — Encounter: Payer: Self-pay | Admitting: Family Medicine

## 2019-03-08 ENCOUNTER — Encounter: Payer: Self-pay | Admitting: Family Medicine

## 2019-03-08 ENCOUNTER — Telehealth: Payer: PRIVATE HEALTH INSURANCE | Admitting: Family Medicine

## 2019-03-08 ENCOUNTER — Other Ambulatory Visit: Payer: PRIVATE HEALTH INSURANCE

## 2019-03-15 ENCOUNTER — Other Ambulatory Visit: Payer: BLUE CROSS/BLUE SHIELD

## 2019-03-19 ENCOUNTER — Ambulatory Visit: Payer: BLUE CROSS/BLUE SHIELD | Admitting: Endocrinology

## 2019-03-21 ENCOUNTER — Telehealth: Payer: PRIVATE HEALTH INSURANCE | Admitting: Physician Assistant

## 2019-03-21 DIAGNOSIS — Z20822 Contact with and (suspected) exposure to covid-19: Secondary | ICD-10-CM

## 2019-03-21 NOTE — Progress Notes (Signed)
I have spent 5 minutes in review of e-visit questionnaire, review and updating patient chart, medical decision making and response to patient.   Arnice Vanepps Cody Asuncion Tapscott, PA-C    

## 2019-03-21 NOTE — Progress Notes (Signed)
E-Visit for Corona Virus Screening   Your current symptoms could be consistent with the coronavirus.  Many health care providers can now test patients at their office but not all are.  [facility] has multiple testing sites. For information on our COVID testing locations and hours go to [email or web address]  Please quarantine yourself while awaiting your test results.  We are enrolling you in our MyChart Home Montioring for COVID19 . Daily you will receive a questionnaire within the MyChart website. Our COVID 19 response team willl be monitoriing your responses daily.    COVID-19 is a respiratory illness with symptoms that are similar to the flu. Symptoms are typically mild to moderate, but there have been cases of severe illness and death due to the virus. The following symptoms may appear 2-14 days after exposure: . Fever . Cough . Shortness of breath or difficulty breathing . Chills . Repeated shaking with chills . Muscle pain . Headache . Sore throat . New loss of taste or smell . Fatigue . Congestion or runny nose . Nausea or vomiting . Diarrhea  It is vitally important that if you feel that you have an infection such as this virus or any other virus that you stay home and away from places where you may spread it to others.  You should self-quarantine for 14 days if you have symptoms that could potentially be coronavirus or have been in close contact a with a person diagnosed with COVID-19 within the last 2 weeks. You should avoid contact with people age 65 and older.   You should wear a mask or cloth face covering over your nose and mouth if you must be around other people or animals, including pets (even at home). Try to stay at least 6 feet away from other people. This will protect the people around you.  You may also take acetaminophen (Tylenol) as needed for fever.   Reduce your risk of any infection by using the same precautions used for avoiding the  common cold or flu:  . Wash your hands often with soap and warm water for at least 20 seconds.  If soap and water are not readily available, use an alcohol-based hand sanitizer with at least 60% alcohol.  . If coughing or sneezing, cover your mouth and nose by coughing or sneezing into the elbow areas of your shirt or coat, into a tissue or into your sleeve (not your hands). . Avoid shaking hands with others and consider head nods or verbal greetings only. . Avoid touching your eyes, nose, or mouth with unwashed hands.  . Avoid close contact with people who are sick. . Avoid places or events with large numbers of people in one location, like concerts or sporting events. . Carefully consider travel plans you have or are making. . If you are planning any travel outside or inside the [place], visit the CDC's Travelers' Health webpage for the latest health notices. . If you have some symptoms but not all symptoms, continue to monitor at home and seek medical attention if your symptoms worsen. . If you are having a medical emergency, call 911.  HOME CARE . Only take medications as instructed by your medical team. . Drink plenty of fluids and get plenty of rest. . A steam or ultrasonic humidifier can help if you have congestion.   GET HELP RIGHT AWAY IF YOU HAVE EMERGENCY WARNING SIGNS** FOR COVID-19. If you or someone is showing any of these signs seek emergency medical care immediately. Call   911 or proceed to your closest emergency facility if: . You develop worsening high fever. . Trouble breathing . Bluish lips or face . Persistent pain or pressure in the chest . New confusion . Inability to wake or stay awake . You cough up blood. . Your symptoms become more severe  **This list is not all possible symptoms. Contact your medical provider for any symptoms that are sever or concerning to you.   MAKE SURE YOU   Understand these instructions.  Will watch your condition.  Will get help right  away if you are not doing well or get worse.  Your e-visit answers were reviewed by a board certified advanced clinical practitioner to complete your personal care plan.  Depending on the condition, your plan could have included both over the counter or prescription medications.  If there is a problem please reply once you have received a response from your provider.  Your safety is important to us.  If you have drug allergies check your prescription carefully.    You can use MyChart to ask questions about today's visit, request a non-urgent call back, or ask for a work or school excuse for 24 hours related to this e-Visit. If it has been greater than 24 hours you will need to follow up with your provider, or enter a new e-Visit to address those concerns. You will get an e-mail in the next two days asking about your experience.  I hope that your e-visit has been valuable and will speed your recovery. Thank you for using e-visits.    

## 2019-03-22 ENCOUNTER — Ambulatory Visit: Payer: PRIVATE HEALTH INSURANCE | Admitting: Endocrinology

## 2019-03-25 ENCOUNTER — Other Ambulatory Visit: Payer: Self-pay | Admitting: Family Medicine

## 2019-03-25 ENCOUNTER — Other Ambulatory Visit: Payer: Self-pay

## 2019-03-25 ENCOUNTER — Telehealth (INDEPENDENT_AMBULATORY_CARE_PROVIDER_SITE_OTHER): Payer: PRIVATE HEALTH INSURANCE | Admitting: Family Medicine

## 2019-03-25 ENCOUNTER — Ambulatory Visit: Payer: PRIVATE HEALTH INSURANCE | Admitting: Gastroenterology

## 2019-03-25 DIAGNOSIS — N3000 Acute cystitis without hematuria: Secondary | ICD-10-CM

## 2019-03-25 DIAGNOSIS — A048 Other specified bacterial intestinal infections: Secondary | ICD-10-CM

## 2019-03-25 DIAGNOSIS — D352 Benign neoplasm of pituitary gland: Secondary | ICD-10-CM

## 2019-03-25 DIAGNOSIS — Z87898 Personal history of other specified conditions: Secondary | ICD-10-CM

## 2019-03-25 DIAGNOSIS — R519 Headache, unspecified: Secondary | ICD-10-CM

## 2019-03-25 DIAGNOSIS — U071 COVID-19: Secondary | ICD-10-CM | POA: Diagnosis not present

## 2019-03-25 DIAGNOSIS — E221 Hyperprolactinemia: Secondary | ICD-10-CM

## 2019-03-25 MED ORDER — OMEPRAZOLE 40 MG PO CPDR: 40.0000 mg | DELAYED_RELEASE_CAPSULE | Freq: Every day | ORAL | 0 refills | Status: DC

## 2019-03-25 MED ORDER — CABERGOLINE 0.5 MG PO TABS: ORAL_TABLET | ORAL | 1 refills | Status: DC

## 2019-03-25 MED ORDER — RIBOFLAVIN-MAGNESIUM-FEVERFEW 100-90-25 MG PO TABS: 100.0000 mg | ORAL_TABLET | Freq: Every day | ORAL | 0 refills | Status: AC

## 2019-03-25 MED ORDER — CEPHALEXIN 500 MG PO CAPS: 500.0000 mg | ORAL_CAPSULE | Freq: Two times a day (BID) | ORAL | 0 refills | Status: DC

## 2019-03-25 NOTE — Progress Notes (Signed)
[place] Telemedicine Visit  Patient consented to have virtual visit. Method of visit: Telephone  Encounter participants: Patient: Kristin Daniels - located at home Provider: Kathrene Alu - located at [facility] Others (if applicable): none  Chief Complaint: COVID symptoms  HPI:  Patient thinks she came into contact with COVID October 19 at a funeral.  Her son began to have a headache and fever on the 27th.  Patient began to have a headache on that Friday and subsequently developed a dry cough on the 30th.  She then developed congestion.  Symptomatic management was not helpful.  She developed diarrhea around 11/14.  She drank Pedialyte to maintain hydration.  The health department has been tracking her and her son's symptoms and contacted her on Sunday 11/15 saying that they no longer need to trace her symptoms, but they advised that she stay quarantined for another week due to her persistent symptoms.  She is concerned about her continued headaches and diarrhea.  She takes cabergoline for a pituitary tumor and is under treatment for H. Pylori.  She sees endocrinology for her pituitary tumor and gastroenterology for treatment of her H. pylori.  She would like a neurology appointment for continued headaches and needs a refill on her Pepcid.  She is also worried that she has developed a urinary tract infection due to her frequent diarrhea.  She is having urgency and frequency.  ROS: per HPI  Pertinent PMHx: History of pituitary tumor  Exam:  Respiratory: No shortness of breath heard during conversation, no cough  Assessment/Plan:  COVID-19 Advised patient to continue quarantine until she has been without diarrhea for at least 24 hours.  She can try to get tested and about 1 week if she would like.  I will refer her to neurology per her request although I told her that we can also assess her headaches in our office, and they will hopefully subside when her Covid  infection resolves.  Patient is interested in a supplement that could help with her headaches, so riboflavin-magnesium-feverfew was prescribed.  UTI (urinary tract infection) Will treat empirically with Keflex 500 mg twice daily for 5 days.  Benign tumor of pituitary gland (HCC) Will refill cabergoline  H. pylori infection Will refill Prilosec.    Time spent during visit with patient: 20 minutes

## 2019-03-26 DIAGNOSIS — A048 Other specified bacterial intestinal infections: Secondary | ICD-10-CM | POA: Insufficient documentation

## 2019-03-26 DIAGNOSIS — U071 COVID-19: Secondary | ICD-10-CM | POA: Insufficient documentation

## 2019-03-26 DIAGNOSIS — N390 Urinary tract infection, site not specified: Secondary | ICD-10-CM | POA: Insufficient documentation

## 2019-03-26 HISTORY — DX: Other specified bacterial intestinal infections: A04.8

## 2019-03-26 NOTE — Assessment & Plan Note (Signed)
Will refill Prilosec.

## 2019-03-26 NOTE — Assessment & Plan Note (Signed)
Will refill cabergoline

## 2019-03-26 NOTE — Assessment & Plan Note (Addendum)
Advised patient to continue quarantine until she has been without diarrhea for at least 24 hours.  She can try to get tested and about 1 week if she would like.  I will refer her to neurology per her request although I told her that we can also assess her headaches in our office, and they will hopefully subside when her Covid infection resolves.  Patient is interested in a supplement that could help with her headaches, so riboflavin-magnesium-feverfew was prescribed.

## 2019-03-26 NOTE — Assessment & Plan Note (Signed)
Will treat empirically with Keflex 500 mg twice daily for 5 days.

## 2019-04-08 ENCOUNTER — Other Ambulatory Visit: Payer: Self-pay

## 2019-04-08 ENCOUNTER — Encounter (HOSPITAL_COMMUNITY): Payer: Self-pay

## 2019-04-08 ENCOUNTER — Ambulatory Visit (HOSPITAL_COMMUNITY)
Admission: EM | Admit: 2019-04-08 | Discharge: 2019-04-08 | Disposition: A | Discharge status: Home / Self Care | Payer: PRIVATE HEALTH INSURANCE | Attending: Internal Medicine | Admitting: Internal Medicine

## 2019-04-08 DIAGNOSIS — Z20828 Contact with and (suspected) exposure to other viral communicable diseases: Secondary | ICD-10-CM

## 2019-04-08 DIAGNOSIS — Z20822 Contact with and (suspected) exposure to covid-19: Secondary | ICD-10-CM

## 2019-04-08 NOTE — ED Triage Notes (Signed)
Pt. States she was exposed on 02/22/2019 & has NOT been tested at all, but her son is POSITIVE. Wants COVID testing.

## 2019-04-08 NOTE — ED Provider Notes (Signed)
[place] [facility]    CSN: [id number] Arrival date & time: 04/08/19  1357      History   Chief Complaint Chief Complaint  Patient presents with  . COVID EXPOSURE    HPI Kristin Daniels is a 43 y.o. female who recently recovered from covid 19 infection comes to urgent care requesting covid testing. No symptoms currently.   HPI  Past Medical History:  Diagnosis Date  . Acid reflux   . Colon polyp   . H. pylori infection   . History of seasonal allergies   . Hypercholesterolemia   . Migraines   . Pituitary tumor   . Thyroid nodule   . Vaginal Pap smear, abnormal     Patient Active Problem List   Diagnosis Date Noted  . COVID-19 03/26/2019  . UTI (urinary tract infection) 03/26/2019  . H. pylori infection 03/26/2019  . Stye 09/21/2018  . Lymph node symptom 09/21/2018  . 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 History of pituitary tumor 04/21/2017  . History of thyroid nodule 04/21/2017  . Enlarged thyroid gland 03/14/2017  . Fibrocystic breast changes, bilateral 03/14/2017  . Benign tumor of pituitary gland ([place]) 03/14/2017  . Uterine leiomyoma 03/14/2017    Past Surgical History:  Procedure Laterality Date  . CESAREAN SECTION      OB History    Gravida  2   Para  1   Term      Preterm  1   AB  1   Living  1     SAB  1   TAB      Ectopic      Multiple      Live Births  1            Home Medications    Prior to Admission medications   Medication Sig Start Date End Date Taking? Authorizing Provider  cabergoline (DOSTINEX) 0.5 MG tablet TAKE 1 TAB TWICE WEEK. TAKE 1/2 TABLET BY MOUTH ON SUNDAY AND 1/2 TABLET ON THURSDAY 03/25/19   Kathrene Alu, MD  cephALEXin (KEFLEX) 500 MG capsule Take 1 capsule (500 mg total) by mouth 2 (two) times daily. 03/25/19   Kathrene Alu, MD  fluticasone (FLONASE) 50 MCG/ACT nasal spray Place 2 sprays into both nostrils daily. Patient taking  differently: Place 2 sprays into both nostrils daily as needed.  08/12/18   Wilber Oliphant, MD  Olopatadine HCl 0.2 % SOLN Apply 1 drop to eye daily. 09/21/18   Rory Percy, DO  omeprazole (PRILOSEC) 40 MG capsule Take 1 capsule (40 mg total) by mouth daily. 03/25/19   Kathrene Alu, MD  Riboflavin-Magnesium-Feverfew 100-90-25 MG TABS Take 100 mg by mouth daily. 03/25/19   Kathrene Alu, MD    Family History Family History  Problem Relation Age of Onset  . Hypertension Mother   . Hypercholesterolemia Mother   . Colon polyps Maternal Grandmother     Social History Social History   Tobacco Use  . Smoking status: Never Smoker  . Smokeless tobacco: Never Used  Substance Use Topics  . Alcohol use: Yes    Frequency: Never    Comment: holidays  . Drug use: No     Allergies   Patient has no known allergies.   Review of Systems Review of Systems  Constitutional: Negative.   HENT: Negative.   Respiratory: Negative.      Physical Exam Triage Vital Signs ED Triage Vitals  Enc Vitals Group     BP 04/08/19 1457 114/86     Pulse Rate 04/08/19 1457 91     Resp 04/08/19 1457 19     Temp 04/08/19 1457 (!) 97.5 F (36.4 C)     Temp Source 04/08/19 1457 Axillary     SpO2 04/08/19 1457 100 %     Weight 04/08/19 1455 165 lb (74.8 kg)     Height --      Head Circumference --      Peak Flow --      Pain Score 04/08/19 1455 0     Pain Loc --      Pain Edu? --      Excl. in [place]? --    No data found.  Updated Vital Signs BP 114/86 (BP Location: Right Arm)   Pulse 91   Temp (!) 97.5 F (36.4 C) (Axillary)   Resp 19   Wt 74.8 kg   LMP 03/08/2019 (Exact Date)   SpO2 100%   BMI 29.70 kg/m   Visual Acuity Right Eye Distance:   Left Eye Distance:   Bilateral Distance:    Right Eye Near:   Left Eye Near:    Bilateral Near:     Physical Exam Vitals signs and nursing note reviewed.  Constitutional:      General: She is not in acute distress.    Appearance:  She is not ill-appearing or toxic-appearing.  Cardiovascular:     Rate and Rhythm: Normal rate and regular rhythm.  Neurological:     Mental Status: She is alert.      UC Treatments / Results  Labs (all labs ordered are listed, but only abnormal results are displayed) Labs Reviewed  NOVEL CORONAVIRUS, NAA (HOSP ORDER, SEND-OUT TO REF LAB; TAT 18-24 HRS)    EKG   Radiology No results found.  Procedures Procedures (including critical care time)  Medications Ordered in UC Medications - No data to display  Initial Impression / Assessment and Plan / UC Course  I have reviewed the triage vital signs and the nursing notes.  Pertinent labs & imaging results that were available during my care of the patient were reviewed by me and considered in my medical decision making (see chart for details).     1. Covid 19 testing: Covid 19 testing completed. Patient is advised to self isolate until covid test results are available. Final Clinical Impressions(s) / UC Diagnoses   Final diagnoses:  None   Discharge Instructions   None    ED Prescriptions    None     PDMP not reviewed this encounter.   Chase Picket, MD 04/09/19 [id number]

## 2019-04-09 LAB — NOVEL CORONAVIRUS, NAA (HOSP ORDER, SEND-OUT TO REF LAB; TAT 18-24 HRS): SARS-CoV-2, NAA: NOT DETECTED

## 2019-04-28 ENCOUNTER — Encounter: Payer: Self-pay | Admitting: Neurology

## 2019-04-29 ENCOUNTER — Encounter: Payer: Self-pay | Admitting: Neurology

## 2019-04-29 ENCOUNTER — Telehealth (INDEPENDENT_AMBULATORY_CARE_PROVIDER_SITE_OTHER): Payer: PRIVATE HEALTH INSURANCE | Admitting: Neurology

## 2019-04-29 ENCOUNTER — Other Ambulatory Visit: Payer: Self-pay

## 2019-04-29 VITALS — Ht 62.5 in | Wt 169.0 lb

## 2019-04-29 DIAGNOSIS — R519 Headache, unspecified: Secondary | ICD-10-CM

## 2019-04-29 DIAGNOSIS — G43009 Migraine without aura, not intractable, without status migrainosus: Secondary | ICD-10-CM

## 2019-04-29 DIAGNOSIS — D352 Benign neoplasm of pituitary gland: Secondary | ICD-10-CM

## 2019-04-29 DIAGNOSIS — G43719 Chronic migraine without aura, intractable, without status migrainosus: Secondary | ICD-10-CM

## 2019-04-29 DIAGNOSIS — D367 Benign neoplasm of other specified sites: Secondary | ICD-10-CM

## 2019-04-29 DIAGNOSIS — Z791 Long term (current) use of non-steroidal anti-inflammatories (NSAID): Secondary | ICD-10-CM

## 2019-04-29 MED ORDER — NAPROXEN SODIUM 550 MG PO TABS: 550.0000 mg | ORAL_TABLET | Freq: Two times a day (BID) | ORAL | 3 refills | Status: DC | PRN

## 2019-04-29 MED ORDER — NORTRIPTYLINE HCL 10 MG PO CAPS: 10.0000 mg | ORAL_CAPSULE | Freq: Every day | ORAL | 3 refills | Status: DC

## 2019-04-29 NOTE — Progress Notes (Signed)
Virtual Visit via Video Note The purpose of this virtual visit is to provide medical care while limiting exposure to the novel coronavirus.    Consent was obtained for video visit:  Yes.   Answered questions that patient had about telehealth interaction:  Yes.   I discussed the limitations, risks, security and privacy concerns of performing an evaluation and management service by telemedicine. I also discussed with the patient that there may be a patient responsible charge related to this service. The patient expressed understanding and agreed to proceed.  Pt location: Home Physician Location: office Name of referring provider:  [name], MD I connected with Kristin Daniels at patients initiation/request on 04/29/2019 at 11:10 AM EST by video enabled telemedicine application and verified that I am speaking with the correct person using two identifiers. Pt MRN:  [id number] Pt DOB:  [birth year] Video Participants:  [name]   History of Present Illness:  Kristin Daniels is a 43 year old female with pituitary tumor (on cabergoline) and migraines who presents for headaches.  History supplemented by referring provider note.  She has had headaches since her early 104s, at which time she was diagnosed with a pituitary adenoma.  Headaches are described as moderate to severe non-throbbing pressure involving top and front of head.  There is associated nausea, photophobia, phonophobia and scalp tenderness.  No associated vomiting, visual disturbance or numbness or weakness.  No specific triggers.  Rest helps relieve it.  They typically have occurred about 3 times a month.  However, they have been near daily since January 2020, lasting several hours up to all day.  She does not know why.  She has been treating headaches with analgesics (Advil and Tylenol) about 4 days a week.    She takes cabergoline for her pituitary adenoma and is followed by endocrinology.  Thyroid and prolactin levels  have been stable.  She had an eye exam which was unremarkable.   MRI of pituitary without and with contrast from 03/18/2016 demonstrated posterior central left-sided pituitary adenoma, likely proteinaceous, 7 mm x 4 mm x 4.5 mm extending to the insertion of the infundibulum which is slightly deviated toward the right, and mild remodeling of the sella floor.  Current NSAIDS:  Advil Current analgesics:  Tylenol Current triptans:  none Current ergotamine:  none Current anti-emetic:  none Current muscle relaxants:  none Current anti-anxiolytic:  none Current sleep aide:  none Current Antihypertensive medications:  none Current Antidepressant medications:  none Current Anticonvulsant medications:  none Current anti-CGRP:  none Current Vitamins/Herbal/Supplements:  Riboflavin-magnesium-feverfew 100-90-25mg  Current Antihistamines/Decongestants:  Flonase Other therapy:  none Hormone/birth control:  None Other medications:  cabergoline  Past NSAIDS:  Ibuprofen 800mg ; Aleve Past analgesics:  none Past abortive triptans:  none Past abortive ergotamine:  none Past muscle relaxants:  none Past anti-emetic:  none Past antihypertensive medications:  none Past antidepressant medications:  none Past anticonvulsant medications:  none Past anti-CGRP:  none Past vitamins/Herbal/Supplements:  none Past antihistamines/decongestants:  none Other past therapies:  none  Caffeine:  No coffee.   Diet:  Ginger ale.  Drinks a lot of water. Exercise:  no Depression:  no; Anxiety:  no Other pain:  no Sleep hygiene:  Inconsistent.  6-7 hours but often wakes up throughout the night. 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  She underwent reconstructive surgery in 2010.  Past Medical History: Past Medical History:  Diagnosis Date  . Acid reflux   . Colon polyp   . H. pylori infection   . History of seasonal allergies     . Hypercholesterolemia   . Migraines   . Pituitary tumor   . Thyroid nodule   . Vaginal Pap smear, abnormal     Medications: Outpatient Encounter Medications as of 04/29/2019  Medication Sig  . cabergoline (DOSTINEX) 0.5 MG tablet TAKE 1 TAB TWICE WEEK. TAKE 1/2 TABLET BY MOUTH ON SUNDAY AND 1/2 TABLET ON THURSDAY  . FIBER PO Take by mouth.  . fluticasone (FLONASE) 50 MCG/ACT nasal spray Place 2 sprays into both nostrils daily. (Patient taking differently: Place 2 sprays into both nostrils daily as needed. )  . MAGNESIUM PO Take by mouth.  . Multiple Vitamin (MULTIVITAMIN) tablet Take 1 tablet by mouth daily.  . Olopatadine HCl 0.2 % SOLN Apply 1 drop to eye daily.  . Omega-3 Fatty Acids (FISH OIL PO) Take by mouth.  Marland Kitchen omeprazole (PRILOSEC) 40 MG capsule Take 1 capsule (40 mg total) by mouth daily.  . Riboflavin-Magnesium-Feverfew 100-90-25 MG TABS Take 100 mg by mouth daily.  Marland Kitchen ZINC SULFATE-VITAMIN C MT Use as directed in the mouth or throat.  . [DISCONTINUED] cephALEXin (KEFLEX) 500 MG capsule Take 1 capsule (500 mg total) by mouth 2 (two) times daily.   No facility-administered encounter medications on file as of 04/29/2019.    Allergies: No Known Allergies  Family History: Family History  Problem Relation Age of Onset  . Hypertension Mother   . Hypercholesterolemia Mother   . Colon polyps Maternal Grandmother     Social History: Social History   Socioeconomic History  . Marital status: Single    Spouse name: Not on file  . Number of children: 1  . Years of education: Not on file  . Highest education level: Not on file  Occupational History  . Not on file  Tobacco Use  . Smoking status: Never Smoker  . Smokeless tobacco: Never Used  Substance and Sexual Activity  . Alcohol use: Yes    Comment: holidays  . Drug use: No  . Sexual activity: Yes    Birth control/protection: Condom  Other Topics Concern  . Not on file  Social History Narrative   Right handed    Lives in two story home with son.   Social Determinants of Health   Financial Resource Strain:   . Difficulty of Paying Living Expenses: Not on file  Food Insecurity:   . Worried About Charity fundraiser in the Last Year: Not on file  . Ran Out of Food in the Last Year: Not on file  Transportation Needs:   . Lack of Transportation (Medical): Not on file  . Lack of Transportation (Non-Medical): Not on file  Physical Activity:   . Days of Exercise per Week: Not on file  . Minutes of Exercise per Session: Not on file  Stress:   . Feeling of Stress : Not on file  Social Connections:   . Frequency of Communication with Friends and Family: Not on file  . Frequency of Social Gatherings with Friends and Family: Not on file  . Attends Religious Services: Not on file  . Active Member of Clubs or Organizations: Not on file  . Attends Archivist Meetings: Not on file  . Marital Status: Not on file  Intimate Partner Violence:   . Fear of Current or Ex-Partner: Not on file  . Emotionally Abused: Not on  file  . Physically Abused: Not on file  . Sexually Abused: Not on file    Observations/Objective:   Height 5' 2.5" (1.588 m), weight 169 lb (76.7 kg). No acute distress.  Alert and oriented.  Speech fluent and not dysarthric.  Language intact.  Eyes orthophoric on primary gaze.  Face symmetric.  Assessment and Plan:   1.  Migraine without aura, without status migrainosus, intractable.  Near daily headaches over the past year which have not resolved.  Therefore, I would like to order MRI to rule out secondary intracranial etiology. 2.  Pituitary adenoma.  Likely not the cause of increased headache frequency as vision and labs have been stable.  However, I would like to evaluate the pituitary in addition to the brain on MRI.  1. MRI of brain and pituitary with and without contrast.  2. For preventative management, start nortriptyline 10mg  at bedtime.  We can increase dose to 25mg   at bedtime in 4 weeks if needed. 3.  For abortive therapy, naproxen 550mg .  She will stop Advil and Tylenol.  Would not use triptan due to vasoconstricting properties with cabergoline. 4.  Limit use of pain relievers to no more than 2 days out of week to prevent risk of rebound or medication-overuse headache. 5.  Keep headache diary 6.  Exercise, hydration, caffeine cessation, sleep hygiene, monitor for and avoid triggers 7.  Consider:  magnesium citrate 400mg  daily, riboflavin 400mg  daily, and coenzyme Q10 100mg  three times daily 8. Follow up 4 months.   Follow Up Instructions:    -I discussed the assessment and treatment plan with the patient. The patient was provided an opportunity to ask questions and all were answered. The patient agreed with the plan and demonstrated an understanding of the instructions.   The patient was advised to call back or seek an in-person evaluation if the symptoms worsen or if the condition fails to improve as anticipated.   Dudley Major, DO

## 2019-05-11 ENCOUNTER — Ambulatory Visit (INDEPENDENT_AMBULATORY_CARE_PROVIDER_SITE_OTHER): Payer: PRIVATE HEALTH INSURANCE | Admitting: Gastroenterology

## 2019-05-11 ENCOUNTER — Encounter: Payer: Self-pay | Admitting: Gastroenterology

## 2019-05-11 DIAGNOSIS — R110 Nausea: Secondary | ICD-10-CM

## 2019-05-11 DIAGNOSIS — Z1159 Encounter for screening for other viral diseases: Secondary | ICD-10-CM

## 2019-05-11 DIAGNOSIS — Z86010 Personal history of colonic polyps: Secondary | ICD-10-CM | POA: Diagnosis not present

## 2019-05-11 DIAGNOSIS — K5904 Chronic idiopathic constipation: Secondary | ICD-10-CM

## 2019-05-11 DIAGNOSIS — K5900 Constipation, unspecified: Secondary | ICD-10-CM | POA: Diagnosis not present

## 2019-05-11 DIAGNOSIS — R140 Abdominal distension (gaseous): Secondary | ICD-10-CM

## 2019-05-11 MED ORDER — LINACLOTIDE 145 MCG PO CAPS: 145.0000 ug | ORAL_CAPSULE | Freq: Every day | ORAL | 11 refills | Status: DC

## 2019-05-11 MED ORDER — SUPREP BOWEL PREP KIT 17.5-3.13-1.6 GM/177ML PO SOLN: 1.0000 | ORAL | 0 refills | Status: DC

## 2019-05-11 NOTE — Progress Notes (Signed)
    History of Present Illness: This is a 44 year old female complaining of constipation, bloating and occasional nausea.  She states that even with taking MiraLAX regularly she only has a bowel movement approximately once per week and she does not feel like she completely empties.  She relates that she had colonoscopies performed twice previously. The first colonoscopy showed polyps and the second colonoscopy did not.  We have not received records from her first colonoscopy.  She felt a 5-year interval was recommended following her second colonoscopy due to her prior history of colon polyps.  Intermittent mild reflux symptoms and she takes omeprazole as needed.  Denies weight loss, abdominal pain, diarrhea, change in stool caliber, melena, hematochezia, vomiting, dysphagia, reflux symptoms, chest pain.   Current Medications, Allergies, Past Medical History, Past Surgical History, Family History and Social History were reviewed in [employer] record.   Physical Exam: Not performed - telemedicine    Assessment and Recommendations:  1.  Constipation, abdominal bloating, nausea.  Begin Linzess 145 mcg daily.  Discontinue MiraLAX.  Call after 1 week if symptoms not adequately controlled on Linzess.   2.  Personal history of colon polyps, type unknown.  Thus far we have been unable to obtain prior records.  Most recent colonoscopy in February 2015 with a 5-year interval recommended.  Schedule colonoscopy. The risks (including bleeding, perforation, infection, missed lesions, medication reactions and possible hospitalization or surgery if complications occur), benefits, and alternatives to colonoscopy with possible biopsy and possible polypectomy were discussed with the patient and they consent to proceed.    This service was provided via telehealth, audio and video.  Audio was intermittent after several minutes of audio and video working well so the visit was completed with a  phone call. The patient was located in her car with her husband.  The provider was located in office, alone.  The patient consented to this telehealth visit and is aware of possible charges for this visit.  Office CMA participated in this service.  Time spent on call: 10 minutes

## 2019-05-11 NOTE — Patient Instructions (Signed)
If you are age 44 or older, your body mass index should be between 23-30. Your Body mass index is 30.18 kg/m. If this is out of the aforementioned range listed, please consider follow up with your Primary Care Provider.  If you are age 43 or younger, your body mass index should be between 19-25. Your Body mass index is 30.18 kg/m. If this is out of the aformentioned range listed, please consider follow up with your Primary Care Provider.   You have been scheduled for a colonoscopy. Please follow written instructions given to you at your visit today.  Please pick up your prep supplies at the pharmacy within the next 1-3 days. If you use inhalers (even only as needed), please bring them with you on the day of your procedure. Your physician has requested that you go to www.startemmi.com and enter the access code given to you at your visit today. This web site gives a general overview about your procedure. However, you should still follow specific instructions given to you by our office regarding your preparation for the procedure.  We have sent the following medications to your pharmacy for you to pick up at your convenience:  START: Linzess 145 mcg one tablet daily. STOP: Miralax

## 2019-05-12 ENCOUNTER — Telehealth: Payer: Self-pay

## 2019-05-12 MED ORDER — TRULANCE 3 MG PO TABS: 1.0000 | ORAL_TABLET | Freq: Every day | ORAL | 11 refills | Status: DC

## 2019-05-12 NOTE — Telephone Encounter (Signed)
Left a message for patient to return my call. 

## 2019-05-12 NOTE — Telephone Encounter (Signed)
Received fax that Linzess is not on patient's formulary and patients preferred alternatives are Trulance and Amitiza. Please advise Dr. Fuller Plan if I can switch patient to formulary alternative.

## 2019-05-12 NOTE — Telephone Encounter (Signed)
Change to Trulance 3 mg po qd, #30, 11 refills

## 2019-05-12 NOTE — Telephone Encounter (Signed)
Prescription for Trulance has been sent to the pharmacy. Patient notified and verbalized understanding.

## 2019-05-18 ENCOUNTER — Telehealth: Payer: Self-pay

## 2019-05-18 NOTE — Telephone Encounter (Signed)
Received fax approval for Linzess 145 mcg daily from [facility]. PA is vaild through 05/13/20.

## 2019-05-20 NOTE — Telephone Encounter (Signed)
Also received fax from [facility] that Trulance claim will not be processed because the insurance requires the use of other medications before approval. The requested drug is a step 2 medication for step therapy.

## 2019-05-25 ENCOUNTER — Ambulatory Visit (INDEPENDENT_AMBULATORY_CARE_PROVIDER_SITE_OTHER): Payer: PRIVATE HEALTH INSURANCE

## 2019-05-25 ENCOUNTER — Other Ambulatory Visit: Payer: Self-pay | Admitting: Gastroenterology

## 2019-05-25 DIAGNOSIS — Z1159 Encounter for screening for other viral diseases: Secondary | ICD-10-CM

## 2019-05-25 LAB — SARS CORONAVIRUS 2 (TAT 6-24 HRS): SARS Coronavirus 2: NEGATIVE

## 2019-05-27 ENCOUNTER — Other Ambulatory Visit: Payer: Self-pay

## 2019-05-27 ENCOUNTER — Ambulatory Visit (AMBULATORY_SURGERY_CENTER): Payer: PRIVATE HEALTH INSURANCE | Admitting: Gastroenterology

## 2019-05-27 ENCOUNTER — Encounter: Payer: Self-pay | Admitting: Gastroenterology

## 2019-05-27 VITALS — BP 99/49 | HR 60 | Temp 99.3°F | Resp 16 | Ht 62.0 in | Wt 165.0 lb

## 2019-05-27 DIAGNOSIS — Z1211 Encounter for screening for malignant neoplasm of colon: Secondary | ICD-10-CM | POA: Diagnosis not present

## 2019-05-27 DIAGNOSIS — Z86010 Personal history of colonic polyps: Secondary | ICD-10-CM | POA: Diagnosis not present

## 2019-05-27 DIAGNOSIS — K5909 Other constipation: Secondary | ICD-10-CM

## 2019-05-27 DIAGNOSIS — K5900 Constipation, unspecified: Secondary | ICD-10-CM

## 2019-05-27 MED ORDER — SODIUM CHLORIDE 0.9 % IV SOLN: 500.0000 mL | Freq: Once | INTRAVENOUS | Status: DC

## 2019-05-27 MED ORDER — LINACLOTIDE 145 MCG PO CAPS: 145.0000 ug | ORAL_CAPSULE | Freq: Every day | ORAL | Status: DC

## 2019-05-27 NOTE — Progress Notes (Signed)
Pt's states no medical or surgical changes since previsit or office visit.  JB - temp DT - vitals 

## 2019-05-27 NOTE — Op Note (Signed)
[facility] Patient Name: Kristin Daniels Procedure Date: 05/27/2019 11:11 AM MRN: [id number] Endoscopist: Ladene Artist , MD Age: 44 Referring MD:  Date of Birth: 1975/07/24 Gender: Female Account #: [id number] Procedure:                Colonoscopy Indications:              High risk colon cancer surveillance: Personal                            history of colonic polyps, type unknown. Chronic                            constipation. Medicines:                Monitored Anesthesia Care Procedure:                Pre-Anesthesia Assessment:                           - Prior to the procedure, a History and Physical                            was performed, and patient medications and                            allergies were reviewed. The patient's tolerance of                            previous anesthesia was also reviewed. The risks                            and benefits of the procedure and the sedation                            options and risks were discussed with the patient.                            All questions were answered, and informed consent                            was obtained. Prior Anticoagulants: The patient has                            taken no previous anticoagulant or antiplatelet                            agents. ASA Grade Assessment: II - A patient with                            mild systemic disease. After reviewing the risks                            and benefits, the patient was deemed in  satisfactory condition to undergo the procedure.                           After obtaining informed consent, the colonoscope                            was passed under direct vision. Throughout the                            procedure, the patient's blood pressure, pulse, and                            oxygen saturations were monitored continuously. The                            Colonoscope was introduced through the anus and                             advanced to the the cecum, identified by                            appendiceal orifice and ileocecal valve. The                            ileocecal valve, appendiceal orifice, and rectum                            were photographed. The quality of the bowel                            preparation was excellent. The colonoscopy was                            performed without difficulty. The patient tolerated                            the procedure well. Scope In: 11:17:59 AM Scope Out: 11:30:54 AM Scope Withdrawal Time: 0 hours 9 minutes 45 seconds  Total Procedure Duration: 0 hours 12 minutes 55 seconds  Findings:                 The perianal and digital rectal examinations were                            normal.                           The entire examined colon appeared normal on direct                            and retroflexion views. Complications:            No immediate complications. Estimated blood loss:                            None. Estimated Blood Loss:  Estimated blood loss: none. Impression:               - The entire examined colon is normal on direct and                            retroflexion views.                           - No specimens collected. Recommendation:           - Repeat colonoscopy in 10 years for screening                            purposes.                           - Patient has a contact number available for                            emergencies. The signs and symptoms of potential                            delayed complications were discussed with the                            patient. Return to normal activities tomorrow.                            Written discharge instructions were provided to the                            patient.                           - Resume previous diet.                           - Continue present medications including Linzess                            145 mcg daily.                            - Fecal H pylori Ag. Ladene Artist, MD 05/27/2019 11:37:47 AM This report has been signed electronically.

## 2019-05-27 NOTE — Progress Notes (Signed)
A and O x3. Report to RN. Tolerated MAC anesthesia well.

## 2019-05-27 NOTE — Patient Instructions (Signed)
YOU HAD AN ENDOSCOPIC PROCEDURE TODAY AT THE [facility]:   Refer to the procedure report that was given to you for any specific questions about what was found during the examination.  If the procedure report does not answer your questions, please call your gastroenterologist to clarify.  If you requested that your care partner not be given the details of your procedure findings, then the procedure report has been included in a sealed envelope for you to review at your convenience later.  YOU SHOULD EXPECT: Some feelings of bloating in the abdomen. Passage of more gas than usual.  Walking can help get rid of the air that was put into your GI tract during the procedure and reduce the bloating. If you had a lower endoscopy (such as a colonoscopy or flexible sigmoidoscopy) you may notice spotting of blood in your stool or on the toilet paper. If you underwent a bowel prep for your procedure, you may not have a normal bowel movement for a few days.  Please Note:  You might notice some irritation and congestion in your nose or some drainage.  This is from the oxygen used during your procedure.  There is no need for concern and it should clear up in a day or so.  SYMPTOMS TO REPORT IMMEDIATELY:   Following lower endoscopy (colonoscopy or flexible sigmoidoscopy):  Excessive amounts of blood in the stool  Significant tenderness or worsening of abdominal pains  Swelling of the abdomen that is new, acute  Fever of 100F or higher  For urgent or emergent issues, a gastroenterologist can be reached at any hour by calling [phone].   DIET:  We do recommend a small meal at first, but then you may proceed to your regular diet.  Drink plenty of fluids but you should avoid alcoholic beverages for 24 hours.  ACTIVITY:  You should plan to take it easy for the rest of today and you should NOT DRIVE or use heavy machinery until tomorrow (because of the sedation medicines used during the test).     FOLLOW UP: Our staff will call the number listed on your records 48-72 hours following your procedure to check on you and address any questions or concerns that you may have regarding the information given to you following your procedure. If we do not reach you, we will leave a message.  We will attempt to reach you two times.  During this call, we will ask if you have developed any symptoms of COVID 19. If you develop any symptoms (ie: fever, flu-like symptoms, shortness of breath, cough etc.) before then, please call [phone].  If you test positive for Covid 19 in the 2 weeks post procedure, please call and report this information to us.    If any biopsies were taken you will be contacted by phone or by letter within the next 1-3 weeks.  Please call us at [phone] if you have not heard about the biopsies in 3 weeks.    SIGNATURES/CONFIDENTIALITY: You and/or your care partner have signed paperwork which will be entered into your electronic medical record.  These signatures attest to the fact that that the information above on your After Visit Summary has been reviewed and is understood.  Full responsibility of the confidentiality of this discharge information lies with you and/or your care-partner. 

## 2019-05-31 ENCOUNTER — Ambulatory Visit
Admission: RE | Admit: 2019-05-31 | Discharge: 2019-05-31 | Disposition: A | Discharge status: Home / Self Care | Payer: PRIVATE HEALTH INSURANCE | Source: Ambulatory Visit | Attending: Neurology | Admitting: Neurology

## 2019-05-31 ENCOUNTER — Telehealth: Payer: Self-pay

## 2019-05-31 DIAGNOSIS — D352 Benign neoplasm of pituitary gland: Secondary | ICD-10-CM

## 2019-05-31 DIAGNOSIS — G43719 Chronic migraine without aura, intractable, without status migrainosus: Secondary | ICD-10-CM

## 2019-05-31 DIAGNOSIS — R519 Headache, unspecified: Secondary | ICD-10-CM

## 2019-05-31 MED ORDER — GADOBENATE DIMEGLUMINE 529 MG/ML IV SOLN
10.0000 mL | Freq: Once | INTRAVENOUS | Status: AC | PRN
  Administered 2019-05-31: 10 mL via INTRAVENOUS

## 2019-05-31 NOTE — Telephone Encounter (Signed)
  Follow up Call-  Call back number 05/27/2019  Post procedure Call Back phone  # [phone]  Permission to leave phone message Yes  Some recent data might be hidden     Patient questions:  Do you have a fever, pain , or abdominal swelling? No. Pain Score  0 *  Have you tolerated food without any problems? Yes.    Have you been able to return to your normal activities? Yes.    Do you have any questions about your discharge instructions: Diet   No. Medications  No. Follow up visit  No.  Do you have questions or concerns about your Care? No.  Actions: * If pain score is 4 or above: No action needed, pain <4.  1. Have you developed a fever since your procedure? no  2.   Have you had an respiratory symptoms (SOB or cough) since your procedure? no  3.   Have you tested positive for COVID 19 since your procedure no  4.   Have you had any family members/close contacts diagnosed with the COVID 19 since your procedure?  no   If yes to any of these questions please route to Joylene John, RN and Alphonsa Gin, Therapist, sports.

## 2019-06-01 ENCOUNTER — Telehealth: Payer: Self-pay

## 2019-06-01 ENCOUNTER — Other Ambulatory Visit: Payer: PRIVATE HEALTH INSURANCE

## 2019-06-01 DIAGNOSIS — R140 Abdominal distension (gaseous): Secondary | ICD-10-CM

## 2019-06-01 NOTE — Telephone Encounter (Signed)
-----   Message from Ladene Artist, MD sent at 05/27/2019 11:34 AM EST ----- Please order stool H pylori Ag for this patient and contact her to make sure she knows Linzess 145 mcg daily is approved

## 2019-06-01 NOTE — Telephone Encounter (Signed)
Informed patient to come to our lab in the basement for H. Pylori stool antigen. Also informed patient to stop taking omeprazole 2 weeks prior to collecting stool sample. Patient verbalized understanding. Also told patient Linzess was approved with her insurance company.

## 2019-06-02 ENCOUNTER — Other Ambulatory Visit: Payer: PRIVATE HEALTH INSURANCE

## 2019-06-02 DIAGNOSIS — R140 Abdominal distension (gaseous): Secondary | ICD-10-CM

## 2019-06-03 ENCOUNTER — Telehealth: Payer: Self-pay

## 2019-06-03 ENCOUNTER — Telehealth: Payer: Self-pay | Admitting: Neurology

## 2019-06-03 LAB — HELICOBACTER PYLORI  SPECIAL ANTIGEN
MICRO NUMBER:: 10086876
SPECIMEN QUALITY: ADEQUATE

## 2019-06-03 NOTE — Telephone Encounter (Signed)
Incoming call from patient regarding concern for covid infection.  Patient states on 06/02/19 she developed sore throat and has had headaches.  Feels a little achey.  No fever, cough or shortness of breath.  Patient states 'it could just be a cold' and was wondering if she should seek covid testing and if she needed an order.  Patient informed she does not need an order for testing if symptomatic and was provided with multiple area locations for walk-in testing.    Patient states she 'seldom goes anywhere' and recently had a procedure with our facility.  She wondered what she should do. Informed that our facility follows all medically recommended protocols to protect our patients and our staff but certainly understand her concerns.    Patient should contact her PCP if not improving or if symptoms worsening.  Go for covid testing if she is concerned and would like to be tested.  Patient had no further questions or concerns.   Nothing further needed at this time.

## 2019-06-03 NOTE — Telephone Encounter (Signed)
Attempted to contact patient re: recent concern for covid infection.  Received a message from Barb Merino that patient had called stating she was having covid like symptoms since 06/02/19 and felt she may have gotten covid on the day of her colonoscopy.  Unable to reach.  Phone range 12 times without anyone picking up or without going to answering machine.   Plan to advise patient if she feels she has symptoms which concern her, to contact her PCP or seek covid testing at a local testing site.  Endoscopy takes all precautions, as recommended by the CDC, to protect our patients and our staff from spreading any diseases.  Will attempt to contact the patient again if she does not call back.

## 2019-06-03 NOTE — Telephone Encounter (Signed)
Left message to call office back

## 2019-06-03 NOTE — Telephone Encounter (Signed)
She would like to know if her "surgical area" from 2010 look to provider, and if she could have have her records sent to another doctor. Advised she needs to sign a record release. 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 She was insistent to speak with the provider and I informed her he is seeing patients and can not call her at this time that all her questions can be sent to him through the assistants. She said he needs to call her to discuss how she now "has dots but did not in 2017" and how "she is not having bad headaches and they could be from the new dots" Again reiterared to patient the copied message below. Informed her she can see about a follow up visit if she has a lot of questions so that they can discuss the findings in more depth if she needs and she stated she does not need a appointment just to speak to the provider. I again told her he is with patients and I will send all her questions to the provider. Please advise    Re-read this to patient: That is nothing to worry about.  Brains may have a couple of tiny spots or two and is not necessarily clinically significant.  As I said, there is nothing concerning on MRI that would be causing headache.

## 2019-06-03 NOTE — Telephone Encounter (Signed)
Patient called and asked could we call her regarding her MRI results? She said that she had some questions and wanted to discuss it. Please Call. Thank you

## 2019-06-03 NOTE — Telephone Encounter (Signed)
No.  Those dots are age-related changes.  If there was something concerning that was causing her headaches, I would tell her that.  If she has further questions, she may make an appointment and I would be happy to see her.

## 2019-06-04 NOTE — Telephone Encounter (Signed)
Pt is aware no other questions or concerns at this time

## 2019-08-23 ENCOUNTER — Telehealth (INDEPENDENT_AMBULATORY_CARE_PROVIDER_SITE_OTHER): Payer: PRIVATE HEALTH INSURANCE | Admitting: Family Medicine

## 2019-08-23 ENCOUNTER — Other Ambulatory Visit: Payer: Self-pay

## 2019-08-23 DIAGNOSIS — T50Z95A Adverse effect of other vaccines and biological substances, initial encounter: Secondary | ICD-10-CM | POA: Diagnosis not present

## 2019-08-23 DIAGNOSIS — R519 Headache, unspecified: Secondary | ICD-10-CM

## 2019-08-23 NOTE — Progress Notes (Signed)
[place] Telemedicine Visit  Patient consented to have virtual visit and was identified by name and date of birth. Method of visit: Video  Encounter participants: Patient: Kristin Daniels - located at home Provider: Cleophas Dunker - located at [facility] Others (if applicable): none  Chief Complaint: headache after J&J vaccination  HPI:  Kristin Daniels is a 44 y.o. 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  She felt weird right afterwards and 3 hrs later, she felt like she had a sore throat, body aches, chills, headache, subjective fever.  Continued until the following day.  4/12, all symptoms had resolved other than headache, sore throat, and right arm heaviness.  She has a pituitary tumor and frequent headaches, but this was different.  It was shooting across her head.  She was concerned because then on 4/13 she heard the vaccination was pulled.  She took tylenol and benadyrl on 4/10, then took advil and alka-seltzer cold medicine on 4/11.  Took two baby ASA as she alsways does, but then stopped on 4/13 after reading about the CVT from J&J vaccine.  Has only been taking tylenol since then.  She does not have a headache currently, she has been getting them daily and they intensify as they come on.  Usually they don't go away once she has it.  She will get the shooting pain in different areas.  No changes in vision.  No focal weakness, no seizure-like activity, no confusion, no decreased consciousness.    ROS: per HPI  Pertinent PMHx: Pituitary tumor  Exam:  LMP 07/31/2019 (Exact Date)   Respiratory: Speaking in complete sentences, no evidence of respiratory distress over video  Assessment/Plan:  Side effects of vaccination No emergent need for evaluation at this time given patient currently is not having headache, no symptoms that would correlate with cerebral venous thrombosis as is  her main concern given recent [employer] vaccination removal from market.  Advised that patient should be seen in the clinic for formal neurologic examination to ensure she is neurologically intact.  Appointment made for 4/20 at 3:50 PM in [facility].  Patient aware of this appointment.  ED precautions discussed with patient including strokelike symptoms, decreased consciousness, seizure-like activity, changes in vision.  She voiced understanding.  It is possible that the worsening that headaches are secondary to her vaccination, and less likely secondary to vaccination causing cerebral venous thrombosis.  Advised that at this time it is okay to continue to hold aspirin, recommend using mostly Tylenol for pain, can use ibuprofen sparingly.    Time spent during visit with patient: 12 minutes

## 2019-08-23 NOTE — Assessment & Plan Note (Addendum)
No emergent need for evaluation at this time given patient currently is not having headache, no symptoms that would correlate with cerebral venous thrombosis as is her main concern given recent [employer] vaccination removal from market.  Advised that patient should be seen in the clinic for formal neurologic examination to ensure she is neurologically intact.  Appointment made for 4/20 at 3:50 PM in [facility].  Patient aware of this appointment.  ED precautions discussed with patient including strokelike symptoms, decreased consciousness, seizure-like activity, changes in vision.  She voiced understanding.  It is possible that the worsening that headaches are secondary to her vaccination, and less likely secondary to vaccination causing cerebral venous thrombosis.  Advised that at this time it is okay to continue to hold aspirin, recommend using mostly Tylenol for pain, can use ibuprofen sparingly.

## 2019-08-24 ENCOUNTER — Ambulatory Visit (INDEPENDENT_AMBULATORY_CARE_PROVIDER_SITE_OTHER): Payer: PRIVATE HEALTH INSURANCE | Admitting: Family Medicine

## 2019-08-24 ENCOUNTER — Encounter: Payer: Self-pay | Admitting: Family Medicine

## 2019-08-24 ENCOUNTER — Other Ambulatory Visit: Payer: Self-pay

## 2019-08-24 VITALS — BP 120/64 | HR 82 | Ht 62.0 in | Wt 165.0 lb

## 2019-08-24 DIAGNOSIS — H538 Other visual disturbances: Secondary | ICD-10-CM | POA: Diagnosis not present

## 2019-08-24 DIAGNOSIS — R519 Headache, unspecified: Secondary | ICD-10-CM

## 2019-08-24 DIAGNOSIS — G08 Intracranial and intraspinal phlebitis and thrombophlebitis: Secondary | ICD-10-CM | POA: Diagnosis not present

## 2019-08-24 LAB — D-DIMER, QUANTITATIVE: D-Dimer, Quant: <0.27 ug/mL-FEU (ref 0.00–0.50)

## 2019-08-24 NOTE — Patient Instructions (Addendum)
Thank you for coming to see me today. It was a pleasure! Today we talked about:   We are going to have you get imaging of your head to be sure there is not any blood clot. We will schedule your imaging and call you tomorrow. I have also gotten a lab today that will check to see if you have any blood clots present.   Please follow-up if your symptoms worsen or do not improve or sooner as needed.  If you have any questions or concerns, please do not hesitate to call the office at [phone].  Take Care,   Martinique Gustavia Carie, DO

## 2019-08-24 NOTE — Progress Notes (Signed)
   SUBJECTIVE:   CHIEF COMPLAINT / HPI:   Headache after J&J vaccine: 4/10, got [employer] vaccination.  She felt weird right afterwards and 3 hrs later, she felt like she had a sore throat, body aches, chills, and a headache. Continued until the following day. 4/12, all symptoms had resolved other than headache. She has a pituitary tumor and has frequent headaches, but states this was much worse and sharp. Described as shooting across her head. She took bayer aspirin, tylenol, advil  and benadryl which did not help. She reports a light headache currently; she has been getting them daily and they intensify as they come on. She originally did not notice change in vision, but noticed yesterday that her vision was blurry in the store while looking around. It has been blurry at a distance for some time but she is concerned it may be related now.  No focal weakness, slurring of speech, no seizure-like activity, no confusion, no decreased consciousness.    PERTINENT  PMH / PSH: h/o pituitary tumor  OBJECTIVE:  BP 120/64   Pulse 82   Ht 5\' 2"  (1.575 m)   Wt 165 lb (74.8 kg)   LMP 07/31/2019 (Exact Date)   SpO2 100%   BMI 30.18 kg/m   General: NAD, pleasant Neck: Supple, no LAD Respiratory:  normal work of breathing Neuro: CN II-XII intact, strength equal throughout and sensation grossly intact Psych: AOx3, appropriate affect  ASSESSMENT/PLAN:   Headache Patient with some vision changes that could be chronic vs blurry vision with headache from recent johnson and johnson vaccine. Neuro exam wnl. No focal findings. Patient with hx of chronic headaches but now reporting worsening of headache in the acute setting s/p J&J vaccine on 4/10.  - stat d-dimer NEGATIVE to ensure no clots, 98% sens at rule out cerebral venous sinus thrombosis - attempted to obtain MRV brain but insurance would not cover - will obtain cbc to ensure platelets wnl and patient given strict return precautions, answered  all questions and understanding voiced     Martinique Petrona Wyeth, DO PGY-3, [place]

## 2019-08-25 DIAGNOSIS — R519 Headache, unspecified: Secondary | ICD-10-CM | POA: Insufficient documentation

## 2019-08-25 NOTE — Assessment & Plan Note (Signed)
Patient with some vision changes that could be chronic vs blurry vision with headache from recent johnson and johnson vaccine. Neuro exam wnl. No focal findings. Patient with hx of chronic headaches but now reporting worsening of headache in the acute setting s/p J&J vaccine on 4/10.  - stat d-dimer NEGATIVE to ensure no clots, 98% sens at rule out cerebral venous sinus thrombosis - attempted to obtain MRV brain but insurance would not cover - will obtain cbc to ensure platelets wnl and patient given strict return precautions, answered all questions and understanding voiced

## 2019-08-27 ENCOUNTER — Telehealth: Payer: Self-pay | Admitting: Family Medicine

## 2019-08-27 NOTE — Telephone Encounter (Signed)
lvm informing patient that I have refaxed the orders and will leave a copy at the front desk if she would like to pick them up.

## 2019-08-27 NOTE — Telephone Encounter (Signed)
Patient is calling back to ask if her labs that were ordered could be resent to [facility] on [address]. She spoke with them and they said they did not receive the order.   Please call patient with any questions and to let her know when this has been done.

## 2019-08-30 ENCOUNTER — Encounter: Payer: Self-pay | Admitting: Family Medicine

## 2019-09-01 NOTE — Progress Notes (Signed)
NEUROLOGY FOLLOW UP OFFICE NOTE  Berdell Macher LP:[id number]  HISTORY OF PRESENT ILLNESS: Kristin Daniels is a 44 year old female with pituitary tumor (on cabergoline) and migraines who follows up for headaches.  UPDATE: 05/31/2019 MRI BRAIN & PITUITARY W WO: personally reviewed and demonstrated two nonspecific punctate hyperintense foci in the right cerebral white matter (unremarkable) as well as 8 mmm pituitary lesion which may be a microadenoma vs Rathke's cleft cyst.  Started on nortriptyline in December.  Headaches improved.  She would still get daily headaches but mild and responded to naproxen.  She got the [place]kin 19 vaccine on April 10 and she had some increased and more severe headaches and blurred vision.  Did not respond to naproxen.  Follow up d-dimer was normal.    Current NSAIDS:  Naproxen 550mg  Current analgesics:  none Current triptans:  none Current ergotamine:  none Current anti-emetic:  none Current muscle relaxants:  none Current anti-anxiolytic:  none Current sleep aide:  none Current Antihypertensive medications:  none Current Antidepressant medications:  nortriptyline 10mg  at bedtime Current Anticonvulsant medications:  none Current anti-CGRP:  none Current Vitamins/Herbal/Supplements:  Riboflavin-magnesium-feverfew 100-90-25mg  Current Antihistamines/Decongestants:  Flonase Other therapy:  none Hormone/birth control:  None Other medications:  cabergoline  Caffeine:  No coffee.   Diet:  Ginger ale.  Drinks a lot of water. Exercise:  no Depression:  no; Anxiety:  no Other pain:  no Sleep hygiene:  Inconsistent.  6-7 hours but often wakes up throughout the night.  HISTORY: She has had headaches since her early 46s, at which time she was diagnosed with a pituitary adenoma.  Headaches are described as moderate to severe non-throbbing pressure involving top and front of head.  There is associated nausea, photophobia, phonophobia and scalp  tenderness.  No associated vomiting, visual disturbance or numbness or weakness.  No specific triggers.  Rest helps relieve it.  They typically have occurred about 3 times a month.  However, they have been near daily since January 2020, lasting several hours up to all day.  She does not know why.  She has been treating headaches with analgesics (Advil and Tylenol) about 4 days a week.    She takes cabergoline for her pituitary adenoma and is followed by endocrinology.  Thyroid and prolactin levels have been stable.  She had an eye exam which was unremarkable.   MRI of pituitary without and with contrast from 03/18/2016 demonstrated posterior central left-sided pituitary adenoma, likely proteinaceous, 7 mm x 4 mm x 4.5 mm extending to the insertion of the infundibulum which is slightly deviated toward the right, and mild remodeling of the sella floor.   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  She underwent reconstructive surgery in 2010.  PAST MEDICAL HISTORY: Past Medical History:  Diagnosis Date  . Acid reflux   . Colon polyp   . H. pylori infection   . History of seasonal allergies   . Hypercholesterolemia   . Migraines   . Pituitary tumor   . Thyroid nodule   .  Vaginal Pap smear, abnormal     MEDICATIONS: Current Outpatient Medications on File Prior to Visit  Medication Sig Dispense Refill  . cabergoline (DOSTINEX) 0.5 MG tablet TAKE 1 TAB TWICE WEEK. TAKE 1/2  TABLET BY MOUTH ON SUNDAY AND 1/2 TABLET ON THURSDAY 14 tablet 1  . FIBER PO Take by mouth.    . fluticasone (FLONASE) 50 MCG/ACT nasal spray Place 2 sprays into both nostrils daily. (Patient taking differently: Place 2 sprays into both nostrils daily as needed. ) 16 g 3  . MAGNESIUM PO Take by mouth.    . Multiple Vitamin (MULTIVITAMIN) tablet Take 1 tablet by mouth daily.    . naproxen sodium (ANAPROX DS) 550 MG tablet Take 1 tablet (550 mg total) by mouth 2 (two) times daily as needed. 20 tablet 3  . nortriptyline (PAMELOR) 10 MG capsule Take 1 capsule (10 mg total) by mouth at bedtime. 30 capsule 3  . Olopatadine HCl 0.2 % SOLN Apply 1 drop to eye daily. 1 Bottle 0  . Omega-3 Fatty Acids (FISH OIL PO) Take by mouth.    Marland Kitchen omeprazole (PRILOSEC) 40 MG capsule Take 1 capsule (40 mg total) by mouth daily. 30 capsule 0  . Plecanatide (TRULANCE) 3 MG TABS Take 1 tablet by mouth daily. 30 tablet 11  . Riboflavin-Magnesium-Feverfew 100-90-25 MG TABS Take 100 mg by mouth daily. 30 tablet 0  . ZINC SULFATE-VITAMIN C MT Use as directed in the mouth or throat.     No current facility-administered medications on file prior to visit.    ALLERGIES: No Known Allergies  FAMILY HISTORY: Family History  Problem Relation Age of Onset  . Hypertension Mother   . Hypercholesterolemia Mother   . Colon polyps Maternal Grandmother   . Colon cancer Neg Hx   . Esophageal cancer Neg Hx   . Stomach cancer Neg Hx   . Rectal cancer Neg Hx     SOCIAL HISTORY: Social History   Socioeconomic History  . Marital status: Single    Spouse name: Not on file  . Number of children: 1  . Years of education: Not on file  . Highest education level: Not on file  Occupational History  . Not on file  Tobacco Use  . Smoking status: Never Smoker  . Smokeless tobacco: Never Used  Substance and Sexual Activity  . Alcohol use: Yes    Comment: holidays  . Drug use: No  . Sexual activity: Yes    Birth  control/protection: Condom  Other Topics Concern  . Not on file  Social History Narrative   Right handed   Lives in two story home with son.   Social Determinants of Health   Financial Resource Strain:   . Difficulty of Paying Living Expenses:   Food Insecurity:   . Worried About Charity fundraiser in the Last Year:   . Arboriculturist in the Last Year:   Transportation Needs:   . Film/video editor (Medical):   Marland Kitchen Lack of Transportation (Non-Medical):   Physical Activity:   . Days of Exercise per Week:   . Minutes of Exercise per Session:   Stress:   . Feeling of Stress :   Social Connections:   . Frequency of Communication with Friends and Family:   . Frequency of Social Gatherings with Friends and Family:   . Attends Religious Services:   . Active Member of Clubs or Organizations:   . Attends Archivist Meetings:   .  Marital Status:   Intimate Partner Violence:   . Fear of Current or Ex-Partner:   . Emotionally Abused:   Marland Kitchen Physically Abused:   . Sexually Abused:      PHYSICAL EXAM: Blood pressure 107/76, pulse 91, resp. rate 18, height 5\' 2"  (1.575 m), weight 167 lb (75.8 kg), SpO2 100 %. General: No acute distress.  Patient appears well-groomed.   Head:  Normocephalic/atraumatic Eyes:  Fundi examined but not visualized Neck: supple, no paraspinal tenderness, full range of motion Heart:  Regular rate and rhythm Lungs:  Clear to auscultation bilaterally Back: No paraspinal tenderness Neurological Exam: alert and oriented to person, place, and time. Attention span and concentration intact, recent and remote memory intact, fund of knowledge intact.  Speech fluent and not dysarthric, language intact.  CN II-XII intact. Bulk and tone normal, muscle strength 5/5 throughout.  Sensation to light touch, temperature and vibration intact.  Deep tendon reflexes 2+ throughout, toes downgoing.  Finger to nose and heel to shin testing intact.  Gait normal, Romberg  negative.  IMPRESSION: 1.  Migraine without aura, without status migrainosus, not intractable. MRI of brain negative for concerning intracranial abnormality.  Pituitary lesion stable (1 mm increase).  Improved on nortriptyline but has increased following [employer] shot.  May be adverse reaction to the vaccine.  I do not suspect venous sinus thrombosis at this time as D-dimer negative. She does endorse some blurred vision, however, so I have advised her to follow up with her eye doctor to get an eye exam.  2.  Pituitary microadenoma, overall stable.  PLAN: 1.  For preventative management, increase nortriptyline to 25mg  at bedtime.  We can increase to 50mg  at bedtime in 6 weeks if needed. 2.  For abortive therapy, naproxen 550mg  3.  Limit use of pain relievers to no more than 2 days out of week to prevent risk of rebound or medication-overuse headache. 4.  Keep headache diary 5.  Exercise, hydration, caffeine cessation, sleep hygiene, monitor for and avoid triggers 6.  Consider:  magnesium citrate 400mg  daily, riboflavin 400mg  daily, and coenzyme Q10 100mg  three times daily 7. Always keep in mind that currently taking a hormone or birth control may be a possible trigger or aggravating factor for migraine. 8. Advised to follow up with eye doctor. 9. Follow up with Dr. Dwyane Dee as directed. 10. Follow up with me in 4 months.   Metta Clines, DO  CC: Rory Percy, DO

## 2019-09-02 ENCOUNTER — Ambulatory Visit (INDEPENDENT_AMBULATORY_CARE_PROVIDER_SITE_OTHER): Payer: PRIVATE HEALTH INSURANCE | Admitting: Neurology

## 2019-09-02 ENCOUNTER — Encounter: Payer: Self-pay | Admitting: Neurology

## 2019-09-02 ENCOUNTER — Other Ambulatory Visit: Payer: Self-pay

## 2019-09-02 VITALS — BP 107/76 | HR 91 | Resp 18 | Ht 62.0 in | Wt 167.0 lb

## 2019-09-02 DIAGNOSIS — G43719 Chronic migraine without aura, intractable, without status migrainosus: Secondary | ICD-10-CM

## 2019-09-02 DIAGNOSIS — D352 Benign neoplasm of pituitary gland: Secondary | ICD-10-CM

## 2019-09-02 MED ORDER — NORTRIPTYLINE HCL 25 MG PO CAPS: 25.0000 mg | ORAL_CAPSULE | Freq: Every day | ORAL | 3 refills | Status: DC

## 2019-09-02 NOTE — Patient Instructions (Signed)
1.  Increase nortriptyline to 25mg  at bedtime.  If headaches not improved in 6 weeks, contact me 2.  Use naproxen as needed but limit to no more than 2 days out of week to prevent risk of rebound or medication-overuse headache. 3.  Follow up with your eye doctor for an eye exam 4.  Keep headache diary 5.  Follow up in 4 months.

## 2019-09-13 ENCOUNTER — Encounter: Payer: Self-pay | Admitting: Family Medicine

## 2019-10-15 ENCOUNTER — Other Ambulatory Visit: Payer: Self-pay | Admitting: Family Medicine

## 2019-10-15 DIAGNOSIS — Z1231 Encounter for screening mammogram for malignant neoplasm of breast: Secondary | ICD-10-CM

## 2019-10-18 ENCOUNTER — Ambulatory Visit: Payer: PRIVATE HEALTH INSURANCE | Admitting: Family Medicine

## 2019-10-18 ENCOUNTER — Other Ambulatory Visit: Payer: Self-pay | Admitting: *Deleted

## 2019-10-18 ENCOUNTER — Telehealth: Payer: PRIVATE HEALTH INSURANCE | Admitting: Emergency Medicine

## 2019-10-18 DIAGNOSIS — N760 Acute vaginitis: Secondary | ICD-10-CM

## 2019-10-18 DIAGNOSIS — Z87898 Personal history of other specified conditions: Secondary | ICD-10-CM

## 2019-10-18 DIAGNOSIS — E221 Hyperprolactinemia: Secondary | ICD-10-CM

## 2019-10-18 MED ORDER — FLUCONAZOLE 150 MG PO TABS: 150.0000 mg | ORAL_TABLET | Freq: Once | ORAL | 0 refills | Status: AC

## 2019-10-18 MED ORDER — METRONIDAZOLE 500 MG PO TABS: 500.0000 mg | ORAL_TABLET | Freq: Two times a day (BID) | ORAL | 0 refills | Status: DC

## 2019-10-18 NOTE — Progress Notes (Signed)
We are sorry that you are not feeling well. Here is how we plan to help! Based on what you shared with me it looks like you: May have a yeast vaginosis  Vaginosis is an inflammation of the vagina that can result in discharge, itching and pain. The cause is usually a change in the normal balance of vaginal bacteria or an infection. Vaginosis can also result from reduced estrogen levels after menopause.  The most common causes of vaginosis are:   Bacterial vaginosis which results from an overgrowth of one on several organisms that are normally present in your vagina.   Yeast infections which are caused by a naturally occurring fungus called candida.   Vaginal atrophy (atrophic vaginosis) which results from the thinning of the vagina from reduced estrogen levels after menopause.   Trichomoniasis which is caused by a parasite and is commonly transmitted by sexual intercourse.  Factors that increase your risk of developing vaginosis include: . Medications, such as antibiotics and steroids . Uncontrolled diabetes . Use of hygiene products such as bubble bath, vaginal spray or vaginal deodorant . Douching . Wearing damp or tight-fitting clothing . Using an intrauterine device (IUD) for birth control . Hormonal changes, such as those associated with pregnancy, birth control pills or menopause . Sexual activity . Having a sexually transmitted infection  Your treatment plan is A single Diflucan (fluconazole) 150mg tablet once.  I have electronically sent this prescription into the pharmacy that you have chosen.  Be sure to take all of the medication as directed. Stop taking any medication if you develop a rash, tongue swelling or shortness of breath. Mothers who are breast feeding should consider pumping and discarding their breast milk while on these antibiotics. However, there is no consensus that infant exposure at these doses would be harmful.  Remember that medication creams can weaken latex  condoms. .   HOME CARE:  Good hygiene may prevent some types of vaginosis from recurring and may relieve some symptoms:  . Avoid baths, hot tubs and whirlpool spas. Rinse soap from your outer genital area after a shower, and dry the area well to prevent irritation. Don't use scented or harsh soaps, such as those with deodorant or antibacterial action. . Avoid irritants. These include scented tampons and pads. . Wipe from front to back after using the toilet. Doing so avoids spreading fecal bacteria to your vagina.  Other things that may help prevent vaginosis include:  . Don't douche. Your vagina doesn't require cleansing other than normal bathing. Repetitive douching disrupts the normal organisms that reside in the vagina and can actually increase your risk of vaginal infection. Douching won't clear up a vaginal infection. . Use a latex condom. Both female and female latex condoms may help you avoid infections spread by sexual contact. . Wear cotton underwear. Also wear pantyhose with a cotton crotch. If you feel comfortable without it, skip wearing underwear to bed. Yeast thrives in moist environments Your symptoms should improve in the next day or two.  GET HELP RIGHT AWAY IF:  . You have pain in your lower abdomen ( pelvic area or over your ovaries) . You develop nausea or vomiting . You develop a fever . Your discharge changes or worsens . You have persistent pain with intercourse . You develop shortness of breath, a rapid pulse, or you faint.  These symptoms could be signs of problems or infections that need to be evaluated by a medical provider now.  MAKE SURE YOU      Understand these instructions.  Will watch your condition.  Will get help right away if you are not doing well or get worse.  Your e-visit answers were reviewed by a board certified advanced clinical practitioner to complete your personal care plan. Depending upon the condition, your plan could have included  both over the counter or prescription medications. Please review your pharmacy choice to make sure that you have choses a pharmacy that is open for you to pick up any needed prescription, Your safety is important to [place]. If you have drug allergies check your prescription carefully.   You can use MyChart to ask questions about today's visit, request a non-urgent call back, or ask for a work or school excuse for 24 hours related to this e-Visit. If it has been greater than 24 hours you will need to follow up with your provider, or enter a new e-Visit to address those concerns. You will get a MyChart message within the next two days asking about your experience. I hope that your e-visit has been valuable and will speed your recovery.  Greater than 5 but less than 10 minutes spent researching, coordinating, and implementing care for this patient today

## 2019-10-18 NOTE — Addendum Note (Signed)
Addended by: Margarita Mail on: 10/18/2019 02:31 PM   Modules accepted: Orders

## 2019-10-19 ENCOUNTER — Other Ambulatory Visit: Payer: Self-pay | Admitting: Family Medicine

## 2019-10-19 DIAGNOSIS — D352 Benign neoplasm of pituitary gland: Secondary | ICD-10-CM

## 2019-10-19 MED ORDER — CABERGOLINE 0.5 MG PO TABS: ORAL_TABLET | ORAL | 1 refills | Status: DC

## 2019-10-19 NOTE — Telephone Encounter (Signed)
Spoke with pt. Informed her of note below. Also stated to her to make sure she tells Dr. Dwyane Dee to check her Prolactin level, and if he does not to call the office to make the lab appt to have that checked. Salvatore Marvel, CMA

## 2019-10-19 NOTE — Telephone Encounter (Signed)
If Dr. Dwyane Dee is fine with that, I'm totally fine with that.

## 2019-10-19 NOTE — Telephone Encounter (Signed)
Spoke with pt. Informed her that Dr. Ky Barban wants her to make an appt to have her Prolactin level rechecked. Pt stated that she has an appt with Dr. Dwyane Dee on 03-17-20 and that he usually checks that as well. I told pt that if would let Dr. Ky Barban know and if she was fine with her waiting that long. If not I would call her back and make that lab appt. Pt understood. Salvatore Marvel, CMA

## 2019-10-20 ENCOUNTER — Telehealth: Payer: Self-pay | Admitting: *Deleted

## 2019-10-20 ENCOUNTER — Ambulatory Visit: Payer: PRIVATE HEALTH INSURANCE | Admitting: Family Medicine

## 2019-10-20 NOTE — Telephone Encounter (Signed)
Pt calls because she was Rx'd metronidazole for BV and feels like she is allergic to it.  She took one dose, threw it up and started to have itching in her ears and redness and burning in her hands and feet.  She denies any trouble breathing.  She did take one benadryl about one hour ago.  She will come in this afternoon for an appt.  Redflags given to go to the ED. Christen Bame, CMA

## 2019-10-20 NOTE — Telephone Encounter (Signed)
Noted. Looks like she reschduled  Carollee Leitz, MD [facility]

## 2019-10-21 ENCOUNTER — Ambulatory Visit (INDEPENDENT_AMBULATORY_CARE_PROVIDER_SITE_OTHER): Payer: PRIVATE HEALTH INSURANCE | Admitting: Family Medicine

## 2019-10-21 ENCOUNTER — Encounter: Payer: Self-pay | Admitting: Family Medicine

## 2019-10-21 ENCOUNTER — Other Ambulatory Visit: Payer: Self-pay

## 2019-10-21 VITALS — BP 96/64 | HR 100 | Ht 62.0 in | Wt 166.0 lb

## 2019-10-21 DIAGNOSIS — N898 Other specified noninflammatory disorders of vagina: Secondary | ICD-10-CM

## 2019-10-21 LAB — POCT WET PREP (WET MOUNT)
Clue Cells Wet Prep Whiff POC: NEGATIVE
Trichomonas Wet Prep HPF POC: ABSENT

## 2019-10-21 MED ORDER — FLUCONAZOLE 150 MG PO TABS: ORAL_TABLET | ORAL | 0 refills | Status: DC

## 2019-10-21 NOTE — Progress Notes (Signed)
    SUBJECTIVE:   CHIEF COMPLAINT / HPI:   VAGINAL DISCHARGE Patient recently had a virtual visit last week and was told she told them she may have had BV. She had diarrhea at that time and thought it may have caused vaginal irritation leading to BV. States she had allergic reaction to metronidazole. Palms of hands turned "beet red" so did her feet. Noted burning and itching. Noted hives on her legs. Throat felt sore and inside of ears felt itching. Took benadryl and that helped. Had to take 3 doses for it to stop. Of note patient does endorse using Hibiscus soaps inside vaginal wall. Has also been using vinegar. Patient states current discharge feels like when she has BV or yeast  Onset: x1 week    Description: white, tinge yellow, thick   Odor: none    Itching: yes   Symptoms Dysuria: no  Bleeding: no  Pelvic pain: no  Back pain: no  Fever: no  Genital sores: no  Rash: no  Dyspareunia: no  GI Sxs: yes; diarrhea prior to discharge  Prior treatment: yes, metronidazole but had allergic reaction    Red Flags: Missed period: no  Pregnancy: no  Recent antibiotics: yes  Sexual activity: yes  Possible STD exposure: no  IUD: no  Diabetes: no     PERTINENT  PMH / PSH: none  OBJECTIVE:   BP 96/64   Pulse 100   Ht 5\' 2"  (1.575 m)   Wt 166 lb (75.3 kg)   LMP 10/15/2019   SpO2 98%   BMI 30.36 kg/m   Female genitalia: normal external genitalia, vulva, vagina, cervix, uterus and adnexa. Brown discharge noted (of note patient is on last day of menses)  Chaperone present   ASSESSMENT/PLAN:   Vaginal discharge Patient with vaginal discharge but wet prep negative for BV.  I discussed this with patient.  Patient states it does feel like when she has yeast infections we will plan to treat with Diflucan.  Advised informing other physicians or for metronidazole reaction to avoid being prescribed this as she did break out in hives and her throat felt sore.  Follow-up if no  improvement.     Kristin Daniels, [place]

## 2019-10-21 NOTE — Patient Instructions (Signed)
Please follow up if no improvement in your discharge.

## 2019-10-22 DIAGNOSIS — N898 Other specified noninflammatory disorders of vagina: Secondary | ICD-10-CM | POA: Insufficient documentation

## 2019-10-22 NOTE — Assessment & Plan Note (Signed)
Patient with vaginal discharge but wet prep negative for BV.  I discussed this with patient.  Patient states it does feel like when she has yeast infections we will plan to treat with Diflucan.  Advised informing other physicians or for metronidazole reaction to avoid being prescribed this as she did break out in hives and her throat felt sore.  Follow-up if no improvement.

## 2019-11-18 ENCOUNTER — Ambulatory Visit: Payer: PRIVATE HEALTH INSURANCE | Admitting: Obstetrics and Gynecology

## 2019-11-18 LAB — TSH: TSH: 0.12 — AB (ref <=5.90)

## 2019-11-24 ENCOUNTER — Telehealth: Payer: Self-pay | Admitting: Endocrinology

## 2019-11-24 NOTE — Telephone Encounter (Signed)
Patient is asking about her lab results.  Please let her know that we can discuss during office visit, I have not seen her since 09/2018

## 2019-11-24 NOTE — Telephone Encounter (Signed)
A fax was received today with labs for this pt. Would you like to review these and move her appt up sooner, rather than seeing her in November?

## 2019-11-24 NOTE — Telephone Encounter (Signed)
Please see telephone note that was sent to you today

## 2019-11-29 NOTE — Telephone Encounter (Signed)
Called pt and left voicemail with pt requesting a call back to schedule an appt sooner than November.

## 2019-12-06 ENCOUNTER — Ambulatory Visit: Payer: PRIVATE HEALTH INSURANCE

## 2019-12-17 ENCOUNTER — Ambulatory Visit: Payer: PRIVATE HEALTH INSURANCE | Admitting: Family Medicine

## 2019-12-17 NOTE — Progress Notes (Deleted)
    SUBJECTIVE:   CHIEF COMPLAINT / HPI: Infection behind ear  ***  PERTINENT  PMH / PSH: ***  OBJECTIVE:   There were no vitals taken for this visit.  ***  ASSESSMENT/PLAN:   No problem-specific Assessment & Plan notes found for this encounter.     Patriciaann Clan, [place]

## 2019-12-23 ENCOUNTER — Ambulatory Visit (INDEPENDENT_AMBULATORY_CARE_PROVIDER_SITE_OTHER): Payer: PRIVATE HEALTH INSURANCE | Admitting: Medical

## 2019-12-23 ENCOUNTER — Other Ambulatory Visit: Payer: Self-pay

## 2019-12-23 ENCOUNTER — Other Ambulatory Visit (HOSPITAL_COMMUNITY)
Admission: RE | Admit: 2019-12-23 | Discharge: 2019-12-23 | Disposition: A | Discharge status: Home / Self Care | Payer: PRIVATE HEALTH INSURANCE | Source: Ambulatory Visit | Attending: Medical | Admitting: Medical

## 2019-12-23 ENCOUNTER — Encounter: Payer: Self-pay | Admitting: Medical

## 2019-12-23 VITALS — BP 115/78 | HR 92 | Ht 62.0 in | Wt 166.1 lb

## 2019-12-23 DIAGNOSIS — L299 Pruritus, unspecified: Secondary | ICD-10-CM | POA: Diagnosis not present

## 2019-12-23 DIAGNOSIS — Z01419 Encounter for gynecological examination (general) (routine) without abnormal findings: Secondary | ICD-10-CM | POA: Insufficient documentation

## 2019-12-23 MED ORDER — FLUCONAZOLE 150 MG PO TABS: ORAL_TABLET | ORAL | 0 refills | Status: DC

## 2019-12-23 NOTE — Progress Notes (Signed)
   History:  Ms. Kristin Daniels is a 44 y.o. [phone] who presents to clinic today for annual exam with pap smear and STD testing. The last pap smear was normal 03/14/2017. She states regular periods lasting 7 days. She states moderate cramping with her periods controlled with Advil or Tylenol. She is sexually active with one female partner and uses condoms for birth control. She has a mammogram scheduled for next week.    The following portions of the patient's history were reviewed and updated as appropriate: allergies, current medications, family history, past medical history, social history, past surgical history and problem list.  Review of Systems:  Review of Systems  Constitutional: Negative for fever.  Gastrointestinal: Negative for abdominal pain.  Genitourinary: Negative for dysuria and urgency.       Neg - vaginal bleeding, discharge      Objective:  Physical Exam BP 115/78   Pulse 92   Ht 5\' 2"  (1.575 m)   Wt 166 lb 1.6 oz (75.3 kg)   LMP 11/11/2019 (Exact Date)   BMI 30.38 kg/m  Physical Exam Vitals and nursing note reviewed. Exam conducted with a chaperone present.  Constitutional:      General: She is not in acute distress.    Appearance: She is well-developed.  HENT:     Head: Normocephalic and atraumatic.  Neck:     Thyroid: No thyromegaly.  Cardiovascular:     Rate and Rhythm: Normal rate and regular rhythm.     Heart sounds: No murmur heard.   Pulmonary:     Effort: Pulmonary effort is normal. No respiratory distress.     Breath sounds: Normal breath sounds. No wheezing.  Abdominal:     General: Abdomen is flat. Bowel sounds are normal. There is no distension.     Palpations: Abdomen is soft. There is no mass.     Tenderness: There is no abdominal tenderness. There is no guarding or rebound.  Genitourinary:    General: Normal vulva.     Vagina: Vaginal discharge (small, white) present. No bleeding.     Cervix: No cervical motion tenderness, discharge or  friability.     Uterus: Not enlarged and not tender.      Adnexa:        Right: No mass or tenderness.         Left: No mass or tenderness.    Skin:    General: Skin is warm and dry.     Findings: No erythema.  Neurological:     Mental Status: She is alert and oriented to person, place, and time.  Psychiatric:        Mood and Affect: Mood normal.        Behavior: Behavior normal.     Assessment & Plan:  1. Well woman exam with routine gynecological exam - Cytology - PAP( [facility]) - Hepatitis B surface antigen - Hepatitis C antibody - RPR - HIV Antibody (routine testing w rflx) - Mammogram already scheduled for next week  - Rx for Diflucan sent to patient's pharmacy for vagina itching   Approximately 25 minutes of total time was spent with this patient on history taking, chart review and documentation.  Luvenia Redden, PA-C 12/23/2019 11:55 AM

## 2019-12-23 NOTE — Patient Instructions (Signed)

## 2019-12-24 ENCOUNTER — Encounter: Payer: Self-pay | Admitting: Family Medicine

## 2019-12-24 ENCOUNTER — Encounter: Payer: Self-pay | Admitting: Medical

## 2019-12-24 LAB — HEPATITIS C ANTIBODY: Hep C Virus Ab: 0.1 s/co ratio (ref 0.0–0.9)

## 2019-12-24 LAB — HEPATITIS B SURFACE ANTIGEN: Hepatitis B Surface Ag: NEGATIVE

## 2019-12-24 LAB — HIV ANTIBODY (ROUTINE TESTING W REFLEX): HIV Screen 4th Generation wRfx: NONREACTIVE

## 2019-12-24 LAB — RPR: RPR Ser Ql: NONREACTIVE

## 2019-12-27 LAB — CYTOLOGY - PAP
Chlamydia: NEGATIVE
Comment: NEGATIVE
Comment: NEGATIVE
Comment: NEGATIVE
Comment: NORMAL
Diagnosis: NEGATIVE
High risk HPV: NEGATIVE
Neisseria Gonorrhea: NEGATIVE
Trichomonas: NEGATIVE

## 2019-12-29 ENCOUNTER — Ambulatory Visit
Admission: RE | Admit: 2019-12-29 | Discharge: 2019-12-29 | Disposition: A | Discharge status: Home / Self Care | Payer: PRIVATE HEALTH INSURANCE | Source: Ambulatory Visit

## 2019-12-29 ENCOUNTER — Other Ambulatory Visit: Payer: Self-pay

## 2019-12-29 DIAGNOSIS — Z1231 Encounter for screening mammogram for malignant neoplasm of breast: Secondary | ICD-10-CM

## 2020-01-07 NOTE — Progress Notes (Signed)
Virtual Visit via Video Note The purpose of this virtual visit is to provide medical care while limiting exposure to the novel coronavirus.    Consent was obtained for video visit:  Yes.   Answered questions that patient had about telehealth interaction:  Yes.   I discussed the limitations, risks, security and privacy concerns of performing an evaluation and management service by telemedicine. I also discussed with the patient that there may be a patient responsible charge related to this service. The patient expressed understanding and agreed to proceed.  Pt location: Home Physician Location: office Name of referring provider:  Rory Percy, MD I connected with Kristin Daniels at patients initiation/request on 01/11/2020 at 10:50 AM EDT by video enabled telemedicine application and verified that I am speaking with the correct person using two identifiers. Pt MRN:  [id number] Pt DOB:  [birth year] Video Participants:  Kristin Daniels;   History of Present Illness:  Kristin Daniels is a 44 year old female with pituitary tumor (on cabergoline) and migraines who follows up for migraines.  UPDATE: Increased nortriptyline 4 months ago.  Headache intensity and frequency reduced but gets the headache now in the left temporal region and without nausea and photophobia and phonophobia..   Intensity:  moderate Duration:  3 hours Frequency:  Twice a week  Current NSAIDS:ibuprofen Current analgesics:none Current triptans:none Current ergotamine:none Current anti-emetic:none Current muscle relaxants:none Current anti-anxiolytic:none Current sleep aide:none Current Antihypertensive medications:none Current Antidepressant medications:nortriptyline 25mg  at bedtime Current Anticonvulsant medications:none Current anti-CGRP:none Current Vitamins/Herbal/Supplements:Riboflavin-magnesium-feverfew 100-90-25mg  Current Antihistamines/Decongestants:Flonase Other  therapy:none Hormone/birth control:None Other medications: cabergoline  Caffeine:No coffee. Diet:Ginger ale. Drinks a lot of water. Exercise:no Depression:no; Anxiety:no Other pain:no Sleep hygiene:Inconsistent. 6-7 hours but often wakes up throughout the night.  HISTORY: She has had headaches since her early 103s, at which time she was diagnosed with a pituitary adenoma. Headaches are described as moderate to severe non-throbbing pressure involving top and front of head. There is associated nausea, photophobia, phonophobia and scalp tenderness. No associated vomiting, visual disturbance or numbness or weakness. No specific triggers. Rest helps relieve it. They typically have occurred about 3 times a month. However, they have been near daily since January 2020, lasting several hours up to all day. She does not know why. She has been treating headaches with analgesics (Advil and Tylenol) about 4 days a week.   She takes cabergoline for her pituitary adenoma and is followed by endocrinology. Thyroid and prolactin levels have been stable. She had an eye exam which was unremarkable.   03/18/2016 MRI PITUITARY W WO:  posterior central left-sided pituitary adenoma, likely proteinaceous, 7 mm x 4 mm x 4.5 mm extending to the insertion of the infundibulum which is slightly deviated toward the right, and mild remodeling of the sella floor. 05/31/2019 MRI BRAIN & PITUITARY W WO: two nonspecific punctate hyperintense foci in the right cerebral white matter (unremarkable) as well as 8 mmm pituitary lesion which may be a microadenoma vs Rathke's cleft cyst.  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 She underwent reconstructive surgery in 2010.  Past Medical History: Past Medical History:  Diagnosis Date  . Acid reflux   . Colon polyp   . H. pylori infection   .  History of seasonal allergies   . Hypercholesterolemia   . Migraines   . Pituitary tumor   . Thyroid nodule   . Vaginal Pap smear, abnormal     Medications: Outpatient Encounter Medications as of 01/11/2020  Medication Sig  . cabergoline (DOSTINEX) 0.5 MG tablet TAKE 1 TAB TWICE WEEK. TAKE 1/2 TABLET BY MOUTH ON SUNDAY AND 1/2 TABLET ON THURSDAY  . FIBER PO Take by mouth.  . fluconazole (DIFLUCAN) 150 MG tablet Take 1 tablet now and then a second tablet in 72 hrs.  . fluticasone (FLONASE) 50 MCG/ACT nasal spray Place 2 sprays into both nostrils daily.  Marland Kitchen MAGNESIUM PO Take by mouth.  . Multiple Vitamin (MULTIVITAMIN) tablet Take 1 tablet by mouth daily.  . naproxen sodium (ANAPROX DS) 550 MG tablet Take 1 tablet (550 mg total) by mouth 2 (two) times daily as needed.  . nortriptyline (PAMELOR) 25 MG capsule Take 1 capsule (25 mg total) by mouth at bedtime.  . Olopatadine HCl 0.2 % SOLN Apply 1 drop to eye daily.  . Omega-3 Fatty Acids (FISH OIL PO) Take by mouth.  Marland Kitchen omeprazole (PRILOSEC) 40 MG capsule Take 1 capsule (40 mg total) by mouth daily.  Marland Kitchen Plecanatide (TRULANCE) 3 MG TABS Take 1 tablet by mouth daily.  . Riboflavin-Magnesium-Feverfew 100-90-25 MG TABS Take 100 mg by mouth daily.  Marland Kitchen ZINC SULFATE-VITAMIN C MT Use as directed in the mouth or throat.   No facility-administered encounter medications on file as of 01/11/2020.    Allergies: Allergies  Allergen Reactions  . Metronidazole Anaphylaxis    Family History: Family History  Problem Relation Age of  Onset  . Hypertension Mother   . Hypercholesterolemia Mother   . Colon polyps Maternal Grandmother   . Colon cancer Neg Hx   . Esophageal cancer Neg Hx   . Stomach cancer Neg Hx   . Rectal cancer Neg Hx     Social History: Social History   Socioeconomic History  . Marital status: Single    Spouse name: Not on file  . Number of children: 1  . Years of education: Not on file  . Highest education level: Not on file  Occupational History  . Occupation: not working  Tobacco Use  . Smoking status: Never Smoker  . Smokeless tobacco: Never Used  Vaping Use  . Vaping Use: Never used  Substance and Sexual Activity  . Alcohol use: Yes    Comment: holidays  . Drug use: No  . Sexual activity: Yes    Birth control/protection: Condom  Other Topics Concern  . Not on file  Social History Narrative   Right handed   Lives in two story home with son.   Social Determinants of Health   Financial Resource Strain:   . Difficulty of Paying Living Expenses: Not on file  Food Insecurity:   . Worried About Charity fundraiser in the Last Year: Not on file  . Ran Out of Food in the Last Year: Not on file  Transportation Needs:   . Lack of Transportation (Medical): Not on file  . Lack of Transportation (Non-Medical): Not on file  Physical Activity:   . Days of Exercise per Week: Not on file  . Minutes of Exercise per Session: Not on file  Stress:   . Feeling of Stress : Not on file  Social Connections:   . Frequency of Communication with Friends and Family: Not on file  . Frequency of Social Gatherings with Friends and  Family: Not on file  . Attends Religious Services: Not on file  . Active Member of Clubs or Organizations: Not on file  . Attends Archivist Meetings: Not on file  . Marital Status: Not on file  Intimate Partner Violence:   . Fear of Current or Ex-Partner: Not on file  . Emotionally Abused: Not on file  . Physically Abused: Not on file  . Sexually Abused:  Not on file    Observations/Objective:   There were no vitals taken for this visit. No acute distress.  Alert and oriented.  Speech fluent and not dysarthric.  Language intact.  Eyes orthophoric on primary gaze.  Face symmetric.  Assessment and Plan:   1.  Migraine without aura, without status migrainosus, not intractable.  MRI of brain negative for concerning intracranial abnormality. 2. Pituitary microadenoma  1.  Increase nortriptyline to 50mg  at bedtime.  We can increase dose to 75mg  at bedtime in 6 to 8 weeks if needed. 2.  She will try naproxen 550mg  for rescue.  Never picked up the prescription. 3.  Keep headache diary 4.  Follow up in 5 to 6 months.  Follow Up Instructions:    -I discussed the assessment and treatment plan with the patient. The patient was provided an opportunity to ask questions and all were answered. The patient agreed with the plan and demonstrated an understanding of the instructions.   The patient was advised to call back or seek an in-person evaluation if the symptoms worsen or if the condition fails to improve as anticipated.

## 2020-01-11 ENCOUNTER — Other Ambulatory Visit: Payer: Self-pay

## 2020-01-11 ENCOUNTER — Telehealth (INDEPENDENT_AMBULATORY_CARE_PROVIDER_SITE_OTHER): Payer: PRIVATE HEALTH INSURANCE | Admitting: Neurology

## 2020-01-11 ENCOUNTER — Encounter: Payer: Self-pay | Admitting: Neurology

## 2020-01-11 DIAGNOSIS — G43009 Migraine without aura, not intractable, without status migrainosus: Secondary | ICD-10-CM | POA: Diagnosis not present

## 2020-01-11 DIAGNOSIS — D352 Benign neoplasm of pituitary gland: Secondary | ICD-10-CM | POA: Diagnosis not present

## 2020-01-11 MED ORDER — NORTRIPTYLINE HCL 50 MG PO CAPS: 50.0000 mg | ORAL_CAPSULE | Freq: Every day | ORAL | 5 refills | Status: DC

## 2020-01-11 MED ORDER — NAPROXEN SODIUM 550 MG PO TABS
550.0000 mg | ORAL_TABLET | Freq: Two times a day (BID) | ORAL | 5 refills | Status: DC | PRN
Start: 2020-01-11 — End: 2023-01-20

## 2020-01-11 NOTE — Patient Instructions (Signed)
1.  Increase nortriptyline to 50mg  at bedtime.  We can increase dose to 75mg  at bedtime in 6 to 8 weeks if needed. 2.  She will try naproxen 550mg  for rescue.  Never picked up the prescription. 3.  Keep headache diary 4.  Follow up in 5 to 6 months.

## 2020-02-11 ENCOUNTER — Other Ambulatory Visit: Payer: Self-pay | Admitting: Neurology

## 2020-02-22 ENCOUNTER — Other Ambulatory Visit: Payer: Self-pay | Admitting: Family Medicine

## 2020-02-22 DIAGNOSIS — E221 Hyperprolactinemia: Secondary | ICD-10-CM

## 2020-02-22 DIAGNOSIS — Z87898 Personal history of other specified conditions: Secondary | ICD-10-CM

## 2020-02-22 NOTE — Telephone Encounter (Signed)
Rerouting to [facility]

## 2020-03-12 ENCOUNTER — Other Ambulatory Visit: Payer: Self-pay | Admitting: Endocrinology

## 2020-03-12 DIAGNOSIS — E221 Hyperprolactinemia: Secondary | ICD-10-CM

## 2020-03-12 DIAGNOSIS — E042 Nontoxic multinodular goiter: Secondary | ICD-10-CM

## 2020-03-13 ENCOUNTER — Other Ambulatory Visit: Payer: Self-pay

## 2020-03-13 ENCOUNTER — Other Ambulatory Visit (INDEPENDENT_AMBULATORY_CARE_PROVIDER_SITE_OTHER): Payer: PRIVATE HEALTH INSURANCE

## 2020-03-13 DIAGNOSIS — E042 Nontoxic multinodular goiter: Secondary | ICD-10-CM | POA: Diagnosis not present

## 2020-03-13 DIAGNOSIS — E221 Hyperprolactinemia: Secondary | ICD-10-CM

## 2020-03-13 LAB — T3, FREE: T3, Free: 3 pg/mL (ref 2.3–4.2)

## 2020-03-13 LAB — T4, FREE: Free T4: 0.62 ng/dL (ref 0.60–1.60)

## 2020-03-13 LAB — TSH: TSH: 0.28 u[IU]/mL — ABNORMAL LOW (ref 0.35–4.50)

## 2020-03-14 LAB — PROLACTIN: Prolactin: 36.2 ng/mL — ABNORMAL HIGH (ref 4.8–23.3)

## 2020-03-17 ENCOUNTER — Encounter: Payer: Self-pay | Admitting: Endocrinology

## 2020-03-17 ENCOUNTER — Ambulatory Visit (INDEPENDENT_AMBULATORY_CARE_PROVIDER_SITE_OTHER): Payer: PRIVATE HEALTH INSURANCE | Admitting: Endocrinology

## 2020-03-17 ENCOUNTER — Other Ambulatory Visit: Payer: Self-pay

## 2020-03-17 VITALS — BP 124/82 | HR 108 | Ht 62.0 in | Wt 169.2 lb

## 2020-03-17 DIAGNOSIS — E042 Nontoxic multinodular goiter: Secondary | ICD-10-CM

## 2020-03-17 DIAGNOSIS — E041 Nontoxic single thyroid nodule: Secondary | ICD-10-CM | POA: Diagnosis not present

## 2020-03-17 DIAGNOSIS — D352 Benign neoplasm of pituitary gland: Secondary | ICD-10-CM | POA: Diagnosis not present

## 2020-03-17 NOTE — Progress Notes (Signed)
Patient ID: Kristin Daniels, female   DOB: [birth year], 44 y.o.   MRN: [id number]            Chief complaint: Endocrinology follow-up  History of Present Illness    PROBLEM 1:  Prolactinoma In 2009 she had presented to her physician with complaints of milky discharge from her breasts and headaches She had not had any late or missed menstrual cycles at that time She reportedly was diagnosed to have a prolactinoma and started on cabergoline She does not know what her baseline prolactin level was  She had been followed by an endocrinologist in [place] state with periodic lab work and MRIs and continued on cabergoline Only a few reports are available from previous records and not clear if her prolactin has been consistently controlled In 12/16 her prolactin was 45 About 6 months prior to her visit in December 2018 with her PCP she was told to start taking 1/2 tablet only once a week with her Dostinex  RECENT history: In late 2018 started having some headaches and also breast fullness and mild galactorrhea When her prolactin level was 29 she was told to increase her dosage to half tablet twice a week Subsequently symptoms improved On her last visit in 09/2018 her prolactin was back to normal  She has subsequently not been seen in follow-up, she was recommended taking cabergoline 0.25 mg twice a week She has been irregular with her medications in the last few weeks because of not getting her refills Recently has had symptoms of her breasts swelling and feeling full and tender, milky discharge on expression but no change in menstrual cycles. She has had some headaches on and off but also has history of migraines  Prolactin levels:  Lab Results  Component Value Date   PROLACTIN 36.2 (H) 03/13/2020   PROLACTIN 11.7 09/08/2018   PROLACTIN 16.1 01/08/2018   PROLACTIN 24.3 (H) 07/09/2017   MRI of pituitary gland was done by neurologist in the 05/2019 which showed an 8 mm hyperintense mass  in the posterior sella.  No comparison was made with prior MRI as below  MRI of pituitary gland as of 03/15/2016 shows a 7 mm posterior central left-sided lobulated pituitary adenoma extending to the insertion of the infundibulum which is slightly deviated towards the right  Past history:  During her pregnancy when she was not on regular medication she developed severe diabetes insipidus  which was controlled with her restarting treatment and she took initially bromocriptine and subsequently cabergoline during her pregnancy   PROBLEM 2:  Multinodular goiter.  She had this diagnosed on routine exam several years ago  No formal ultrasound reports are available when she was being followed out of state and no description of her exam is available from previous notes also Review of records indicate that she probably has had needle aspiration biopsies of the left dominant nodule 3 times, see below.  Biopsies have been benign  Recently she does think the thyroid swelling is relatively larger  No recent symptoms of local pressure sensation, difficulty swallowing or choking  Her thyroid levels have been variable with some tendency to low normal or low TSH levels more recently  Lab Results  Component Value Date   TSH 0.28 (L) 03/13/2020   TSH 0.12 (A) 11/18/2019   TSH 0.52 09/08/2018   FREET4 0.62 03/13/2020   FREET4 0.71 09/08/2018   FREET4 0.61 01/08/2018   Lab Results  Component Value Date   T3FREE 3.0 03/13/2020   T3FREE  3.6 09/08/2018     Previous studies:   Her first biopsy was done in 09/2011 which had shown indeterminate cytology and this was confirmed to be benign on Affirma testing At that time her nodule was 2.8 cm  Needle aspiration done in 01/2015 indicated she had a 3 cm nodule that previously had been benign; this showed scanty cellular specimen along with colloid and degenerated macrophages In 08/2016 the needle aspiration biopsy showed a benign follicular adenoma and  the nodule size was indicated at 3.7 cm  Lab Results  Component Value Date   TSH 0.28 (L) 03/13/2020   TSH 0.12 (A) 11/18/2019   TSH 0.52 09/08/2018   FREET4 0.62 03/13/2020   FREET4 0.71 09/08/2018   FREET4 0.61 01/08/2018     Allergies as of 03/17/2020      Reactions   Metronidazole Anaphylaxis      Medication List       Accurate as of March 17, 2020 11:04 AM. If you have any questions, ask your nurse or doctor.        cabergoline 0.5 MG tablet Commonly known as: DOSTINEX TAKE 1 TAB TWICE WEEK. TAKE 1/2 TABLET BY MOUTH ON SUNDAY AND 1/2 TABLET ON THURSDAY   FIBER PO Take by mouth.   FISH OIL PO Take by mouth.   fluconazole 150 MG tablet Commonly known as: DIFLUCAN Take 1 tablet now and then a second tablet in 72 hrs.   fluticasone 50 MCG/ACT nasal spray Commonly known as: FLONASE Place 2 sprays into both nostrils daily.   MAGNESIUM PO Take by mouth.   multivitamin tablet Take 1 tablet by mouth daily.   naproxen sodium 550 MG tablet Commonly known as: Anaprox DS Take 1 tablet (550 mg total) by mouth 2 (two) times daily as needed.   nortriptyline 50 MG capsule Commonly known as: PAMELOR TAKE 1 CAPSULE BY MOUTH AT BEDTIME.   Olopatadine HCl 0.2 % Soln Apply 1 drop to eye daily.   omeprazole 40 MG capsule Commonly known as: PRILOSEC Take 1 capsule (40 mg total) by mouth daily.   Riboflavin-Magnesium-Feverfew 100-90-25 MG Tabs Take 100 mg by mouth daily.   Trulance 3 MG Tabs Generic drug: Plecanatide Take 1 tablet by mouth daily.   ZINC SULFATE-VITAMIN C MT Use as directed in the mouth or throat.       Allergies:  Allergies  Allergen Reactions  . Metronidazole Anaphylaxis    Past Medical History:  Diagnosis Date  . Acid reflux   . Colon polyp   . H. pylori infection   . History of seasonal allergies   . Hypercholesterolemia   . Migraines   . Pituitary tumor   . Thyroid nodule   . Vaginal Pap smear, abnormal     Past  Surgical History:  Procedure Laterality Date  . CESAREAN SECTION    . OTHER SURGICAL HISTORY     hemangio removed at child and had something added to head to help indentation    Family History  Problem Relation Age of Onset  . Hypertension Mother   . Hypercholesterolemia Mother   . Colon polyps Maternal Grandmother   . Colon cancer Neg Hx   . Esophageal cancer Neg Hx   . Stomach cancer Neg Hx   . Rectal cancer Neg Hx     Social History:  reports that she has never smoked. She has never used smokeless tobacco. She reports current alcohol use. She reports that she does not use drugs.   Review  of Systems  EXAM:  BP 124/82   Pulse (!) 108   Ht 5\' 2"  (1.575 m)   Wt 169 lb 3.2 oz (76.7 kg)   SpO2 98%   BMI 30.95 kg/m   Physical Exam  Her left thyroid nodule is slightly firm, 3-3.5 cm, rounded, smooth Right lobe is not palpable  No local lymph nodes palpable No tremor   ASSESSMENT:   Prolactinoma since 2009 with history of sellar mass that was heterogenous and 7 mm in original size and presenting with headaches and galactorrhea.   Her symptoms are controlled when she is consistent with her cabergoline 0.5 mg half tablet twice a week However because of her not getting her refills on time she has had recurrence of her mastodynia but No oligomenorrhea Recent prolactin is higher than before as expected at 36, previously about 12  Although she is concerned that her pituitary mass in 1/21 is larger it is not compared directly with the previous MRI  Multinodular goiter, with a longstanding benign left-sided nodule, this was shown to be a benign follicular adenoma on the last biopsy  On exam her nodule is about the same as on her last visit She may have a mildly autonomous thyroid nodule since TSH is low normal although not as suppressed as in July   PLAN:  She will resume 0.25 mg  cabergoline twice a week regularly and new prescription will be given Discussed  that she needs to be more regular with her follow-up We will need to recheck her prolactin in 2 months We will request the radiologist to compare the ultrasound exams  Patient is requesting a repeat ultrasound of the thyroid although considering that she has a stable growth of a known follicular adenoma she likely does not need periodic ultrasounds  Will need periodic thyroid functions to make sure her thyroid does not get more autonomous   Elayne Snare 03/17/20

## 2020-04-06 ENCOUNTER — Ambulatory Visit
Admission: RE | Admit: 2020-04-06 | Discharge: 2020-04-06 | Disposition: A | Discharge status: Home / Self Care | Payer: PRIVATE HEALTH INSURANCE | Source: Ambulatory Visit | Attending: Endocrinology | Admitting: Endocrinology

## 2020-04-06 DIAGNOSIS — E041 Nontoxic single thyroid nodule: Secondary | ICD-10-CM

## 2020-04-20 ENCOUNTER — Ambulatory Visit (INDEPENDENT_AMBULATORY_CARE_PROVIDER_SITE_OTHER): Payer: PRIVATE HEALTH INSURANCE | Admitting: Plastic Surgery

## 2020-04-20 ENCOUNTER — Encounter: Payer: Self-pay | Admitting: Plastic Surgery

## 2020-04-20 ENCOUNTER — Other Ambulatory Visit: Payer: Self-pay

## 2020-04-20 VITALS — BP 117/79 | HR 80 | Temp 98.1°F | Ht 62.0 in | Wt 166.0 lb

## 2020-04-20 DIAGNOSIS — L905 Scar conditions and fibrosis of skin: Secondary | ICD-10-CM

## 2020-04-20 NOTE — Progress Notes (Signed)
Referring Provider Milus Banister C, DO [address],  [place] [address]   CC:  Chief Complaint  Patient presents with  . Advice Only      Kristin Daniels is an 44 y.o. female.  HPI: Patient presents to discuss scar revision of her forehead.  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  About 10 years ago she had a revision procedure where Integra was placed beneath the scar to try to help with the depression.  That was performed in [place].  She felt like that helped for a while but the depression and transition between the graft and the surrounding skin seems to have recurred.  She wants to see if anything else can be done.  Allergies  Allergen Reactions  . Metronidazole Anaphylaxis    Outpatient Encounter Medications as of 04/20/2020  Medication Sig  . cabergoline (DOSTINEX) 0.5 MG tablet TAKE 1 TAB TWICE WEEK. TAKE 1/2 TABLET BY MOUTH ON SUNDAY AND 1/2 TABLET ON THURSDAY  . FIBER PO Take by mouth.  . fluconazole (DIFLUCAN) 150 MG tablet Take 1 tablet now and then a second tablet in 72 hrs.  . fluticasone (FLONASE) 50 MCG/ACT nasal spray Place 2 sprays into both nostrils daily.  Marland Kitchen MAGNESIUM PO Take by mouth.  . Multiple Vitamin (MULTIVITAMIN) tablet Take 1 tablet by mouth daily.  . naproxen sodium (ANAPROX DS) 550 MG tablet Take 1 tablet (550 mg total) by mouth 2 (two) times daily as needed.  . nortriptyline (PAMELOR) 50 MG capsule TAKE 1 CAPSULE BY MOUTH AT BEDTIME.  Marland Kitchen Olopatadine HCl 0.2 % SOLN Apply 1 drop to eye daily.  . Omega-3 Fatty Acids (FISH OIL PO) Take by mouth.  Marland Kitchen omeprazole (PRILOSEC) 40 MG capsule Take 1 capsule (40 mg total) by mouth daily.  Marland Kitchen Plecanatide (TRULANCE) 3 MG TABS Take 1 tablet by mouth daily.  . Riboflavin-Magnesium-Feverfew 100-90-25 MG TABS Take 100 mg by mouth daily.  Marland Kitchen ZINC SULFATE-VITAMIN C MT Use as directed in the mouth or throat.   No  facility-administered encounter medications on file as of 04/20/2020.     Past Medical History:  Diagnosis Date  . Acid reflux   . Colon polyp   . H. pylori infection   . History of seasonal allergies   . Hypercholesterolemia   . Migraines   . Pituitary tumor   . Thyroid nodule   . Vaginal Pap smear, abnormal     Past Surgical History:  Procedure Laterality Date  . CESAREAN SECTION    . OTHER SURGICAL HISTORY     hemangio removed at child and had something added to head to help indentation    Family History  Problem Relation Age of Onset  . Hypertension Mother   . Hypercholesterolemia Mother   . Colon polyps Maternal Grandmother   . Colon cancer Neg Hx   . Esophageal cancer Neg Hx   . Stomach cancer Neg Hx   . Rectal cancer Neg Hx     Social History   Social History Narrative   Right handed   Lives in two story home with son.     Review of Systems General: Denies fevers, chills, weight loss CV: Denies chest pain, shortness of breath, palpitations  Physical Exam Vitals with BMI 04/20/2020 03/17/2020 12/23/2019  Height 5\' 2"  5\' 2"  5\' 2"   Weight 166 lbs 169 lbs 3 oz 166 lbs 2 oz  BMI 30.35 35.46 56.81  Systolic 275 170 017  Diastolic 79 82 78  Pulse 80 108 92    General:  No acute distress,  Alert and oriented, Non-Toxic, Normal speech and affect On exam above her right eyebrow there is a diamond-shaped patch which looks like a skin graft.  The color is fairly uniform today.  There is a little bit of a transition between the graft and her native skin medially that is probably the most notable contour discrepancy.  Assessment/Plan Patient presents with a chronic scar in her right forehead.  I did say overall at this point the result looks pretty good considering what is already taken place.  I discussed dermabrasion with her to try to plan the transition between the forehead and the graft and I think this would improve the contour and have a low risk profile.  I  discussed the risk of the procedure with her that include bleeding, infection, damage to surrounding structures and need for additional procedures.  She is in agreement and will plan to schedule this.  Cindra Presume 04/20/2020, 11:48 AM

## 2020-04-24 ENCOUNTER — Other Ambulatory Visit: Payer: Self-pay | Admitting: Family Medicine

## 2020-04-24 DIAGNOSIS — E221 Hyperprolactinemia: Secondary | ICD-10-CM

## 2020-04-24 DIAGNOSIS — Z87898 Personal history of other specified conditions: Secondary | ICD-10-CM

## 2020-05-24 ENCOUNTER — Ambulatory Visit (INDEPENDENT_AMBULATORY_CARE_PROVIDER_SITE_OTHER): Payer: Self-pay | Admitting: Plastic Surgery

## 2020-05-24 ENCOUNTER — Encounter: Payer: Self-pay | Admitting: Plastic Surgery

## 2020-05-24 ENCOUNTER — Other Ambulatory Visit: Payer: Self-pay

## 2020-05-24 VITALS — BP 118/77 | HR 103 | Ht 62.0 in | Wt 165.0 lb

## 2020-05-24 DIAGNOSIS — Z411 Encounter for cosmetic surgery: Secondary | ICD-10-CM

## 2020-05-24 NOTE — Progress Notes (Signed)
Patient presents for dermabrasion of the forehead.  She has some scarring from prior trauma we discussed various alternatives.  We discussed the risks and benefits specific to this procedure including bleeding, infection, damage surrounding structures and need for additional procedures.  We discussed the potential for scarring and slight changes in skin pigment.  She is fully understanding and elected to proceed.  Topical lidocaine was applied 30 minutes before the procedure.  The area was then prepped with chlorhexidine wipe.  The dermabrasion burr was then used to abrade the superficial skin just down through the epidermis.  There was subtle punctate bleeding in a few areas.  This was done on the right side of her forehead, another small scar in the central forehead, and another small scar in the left frontal area.  She tolerated this fine.  Vaseline was applied.  She was given wound care instructions and we will plan to see her again in about a month.

## 2020-05-29 ENCOUNTER — Ambulatory Visit (INDEPENDENT_AMBULATORY_CARE_PROVIDER_SITE_OTHER): Payer: PRIVATE HEALTH INSURANCE | Admitting: Dermatology

## 2020-05-29 ENCOUNTER — Other Ambulatory Visit: Payer: Self-pay

## 2020-05-29 DIAGNOSIS — L300 Nummular dermatitis: Secondary | ICD-10-CM | POA: Diagnosis not present

## 2020-05-29 MED ORDER — DESONIDE 0.05 % EX CREA: TOPICAL_CREAM | Freq: Two times a day (BID) | CUTANEOUS | 0 refills | Status: DC

## 2020-05-29 NOTE — Patient Instructions (Signed)
Neutrogena rapid relief

## 2020-05-30 ENCOUNTER — Telehealth: Payer: Self-pay

## 2020-05-30 NOTE — Telephone Encounter (Signed)
Charl Waitman [place] ([facility]) [phone] Desonide 0.05% cream     Status: Sent to Plan  Created: January 24th, 2022 [id number]  Sent: January 25th, 2022

## 2020-06-01 ENCOUNTER — Telehealth: Payer: Self-pay | Admitting: Dermatology

## 2020-06-01 DIAGNOSIS — L300 Nummular dermatitis: Secondary | ICD-10-CM

## 2020-06-01 NOTE — Telephone Encounter (Signed)
May substitute generic 2.5% Hytone cream, large tube with 1 refill.

## 2020-06-01 NOTE — Telephone Encounter (Signed)
See message.

## 2020-06-01 NOTE — Telephone Encounter (Signed)
Call from [facility] @ [facility]. [phone]. Referred to patient as [facility]. Said the desonide cream is not approved and she needs a different corticoid steroid

## 2020-06-02 MED ORDER — HYDROCORTISONE 2.5 % EX CREA: TOPICAL_CREAM | Freq: Two times a day (BID) | CUTANEOUS | 1 refills | Status: DC

## 2020-06-08 NOTE — Progress Notes (Signed)
NEUROLOGY FOLLOW UP OFFICE NOTE  Kristin Daniels [id number]   Subjective:  Kristin Daniels is a 45year old female with pituitary tumor (on cabergoline) and migraines who follows up for migraines.  UPDATE:   Intensity:  moderate Duration:  3 hours Frequency:  two to three times a week  Current NSAIDS:naproxen 550mg  Current analgesics:none Current triptans:none Current ergotamine:none Current anti-emetic:none Current muscle relaxants:none Current anti-anxiolytic:none Current sleep aide:none Current Antihypertensive medications:none Current Antidepressant medications:nortriptyline50mg  at bedtime Current Anticonvulsant medications:none Current anti-CGRP:none Current Vitamins/Herbal/Supplements:Riboflavin-magnesium-feverfew 100-90-25mg  Current Antihistamines/Decongestants:Flonase Other therapy:none Hormone/birth control:None Other medications: cabergoline  Caffeine:No coffee. Diet:Ginger ale. Drinks a lot of water. Exercise:no Depression:no; Anxiety:no Other pain:no Sleep hygiene:Inconsistent. 6-7 hours but often wakes up throughout the night.  HISTORY: She has had headaches since her early 100s, at which time she was diagnosed with a pituitary adenoma. Headaches are described as moderate to severe non-throbbing pressure involving top and front of head or left temporal region. There is associated nausea, photophobia, phonophobia and scalp tenderness. No associated vomiting, visual disturbance or numbness or weakness. No specific triggers. Rest helps relieve it. They typically have occurred about 3 times a month. However, they have been near daily since January 2020, lasting several hours up to all day. She does not know why. She has been treating headaches with analgesics (Advil and Tylenol) about 4 days a week.   She takes cabergoline for her pituitary adenoma and is followed by endocrinology. Thyroid and  prolactin levels have been stable. She had an eye exam which was unremarkable.   03/18/2016 MRI PITUITARY W WO:  posterior central left-sided pituitary adenoma, likely proteinaceous, 7 mm x 4 mm x 4.5 mm extending to the insertion of the infundibulum which is slightly deviated toward the right, and mild remodeling of the sella floor. 05/31/2019 MRI BRAIN & PITUITARY W WO: two nonspecific punctate hyperintense foci in the right cerebral white matter (unremarkable) as well as 8 mmm pituitary lesion which may be a microadenoma vs Rathke's cleft cyst.  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 She underwent reconstructive surgery in 2010.  PAST MEDICAL HISTORY: Past Medical History:  Diagnosis Date  . Acid reflux   . Colon polyp   . H. pylori infection   . History of seasonal allergies   . Hypercholesterolemia   . Migraines   . Pituitary tumor   . Thyroid nodule   . Vaginal Pap smear, abnormal     MEDICATIONS: Current Outpatient Medications on File Prior to Visit  Medication Sig Dispense Refill  . cabergoline (DOSTINEX) 0.5 MG tablet TAKE 1 TAB TWICE WEEK. TAKE 1/2 TABLET BY MOUTH ON SUNDAY AND 1/2 TABLET ON THURSDAY 14 tablet 1  . desonide (DESOWEN) 0.05 % cream Apply topically 2 (two) times daily. 30 g 0  . FIBER PO Take by mouth.    . fluconazole (DIFLUCAN) 150 MG tablet Take 1 tablet now and then a second tablet in 72 hrs. 1 tablet 0  . fluticasone (FLONASE) 50  MCG/ACT nasal spray Place 2 sprays into both nostrils daily. 16 g 3  . hydrocortisone 2.5 % cream Apply topically 2 (two) times daily. 90 g 1  . MAGNESIUM PO Take by mouth.    Marland Kitchen  Multiple Vitamin (MULTIVITAMIN) tablet Take 1 tablet by mouth daily.    . naproxen sodium (ANAPROX DS) 550 MG tablet Take 1 tablet (550 mg total) by mouth 2 (two) times daily as needed. 20 tablet 5  . nortriptyline (PAMELOR) 50 MG capsule TAKE 1 CAPSULE BY MOUTH AT BEDTIME. 90 capsule 0  . Olopatadine HCl 0.2 % SOLN Apply 1 drop to eye daily. 1 Bottle 0  . Omega-3 Fatty Acids (FISH OIL PO) Take by mouth.    Marland Kitchen omeprazole (PRILOSEC) 40 MG capsule Take 1 capsule (40 mg total) by mouth daily. 30 capsule 0  . Plecanatide (TRULANCE) 3 MG TABS Take 1 tablet by mouth daily. 30 tablet 11  . Riboflavin-Magnesium-Feverfew 100-90-25 MG TABS Take 100 mg by mouth daily. 30 tablet 0  . ZINC SULFATE-VITAMIN C MT Use as directed in the mouth or throat.     No current facility-administered medications on file prior to visit.    ALLERGIES: Allergies  Allergen Reactions  . Metronidazole Anaphylaxis    FAMILY HISTORY: Family History  Problem Relation Age of Onset  . Hypertension Mother   . Hypercholesterolemia Mother   . Colon polyps Maternal Grandmother   . Colon cancer Neg Hx   . Esophageal cancer Neg Hx   . Stomach cancer Neg Hx   . Rectal cancer Neg Hx     SOCIAL HISTORY: Social History   Socioeconomic History  . Marital status: Single    Spouse name: Not on file  . Number of children: 1  . Years of education: Not on file  . Highest education level: Not on file  Occupational History  . Occupation: not working  Tobacco Use  . Smoking status: Never Smoker  . Smokeless tobacco: Never Used  Vaping Use  . Vaping Use: Never used  Substance and Sexual Activity  . Alcohol use: Yes    Comment: holidays  . Drug use: No  . Sexual activity: Yes    Birth control/protection: Condom  Other Topics Concern  . Not on  file  Social History Narrative   Right handed   Lives in two story home with son.   Social Determinants of Health   Financial Resource Strain: Not on file  Food Insecurity: Not on file  Transportation Needs: Not on file  Physical Activity: Not on file  Stress: Not on file  Social Connections: Not on file  Intimate Partner Violence: Not on file     Objective:  Blood pressure 115/81, pulse 100, height 5\' 2"  (1.575 m), weight 170 lb 12.8 oz (77.5 kg), last menstrual period 05/17/2020, SpO2 98 %. General: No acute distress.  Patient appears well-groomed.   Head:  Normocephalic/atraumatic Eyes:  Fundi examined but not visualized Neck: supple, no paraspinal tenderness, full range of motion Heart:  Regular rate and rhythm Lungs:  Clear to auscultation bilaterally Back: No paraspinal tenderness Neurological Exam: alert and oriented to person, place, and time. Attention span and concentration intact, recent and remote memory intact, fund of knowledge intact.  Speech fluent and not dysarthric, language intact.  CN II-XII intact. Bulk and tone normal, muscle strength 5/5 throughout.  Sensation to light touch, temperature and vibration intact.  Deep tendon reflexes 2+ throughout, toes downgoing.  Finger to nose and heel to shin testing intact.  Gait normal, Romberg negative.   Assessment/Plan:   1. Migraine without aura, without status migrainosus, not intractable 2.  Pituitary microadenoma  1.  Increase nortriptyline to 75mg  at bedtime 2.  Naproxen 550mg  for  rescue.  Limit use of pain relievers to no more than 2 days out of week to prevent risk of rebound or medication-overuse headache. 3.  Keep headache diary  Metta Clines, DO  CC: Milus Banister, DO

## 2020-06-12 ENCOUNTER — Encounter: Payer: Self-pay | Admitting: Neurology

## 2020-06-12 ENCOUNTER — Ambulatory Visit (INDEPENDENT_AMBULATORY_CARE_PROVIDER_SITE_OTHER): Payer: PRIVATE HEALTH INSURANCE | Admitting: Neurology

## 2020-06-12 ENCOUNTER — Other Ambulatory Visit: Payer: Self-pay

## 2020-06-12 VITALS — BP 115/81 | HR 100 | Ht 62.0 in | Wt 170.8 lb

## 2020-06-12 DIAGNOSIS — D352 Benign neoplasm of pituitary gland: Secondary | ICD-10-CM

## 2020-06-12 DIAGNOSIS — G43009 Migraine without aura, not intractable, without status migrainosus: Secondary | ICD-10-CM | POA: Diagnosis not present

## 2020-06-12 MED ORDER — NORTRIPTYLINE HCL 75 MG PO CAPS: 75.0000 mg | ORAL_CAPSULE | Freq: Every day | ORAL | 5 refills | Status: DC

## 2020-06-12 NOTE — Patient Instructions (Signed)
1.  Increase nortriptyline to 75mg  at bedtime 2.  Naproxen 550mg  as needed for migraine but limit use of naproxen (and all pain relievers) to no more than 2 days out of week to prevent rebound headache. 3.  Keep headache diary 4.  Follow up 6 months.

## 2020-06-21 ENCOUNTER — Other Ambulatory Visit: Payer: Self-pay

## 2020-06-21 ENCOUNTER — Other Ambulatory Visit (INDEPENDENT_AMBULATORY_CARE_PROVIDER_SITE_OTHER): Payer: PRIVATE HEALTH INSURANCE

## 2020-06-21 DIAGNOSIS — D352 Benign neoplasm of pituitary gland: Secondary | ICD-10-CM | POA: Diagnosis not present

## 2020-06-22 LAB — PROLACTIN: Prolactin: 17.7 ng/mL (ref 4.8–23.3)

## 2020-06-26 ENCOUNTER — Other Ambulatory Visit: Payer: PRIVATE HEALTH INSURANCE

## 2020-06-28 ENCOUNTER — Encounter: Payer: Self-pay | Admitting: Plastic Surgery

## 2020-06-28 ENCOUNTER — Other Ambulatory Visit: Payer: Self-pay

## 2020-06-28 ENCOUNTER — Ambulatory Visit (INDEPENDENT_AMBULATORY_CARE_PROVIDER_SITE_OTHER): Payer: Self-pay | Admitting: Plastic Surgery

## 2020-06-28 VITALS — BP 115/73 | HR 99

## 2020-06-28 DIAGNOSIS — Z411 Encounter for cosmetic surgery: Secondary | ICD-10-CM

## 2020-06-28 NOTE — Progress Notes (Signed)
Patient is here about 6 weeks postop from dermabrasion to her forehead scars.  She feels like things have healed and there is a little bit of hyperpigmentation but the patient has had this before and she is confident that it will continue to fade.  She feels like we smooth things out a little bit with the scars but would like to do another treatment to a medial aspect of her grafted skin in the right forehead along with a small scar centrally and a small depressed scar that is in her lateral hairline on the left.  She was asking about filler injection to the scarred area and while I think that there may be a few small areas that could benefit I recommended using a hyaluronic acid filler in the event that she does not like it then it would eventually fade away.  We will plan to set her up for another treatment of dermabrasion and she is going to consider having a little bit of Restylane-L placed at the same time.  We discussed the risks and benefits that she is familiar with and will plan to see her at her next visit.

## 2020-06-30 ENCOUNTER — Encounter: Payer: Self-pay | Admitting: Endocrinology

## 2020-06-30 ENCOUNTER — Other Ambulatory Visit: Payer: Self-pay

## 2020-06-30 ENCOUNTER — Ambulatory Visit (INDEPENDENT_AMBULATORY_CARE_PROVIDER_SITE_OTHER): Payer: PRIVATE HEALTH INSURANCE | Admitting: Endocrinology

## 2020-06-30 VITALS — BP 108/72 | HR 99 | Temp 98.8°F | Resp 16 | Ht 62.0 in | Wt 170.0 lb

## 2020-06-30 DIAGNOSIS — E221 Hyperprolactinemia: Secondary | ICD-10-CM

## 2020-06-30 DIAGNOSIS — Z87898 Personal history of other specified conditions: Secondary | ICD-10-CM

## 2020-06-30 DIAGNOSIS — E042 Nontoxic multinodular goiter: Secondary | ICD-10-CM

## 2020-06-30 MED ORDER — CABERGOLINE 0.5 MG PO TABS: ORAL_TABLET | ORAL | 1 refills | Status: DC

## 2020-06-30 NOTE — Progress Notes (Signed)
Patient ID: Kristin Daniels, female   DOB: May 14, 1975, 45 y.o.   MRN: [id number]            Chief complaint: Endocrinology follow-up  History of Present Illness    PROBLEM 1:  Prolactinoma In 2009 she had presented to her physician with complaints of milky discharge from her breasts and headaches She had not had any late or missed menstrual cycles at that time She reportedly was diagnosed to have a prolactinoma and started on cabergoline She does not know what her baseline prolactin level was  She had been followed by an endocrinologist in [place] state with periodic lab work and MRIs and continued on cabergoline Only a few reports are available from previous records and not clear if her prolactin has been consistently controlled In 12/16 her prolactin was 45 About 6 months prior to her visit in December 2018 with her PCP she was told to start taking 1/2 tablet only once a week with her Dostinex  RECENT history: In late 2018 started having some headaches and also breast fullness and mild galactorrhea When her prolactin level was 29 she was told to increase her dosage to half tablet twice a week Subsequently symptoms improved  She was seen in follow-up in 11/21 after a gap of 1-1/2 years She had been irregular with her medications prior to this visit and started having symptoms of her breasts swelling, feeling full and tender.  Also having milky discharge on expression but no change in menstrual cycles. She has had some headaches on and off but also has history of migraines  Although she has been on half tablet cabergoline twice a week regularly since 11/21 she does not think her symptoms are improving and still has breast tenderness and galactorrhea  Prolactin levels:  Lab Results  Component Value Date   PROLACTIN 17.7 06/21/2020   PROLACTIN 36.2 (H) 03/13/2020   PROLACTIN 11.7 09/08/2018   PROLACTIN 16.1 01/08/2018   MRI of pituitary gland was done by neurologist in the  05/2019 which showed an 8 mm hyperintense mass in the posterior sella.  No comparison was made with prior MRI as below  MRI of pituitary gland as of 03/15/2016 shows a 7 mm posterior central left-sided lobulated pituitary adenoma extending to the insertion of the infundibulum which is slightly deviated towards the right  Past history:  During her pregnancy when she was not on regular medication she developed severe diabetes insipidus  which was controlled with her restarting treatment and she took initially bromocriptine and subsequently cabergoline during her pregnancy   PROBLEM 2:  Multinodular goiter.  She had this diagnosed on routine exam several years ago  No prior ultrasound reports are available when [place] was [place] out of state and no description of her exam is available from previous notes.  However baseline size of the nodule on the left was 2.8 cm in 2013 Records have shown that she had needle aspiration biopsies of the left dominant nodule 3 times, see below.  Biopsies have been benign consistently including Afirma testing  She is again concerned that her nodule may be relatively larger and wants a treatment to shrink this Thyroid nodule measured 4.4 cm in 04/2020  No symptoms of local pressure sensation, difficulty swallowing or choking, occasionally get a little cough sensation in certain positions of the neck She does not complain of palpitations or weight loss In 11/21 her nodule was about 3-3.5 cm on exam  Her thyroid levels have been variable  with some tendency to low normal or low TSH levels without increase in free T4 or T3 levels  Lab Results  Component Value Date   TSH 0.28 (L) 03/13/2020   TSH 0.12 (A) 11/18/2019   TSH 0.52 09/08/2018   FREET4 0.62 03/13/2020   FREET4 0.71 09/08/2018   FREET4 0.61 01/08/2018   Lab Results  Component Value Date   T3FREE 3.0 03/13/2020   T3FREE 3.6 09/08/2018     Previous studies:   Her first biopsy was done in  09/2011 which had shown indeterminate cytology and this was confirmed to be benign on Affirma testing At that time her nodule was 2.8 cm  Needle aspiration done in 01/2015 indicated she had a 3 cm nodule that previously had been benign; this showed scanty cellular specimen along with colloid and degenerated macrophages In 08/2016 the needle aspiration biopsy showed a benign follicular adenoma and the nodule size was indicated at 3.7 cm  Lab Results  Component Value Date   TSH 0.28 (L) 03/13/2020   TSH 0.12 (A) 11/18/2019   TSH 0.52 09/08/2018   FREET4 0.62 03/13/2020   FREET4 0.71 09/08/2018   FREET4 0.61 01/08/2018     Allergies as of 06/30/2020      Reactions   Metronidazole Anaphylaxis      Medication List       Accurate as of June 30, 2020 10:07 AM. If you have any questions, ask your nurse or doctor.        cabergoline 0.5 MG tablet Commonly known as: DOSTINEX TAKE 1 TAB TWICE WEEK. TAKE 1/2 TABLET BY MOUTH ON SUNDAY AND 1/2 TABLET ON THURSDAY   desonide 0.05 % cream Commonly known as: DESOWEN Apply topically 2 (two) times daily.   FIBER PO Take by mouth.   FISH OIL PO Take by mouth.   fluconazole 150 MG tablet Commonly known as: DIFLUCAN Take 1 tablet now and then a second tablet in 72 hrs.   fluticasone 50 MCG/ACT nasal spray Commonly known as: FLONASE Place 2 sprays into both nostrils daily.   hydrocortisone 2.5 % cream Apply topically 2 (two) times daily.   MAGNESIUM PO Take by mouth.   multivitamin tablet Take 1 tablet by mouth daily.   naproxen sodium 550 MG tablet Commonly known as: Anaprox DS Take 1 tablet (550 mg total) by mouth 2 (two) times daily as needed.   nortriptyline 75 MG capsule Commonly known as: PAMELOR Take 1 capsule (75 mg total) by mouth at bedtime.   Olopatadine HCl 0.2 % Soln Apply 1 drop to eye daily.   omeprazole 40 MG capsule Commonly known as: PRILOSEC Take 1 capsule (40 mg total) by mouth daily.    Riboflavin-Magnesium-Feverfew 100-90-25 MG Tabs Take 100 mg by mouth daily.   Trulance 3 MG Tabs Generic drug: Plecanatide Take 1 tablet by mouth daily.   ZINC SULFATE-VITAMIN C MT Use as directed in the mouth or throat.       Allergies:  Allergies  Allergen Reactions  . Metronidazole Anaphylaxis    Past Medical History:  Diagnosis Date  . Acid reflux   . Colon polyp   . H. pylori infection   . History of seasonal allergies   . Hypercholesterolemia   . Migraines   . Pituitary tumor   . Thyroid nodule   . Vaginal Pap smear, abnormal     Past Surgical History:  Procedure Laterality Date  . CESAREAN SECTION    . OTHER SURGICAL HISTORY  hemangio removed at child and had something added to head to help indentation    Family History  Problem Relation Age of Onset  . Hypertension Mother   . Hypercholesterolemia Mother   . Colon polyps Maternal Grandmother   . Colon cancer Neg Hx   . Esophageal cancer Neg Hx   . Stomach cancer Neg Hx   . Rectal cancer Neg Hx     Social History:  reports that she has never smoked. She has never used smokeless tobacco. She reports current alcohol use. She reports that she does not use drugs.   Review of Systems  EXAM:  BP 108/72 (BP Location: Left Arm, Patient Position: Sitting, Cuff Size: Small)   Pulse 99   Temp 98.8 F (37.1 C) (Oral)   Resp 16   Ht 5\' 2"  (1.575 m)   Wt 170 lb (77.1 kg)   LMP 06/14/2020 (Exact Date)   SpO2 99%   BMI 31.09 kg/m   Physical Exam  Repeat pulse 90 regular  ASSESSMENT:   Prolactinoma since 2009 with history of sellar mass that was heterogenous and 7 mm in original size and presenting with headaches and galactorrhea.   Her symptoms previously were controlled when she is consistent with her cabergoline 0.5 mg half tablet twice a week  She still has symptoms of mastodynia even though her prolactin is back to normal with half tablet twice a week taken reportedly regularly No  oligomenorrhea Last MRI was in 1/21  Multinodular goiter, with a longstanding benign left-sided nodule, this was shown to be a benign follicular adenoma on 3 biopsies at intervals She also has had a slow growth of her thyroid nodule that was initially diagnosed in 2013 She may have a mildly autonomous thyroid nodule since TSH is low normal although not completely suppressed and no increasing T4 or T3    PLAN:  She will be given a trial of taking 1/2 tablet alternating with 1 tablet/week of the cabergoline for at least a month Also explained that since she has had irregular treatment for 2 years may take some time to reduce her mastodynia Follow-up in 4 months  Observation only for her nodule which is benign and not causing any pressure symptoms such as dysphagia or difficulty breathing, she is not keen on surgery at this point  Will need periodic thyroid functions also   Elayne Snare 06/30/20

## 2020-07-20 ENCOUNTER — Ambulatory Visit: Payer: PRIVATE HEALTH INSURANCE | Admitting: Dermatology

## 2020-07-25 ENCOUNTER — Encounter: Payer: Self-pay | Admitting: Student in an Organized Health Care Education/Training Program

## 2020-07-25 ENCOUNTER — Other Ambulatory Visit: Payer: Self-pay

## 2020-07-25 ENCOUNTER — Ambulatory Visit (INDEPENDENT_AMBULATORY_CARE_PROVIDER_SITE_OTHER): Payer: PRIVATE HEALTH INSURANCE | Admitting: Student in an Organized Health Care Education/Training Program

## 2020-07-25 DIAGNOSIS — Z711 Person with feared health complaint in whom no diagnosis is made: Secondary | ICD-10-CM | POA: Diagnosis not present

## 2020-07-25 DIAGNOSIS — N898 Other specified noninflammatory disorders of vagina: Secondary | ICD-10-CM | POA: Diagnosis not present

## 2020-07-25 DIAGNOSIS — L709 Acne, unspecified: Secondary | ICD-10-CM | POA: Diagnosis not present

## 2020-07-25 MED ORDER — CLINDAMYCIN PHOSPHATE 1 % EX SOLN: Freq: Two times a day (BID) | CUTANEOUS | 0 refills | Status: DC | PRN

## 2020-07-25 MED ORDER — MONISTAT 1-DAY 6.5 % VA OINT: 1.0000 | TOPICAL_OINTMENT | Freq: Once | VAGINAL | 0 refills | Status: AC

## 2020-07-25 NOTE — Progress Notes (Signed)
   SUBJECTIVE:   CHIEF COMPLAINT / HPI: yeast infection, medication refill  Yeast- has vaginal irritation similar to previous yeast infections. No recent antibiotic use. Took an old diflucan which was "expired" which improved her symptoms last week but not completely resolved so wants another treatment prescribed. She declines an exam today. Denies dysuria or other vaginal complaints/concerns.  Acne- requesting refill of topical clindamycin which has been beneficial to her in the past. Brings in the bottle today.  BP- concerned that her BP is too high. She has a cuff at home and monitors it frequently  OBJECTIVE:   BP 112/64   Pulse 83   Ht 5\' 2"  (1.575 m)   Wt 169 lb 6.4 oz (76.8 kg)   SpO2 99%   BMI 30.98 kg/m   General: NAD, pleasant, able to participate in exam Cardiac: RRR, normal heart sounds, no murmurs. 2+ radial and PT pulses bilaterally Respiratory: normal effort Neuro: alert and oriented, no focal deficits Psych: Normal affect and mood  ASSESSMENT/PLAN:   Vaginal irritation Offered pelvic exam and testing for infection but patient declined today.  Since she already took diflucan and had improvement and symptoms similar to previous yeast infections, providing with prescription for suppository today. Asked patient to return for pelvic exam and testing if symptoms not resolved with this treatment to look for alternate causes. She was understanding and agreed.  Acne Prescribed clindamycin topical 1%  Physically well but worried BP normal today. Discussed this with patient as well as described in detail normal blood pressure ranges and the variability that is normal in life.  She is asymptomatic and has normal reported readings from home monitor. Provided reassurance and recommend follow up if having frequent abnormal readings.     [place]

## 2020-07-25 NOTE — Patient Instructions (Signed)
It was a pleasure to see you today!  To summarize our discussion for this visit:  For your yeast symptoms, I am prescribing a topical suppository you can use tonight. If your symptoms are not resolved with this, I would highly recommend you get a pelvic exam done to confirm the diagnosis before further treatments.   I have filled your clindamycin cream  Your blood pressure looks very normal and healthy today. Please follow up with your PCP for your physical to recheck  Some additional health maintenance measures we should update are: Health Maintenance Due  Topic Date Due  . COVID-19 Vaccine (2 - Booster for [employer] series) 10/09/2019  . INFLUENZA VACCINE  Never done  .   Call the clinic at [phone] if your symptoms worsen or you have any concerns.   Thank you for allowing me to take part in your care,  Dr. Doristine Mango

## 2020-07-26 DIAGNOSIS — L709 Acne, unspecified: Secondary | ICD-10-CM | POA: Insufficient documentation

## 2020-07-26 DIAGNOSIS — Z711 Person with feared health complaint in whom no diagnosis is made: Secondary | ICD-10-CM | POA: Insufficient documentation

## 2020-07-26 DIAGNOSIS — N898 Other specified noninflammatory disorders of vagina: Secondary | ICD-10-CM | POA: Insufficient documentation

## 2020-07-26 NOTE — Assessment & Plan Note (Signed)
Prescribed clindamycin topical 1%

## 2020-07-26 NOTE — Assessment & Plan Note (Signed)
BP normal today. Discussed this with patient as well as described in detail normal blood pressure ranges and the variability that is normal in life.  She is asymptomatic and has normal reported readings from home monitor. Provided reassurance and recommend follow up if having frequent abnormal readings.

## 2020-07-26 NOTE — Assessment & Plan Note (Signed)
Offered pelvic exam and testing for infection but patient declined today.  Since she already took diflucan and had improvement and symptoms similar to previous yeast infections, providing with prescription for suppository today. Asked patient to return for pelvic exam and testing if symptoms not resolved with this treatment to look for alternate causes. She was understanding and agreed.

## 2020-08-01 ENCOUNTER — Other Ambulatory Visit: Payer: Self-pay

## 2020-08-01 ENCOUNTER — Encounter: Payer: Self-pay | Admitting: Family Medicine

## 2020-08-01 ENCOUNTER — Other Ambulatory Visit (HOSPITAL_COMMUNITY)
Admission: RE | Admit: 2020-08-01 | Discharge: 2020-08-01 | Disposition: A | Discharge status: Home / Self Care | Payer: PRIVATE HEALTH INSURANCE | Source: Ambulatory Visit | Attending: Family Medicine | Admitting: Family Medicine

## 2020-08-01 ENCOUNTER — Ambulatory Visit (INDEPENDENT_AMBULATORY_CARE_PROVIDER_SITE_OTHER): Payer: PRIVATE HEALTH INSURANCE | Admitting: Family Medicine

## 2020-08-01 VITALS — BP 116/72 | HR 84 | Wt 168.5 lb

## 2020-08-01 DIAGNOSIS — N898 Other specified noninflammatory disorders of vagina: Secondary | ICD-10-CM | POA: Insufficient documentation

## 2020-08-01 DIAGNOSIS — L299 Pruritus, unspecified: Secondary | ICD-10-CM

## 2020-08-01 DIAGNOSIS — R350 Frequency of micturition: Secondary | ICD-10-CM | POA: Diagnosis not present

## 2020-08-01 LAB — POCT URINALYSIS DIP (DEVICE)
Bilirubin Urine: NEGATIVE
Glucose, UA: NEGATIVE mg/dL
Ketones, ur: NEGATIVE mg/dL
Leukocytes,Ua: NEGATIVE
Nitrite: NEGATIVE
Protein, ur: NEGATIVE mg/dL
Specific Gravity, Urine: 1.02 (ref 1.005–1.030)
Urobilinogen, UA: 0.2 mg/dL (ref 0.0–1.0)
pH: 7 (ref 5.0–8.0)

## 2020-08-01 LAB — POCT PREGNANCY, URINE: Preg Test, Ur: NEGATIVE

## 2020-08-01 MED ORDER — FLUCONAZOLE 150 MG PO TABS: ORAL_TABLET | ORAL | 2 refills | Status: DC

## 2020-08-01 NOTE — Progress Notes (Signed)
GYNECOLOGY OFFICE VISIT NOTE  History:   Kristin Daniels is a 45 y.o. [phone] here today for vaginal discharge and urinary urgency.  Patient history notable for CS x1, up to date on STI screening and pap/mammo Seen in [facility] on 07/26/2020 for similar symptoms of vaginal discharge, given topical treatment for yeast as she had already taken an old diflucan  She reports today she continues to have an off white discharge despite topical therapy No odor Uses condoms for birth control, no unprotected sex, no pregnancy test recently No vaginal itching  Also having intermittent lower abdominal cramping Located centrally Has had for the past two weeks Still has regular period Patient's last menstrual period was 07/10/2020 (exact date).  Also feeling pressure when she does pee Needs to go again very soon after urinating No leakage of urine with coughing or sneezing No leakage of urine if she doesn't get to the bathroom in time Increased urinary frequency but has been drinking more water of late to flush out her system No burning or pain with urination Has had UTI in the past, doesn't think this feels like that   Past Medical History:  Diagnosis Date  . Acid reflux   . Colon polyp   . H. pylori infection   . History of seasonal allergies   . Hypercholesterolemia   . Migraines   . Pituitary tumor   . Thyroid nodule   . Vaginal Pap smear, abnormal     Past Surgical History:  Procedure Laterality Date  . CESAREAN SECTION    . OTHER SURGICAL HISTORY     hemangio removed at child and had something added to head to help indentation    The following portions of the patient's history were reviewed and updated as appropriate: allergies, current medications, past family history, past medical history, past social history, past surgical history and problem list.   Health Maintenance:  Normal pap and negative HRHPV: 12/23/2019.  Normal mammogram: 12/29/2019.   Review of Systems:  Pertinent  items noted in HPI and remainder of comprehensive ROS otherwise negative.  Physical Exam:  BP 116/72   Pulse 84   Wt 168 lb 8 oz (76.4 kg)   LMP 07/10/2020 (Exact Date)   BMI 30.82 kg/m  CONSTITUTIONAL: Well-developed, well-nourished female in no acute distress.  HEENT:  Normocephalic, atraumatic. External right and left ear normal. No scleral icterus.  NECK: Normal range of motion, supple, no masses noted on observation SKIN: No rash noted. Not diaphoretic. No erythema. No pallor. MUSCULOSKELETAL: Normal range of motion. No edema noted. NEUROLOGIC: Alert and oriented to person, place, and time. Normal muscle tone coordination.  PSYCHIATRIC: Normal mood and affect. Normal behavior. Normal judgment and thought content. RESPIRATORY: Effort normal, no problems with respiration noted ABDOMEN: No masses noted. No other overt distention noted.   PELVIC: Normal appearing external genitalia; normal appearing vaginal mucosa and cervix with small amount of physiologic discharge. Uterus very mildly enlarged ?fibroid?, no CMT, no uterine or adnexal tenderness.  Labs and Imaging Results for orders placed or performed in visit on 08/01/20 (from the past 168 hour(s))  POCT urinalysis dip (device)   Collection Time: 08/01/20 10:44 AM  Result Value Ref Range   Glucose, UA NEGATIVE NEGATIVE mg/dL   Bilirubin Urine NEGATIVE NEGATIVE   Ketones, ur NEGATIVE NEGATIVE mg/dL   Specific Gravity, Urine 1.020 1.005 - 1.030   Hgb urine dipstick TRACE (A) NEGATIVE   pH 7.0 5.0 - 8.0   Protein, ur NEGATIVE NEGATIVE mg/dL  Urobilinogen, UA 0.2 0.0 - 1.0 mg/dL   Nitrite NEGATIVE NEGATIVE   Leukocytes,Ua NEGATIVE NEGATIVE  Pregnancy, urine POC   Collection Time: 08/01/20 11:06 AM  Result Value Ref Range   Preg Test, Ur NEGATIVE NEGATIVE   No results found.    Assessment and Plan:   Problem List Items Addressed This Visit   None   Visit Diagnoses    Vaginal discharge    -  Primary   Relevant Orders    Cervicovaginal ancillary only( [place])   Urinary frequency       Relevant Orders   Urine Culture   Itching       Relevant Medications   fluconazole (DIFLUCAN) 150 MG tablet     #Vaginal Discharge Appears physiologic, reassured patient and suggested we send swab and defer treatment until we have results.   #Urinary frequency No symptoms of stress or urge incontinence. May have some UTI symptoms but UA was unremarkable. Recommended we wait for culture results, patient in agreement.   #Abdominal cramping Possibly related to fibroid palpated on bimanual, only present for two weeks and only mild symptoms. Recommended additional two weeks of observation, if still persistent can get TVUS at that time, patient will message me by mychart if symptoms persist.   #History of yeast vaginitis Patient requesting on hand doses of fluconazole, instructed not to take unless she develops classic symptoms, patient in agreement.    Return if symptoms worsen or fail to improve.    Total face-to-face time with patient: 20 minutes.  Over 50% of encounter was spent on counseling and coordination of care.   Clarnce Flock, MD/[facility] [employer], [place]

## 2020-08-01 NOTE — Patient Instructions (Signed)
Preventive Care 84-45 Years Old, Female Preventive care refers to lifestyle choices and visits with your health care provider that can promote health and wellness. This includes:  A yearly physical exam. This is also called an annual wellness visit.  Regular dental and eye exams.  Immunizations.  Screening for certain conditions.  Healthy lifestyle choices, such as: ? Eating a healthy diet. ? Getting regular exercise. ? Not using drugs or products that contain nicotine and tobacco. ? Limiting alcohol use. What can I expect for my preventive care visit? Physical exam Your health care provider will check your:  Height and weight. These may be used to calculate your BMI (body mass index). BMI is a measurement that tells if you are at a healthy weight.  Heart rate and blood pressure.  Body temperature.  Skin for abnormal spots. Counseling Your health care provider may ask you questions about your:  Past medical problems.  Family's medical history.  Alcohol, tobacco, and drug use.  Emotional well-being.  Home life and relationship well-being.  Sexual activity.  Diet, exercise, and sleep habits.  Work and work Statistician.  Access to firearms.  Method of birth control.  Menstrual cycle.  Pregnancy history. What immunizations do I need? Vaccines are usually given at various ages, according to a schedule. Your health care provider will recommend vaccines for you based on your age, medical history, and lifestyle or other factors, such as travel or where you work.   What tests do I need? Blood tests  Lipid and cholesterol levels. These may be checked every 5 years, or more often if you are over 45 years old.  Hepatitis C test.  Hepatitis B test. Screening  Lung cancer screening. You may have this screening every year starting at age 45 if you have a 30-pack-year history of smoking and currently smoke or have quit within the past 15 years.  Colorectal cancer  screening. ? All adults should have this screening starting at age 45 and continuing until age 17. ? Your health care provider may recommend screening at age 45 if you are at increased risk. ? You will have tests every 1-10 years, depending on your results and the type of screening test.  Diabetes screening. ? This is done by checking your blood sugar (glucose) after you have not eaten for a while (fasting). ? You may have this done every 1-3 years.  Mammogram. ? This may be done every 1-2 years. ? Talk with your health care provider about when you should start having regular mammograms. This may depend on whether you have a family history of breast cancer.  BRCA-related cancer screening. This may be done if you have a family history of breast, ovarian, tubal, or peritoneal cancers.  Pelvic exam and Pap test. ? This may be done every 3 years starting at age 45. ? Starting at age 45, this may be done every 5 years if you have a Pap test in combination with an HPV test. Other tests  STD (sexually transmitted disease) testing, if you are at risk.  Bone density scan. This is done to screen for osteoporosis. You may have this scan if you are at high risk for osteoporosis. Talk with your health care provider about your test results, treatment options, and if necessary, the need for more tests. Follow these instructions at home: Eating and drinking  Eat a diet that includes fresh fruits and vegetables, whole grains, lean protein, and low-fat dairy products.  Take vitamin and mineral supplements  as recommended by your health care provider.  Do not drink alcohol if: ? Your health care provider tells you not to drink. ? You are pregnant, may be pregnant, or are planning to become pregnant.  If you drink alcohol: ? Limit how much you have to 0-1 drink a day. ? Be aware of how much alcohol is in your drink. In the [place] one drink equals one 12 oz bottle of beer (355 mL), one 5 oz glass of  wine (148 mL), or one 1 oz glass of hard liquor (44 mL).   Lifestyle  Take daily care of your teeth and gums. Brush your teeth every morning and night with fluoride toothpaste. Floss one time each day.  Stay active. Exercise for at least 30 minutes 5 or more days each week.  Do not use any products that contain nicotine or tobacco, such as cigarettes, e-cigarettes, and chewing tobacco. If you need help quitting, ask your health care provider.  Do not use drugs.  If you are sexually active, practice safe sex. Use a condom or other form of protection to prevent STIs (sexually transmitted infections).  If you do not wish to become pregnant, use a form of birth control. If you plan to become pregnant, see your health care provider for a prepregnancy visit.  If told by your health care provider, take low-dose aspirin daily starting at age 45.  Find healthy ways to cope with stress, such as: ? Meditation, yoga, or listening to music. ? Journaling. ? Talking to a trusted person. ? Spending time with friends and family. Safety  Always wear your seat belt while driving or riding in a vehicle.  Do not drive: ? If you have been drinking alcohol. Do not ride with someone who has been drinking. ? When you are tired or distracted. ? While texting.  Wear a helmet and other protective equipment during sports activities.  If you have firearms in your house, make sure you follow all gun safety procedures. What's next?  Visit your health care provider once a year for an annual wellness visit.  Ask your health care provider how often you should have your eyes and teeth checked.  Stay up to date on all vaccines. This information is not intended to replace advice given to you by your health care provider. Make sure you discuss any questions you have with your health care provider. Document Revised: 01/25/2020 Document Reviewed: 01/01/2018 Elsevier Patient Education  2021 Elsevier Inc.  

## 2020-08-01 NOTE — Progress Notes (Signed)
Complains of "off white" vaginal discharge and said that she has a urgency to pee. [place] as though she may have a UTI. Pelvic cramps that she rates 4. This all started about 2 weeks ago.

## 2020-08-02 LAB — CERVICOVAGINAL ANCILLARY ONLY
Bacterial Vaginitis (gardnerella): NEGATIVE
Candida Glabrata: NEGATIVE
Candida Vaginitis: NEGATIVE
Chlamydia: NEGATIVE
Comment: NEGATIVE
Comment: NEGATIVE
Comment: NEGATIVE
Comment: NEGATIVE
Comment: NEGATIVE
Comment: NORMAL
Neisseria Gonorrhea: NEGATIVE
Trichomonas: NEGATIVE

## 2020-08-03 DIAGNOSIS — R102 Pelvic and perineal pain: Secondary | ICD-10-CM

## 2020-08-04 LAB — URINE CULTURE

## 2020-08-05 MED ORDER — NITROFURANTOIN MONOHYD MACRO 100 MG PO CAPS: 100.0000 mg | ORAL_CAPSULE | Freq: Two times a day (BID) | ORAL | 0 refills | Status: AC

## 2020-08-05 NOTE — Addendum Note (Signed)
Addended by: Clayton Lefort on: 08/05/2020 12:37 PM   Modules accepted: Orders

## 2020-08-09 ENCOUNTER — Encounter: Payer: PRIVATE HEALTH INSURANCE | Admitting: Family Medicine

## 2020-08-09 ENCOUNTER — Ambulatory Visit: Payer: PRIVATE HEALTH INSURANCE | Admitting: Plastic Surgery

## 2020-08-28 ENCOUNTER — Other Ambulatory Visit: Payer: Self-pay

## 2020-08-28 ENCOUNTER — Ambulatory Visit (INDEPENDENT_AMBULATORY_CARE_PROVIDER_SITE_OTHER): Payer: PRIVATE HEALTH INSURANCE | Admitting: Family Medicine

## 2020-08-28 ENCOUNTER — Other Ambulatory Visit (HOSPITAL_COMMUNITY)
Admission: RE | Admit: 2020-08-28 | Discharge: 2020-08-28 | Disposition: A | Discharge status: Home / Self Care | Payer: BC Managed Care – PPO | Source: Ambulatory Visit | Attending: Family Medicine | Admitting: Family Medicine

## 2020-08-28 VITALS — BP 112/76 | HR 98 | Temp 98.9°F

## 2020-08-28 DIAGNOSIS — R350 Frequency of micturition: Secondary | ICD-10-CM

## 2020-08-28 DIAGNOSIS — J301 Allergic rhinitis due to pollen: Secondary | ICD-10-CM

## 2020-08-28 DIAGNOSIS — J029 Acute pharyngitis, unspecified: Secondary | ICD-10-CM | POA: Insufficient documentation

## 2020-08-28 LAB — POCT RAPID STREP A (OFFICE): Rapid Strep A Screen: NEGATIVE

## 2020-08-28 LAB — POCT URINALYSIS DIP (MANUAL ENTRY)
Bilirubin, UA: NEGATIVE
Glucose, UA: NEGATIVE mg/dL
Leukocytes, UA: NEGATIVE
Nitrite, UA: NEGATIVE
Protein Ur, POC: NEGATIVE mg/dL
Spec Grav, UA: 1.02 (ref 1.010–1.025)
Urobilinogen, UA: 0.2 E.U./dL
pH, UA: 6 (ref 5.0–8.0)

## 2020-08-28 MED ORDER — FLUTICASONE PROPIONATE 50 MCG/ACT NA SUSP: 2.0000 | Freq: Every day | NASAL | 6 refills | Status: DC

## 2020-08-28 MED ORDER — CETIRIZINE HCL 10 MG PO TABS: 10.0000 mg | ORAL_TABLET | Freq: Every day | ORAL | 11 refills | Status: DC

## 2020-08-28 NOTE — Patient Instructions (Signed)
Thank you for coming to see me today. It was a pleasure. Today we talked about:   We will test your throat for gonorrhea and chlamydia in your throat.  It is most likely from allergies.  Use zyrtec daily and flonase 2 sprays in each nostril daily.  If you aren't getting better over the next month, come back to see [place].  I will send your urine for a culture, we will let you know if you have an infection.  Please follow-up with PCP in 1 month if not better.  If you have any questions or concerns, please do not hesitate to call the office at [phone].  Best,   [place] Constable, DO

## 2020-08-28 NOTE — Assessment & Plan Note (Signed)
No obvious UTI on UA.  Will obtain a urine culture to further assess.  No vaginal complaints.  She recently had STD testing.  We will treat based off of culture results.

## 2020-08-28 NOTE — Assessment & Plan Note (Signed)
Likely cause of patient's sore throat.  We will trial Flonase 2 sprays each nostril daily.  Also start Zyrtec.  Follow-up in 1 month if no improvement.  Can use warm beverages with honey as well to soothe the throat.

## 2020-08-28 NOTE — Assessment & Plan Note (Signed)
Rapid strep negative, would not perform a culture given that there is very low risk that she has strep.  No signs on examination.  Did perform gonorrhea/chlamydia throat swab to rule out.  Most likely related to allergic rhinitis given her examination consistent with this.  See treatment per below.

## 2020-08-28 NOTE — Progress Notes (Signed)
SUBJECTIVE:   CHIEF COMPLAINT / HPI:   DYSURIA Pain urinating started about a month ago. Pain is: pressure, no burning Medications tried: Nitrofurantoin for 5 days in March Any antibiotics in the last 30 days: yes, per above More than 3 UTIs in the last 12 months: no STD exposure: none Possibly pregnant: no, no contraception Patient's last menstrual period was 08/08/2020. No unprotected sex since then  Symptoms Urgency: yes Frequency: no Blood in urine: no Pain in back: before she was treated the first time Fever: no Vaginal discharge: no change, wet prep and G/C was normal then Mouth Ulcers: none  Sore throat 2 months No fevers A little cough and congestion with this Sometimes has pain in her neck as well Has known thyroid nodules, has had prior biopsy in [place] Follows with Dr. Dwyane Dee, Endo Constant pain No coughing at night Having some rhinorrhea as well  No itchy or watery eyes Having some left ear pain No known sick contacts Has a history of seasonal allergies, hasn't used flonase in awhile Doesn't use any other seasonal allergies   PERTINENT  PMH / PSH: Allergic rhinitis, constipation, history of pituitary tumor, enlarged thyroid gland  OBJECTIVE:   BP 112/76   Pulse 98   Temp 98.9 F (37.2 C) (Oral)   LMP 08/08/2020   SpO2 98%    Physical Exam:  General: 45 y.o. female in NAD HEENT: NCAT, bilateral nasal turbinates swollen, clear rhinorrhea, b/l TMs clear and non-bulging, throat with cobblestoning, non-erythematous Neck: no cervical LAD, left sided nodule, non-tender thyroid Abdomen: no CVA tenderness b/l Skin: warm and dry Extremities: Ambulating without difficulty   Results for orders placed or performed in visit on 08/28/20 (from the past 24 hour(s))  POCT urinalysis dipstick     Status: Abnormal   Collection Time: 08/28/20  4:48 PM  Result Value Ref Range   Color, UA yellow yellow   Clarity, UA clear clear   Glucose, UA negative  negative mg/dL   Bilirubin, UA negative negative   Ketones, POC UA small (15) (A) negative mg/dL   Spec Grav, UA 1.020 1.010 - 1.025   Blood, UA trace-lysed (A) negative   pH, UA 6.0 5.0 - 8.0   Protein Ur, POC negative negative mg/dL   Urobilinogen, UA 0.2 0.2 or 1.0 E.U./dL   Nitrite, UA Negative Negative   Leukocytes, UA Negative Negative  POCT rapid strep A     Status: None   Collection Time: 08/28/20  4:50 PM  Result Value Ref Range   Rapid Strep A Screen Negative Negative     ASSESSMENT/PLAN:   Urinary frequency No obvious UTI on UA.  Will obtain a urine culture to further assess.  No vaginal complaints.  She recently had STD testing.  We will treat based off of culture results.  Sore throat Rapid strep negative, would not perform a culture given that there is very low risk that she has strep.  No signs on examination.  Did perform gonorrhea/chlamydia throat swab to rule out.  Most likely related to allergic rhinitis given her examination consistent with this.  See treatment per below.  Seasonal allergic rhinitis due to pollen Likely cause of patient's sore throat.  We will trial Flonase 2 sprays each nostril daily.  Also start Zyrtec.  Follow-up in 1 month if no improvement.  Can use warm beverages with honey as well to soothe the throat.     Cleophas Dunker, [place]

## 2020-08-30 LAB — CERVICOVAGINAL ANCILLARY ONLY
Chlamydia: NEGATIVE
Comment: NEGATIVE
Comment: NORMAL
Neisseria Gonorrhea: NEGATIVE

## 2020-08-31 LAB — URINE CULTURE

## 2020-09-06 ENCOUNTER — Ambulatory Visit: Payer: PRIVATE HEALTH INSURANCE | Admitting: Plastic Surgery

## 2020-09-26 ENCOUNTER — Other Ambulatory Visit: Payer: Self-pay | Admitting: Family Medicine

## 2020-09-26 DIAGNOSIS — E221 Hyperprolactinemia: Secondary | ICD-10-CM

## 2020-09-26 DIAGNOSIS — Z87898 Personal history of other specified conditions: Secondary | ICD-10-CM

## 2020-09-29 ENCOUNTER — Telehealth: Payer: Self-pay | Admitting: Endocrinology

## 2020-09-29 NOTE — Telephone Encounter (Signed)
Patient called re: Patient is having Prior Labs done at PCP on 10/23/20-Patient requests Dr. Dwyane Dee send his lab orders to Dr. Milus Banister at [facility] so that Patient does not have to have her labs done twice.Kristin Daniels

## 2020-09-30 NOTE — Telephone Encounter (Signed)
Please advise 

## 2020-10-02 NOTE — Telephone Encounter (Signed)
Labs already in epic

## 2020-10-03 ENCOUNTER — Encounter: Payer: PRIVATE HEALTH INSURANCE | Admitting: Family Medicine

## 2020-10-03 NOTE — Telephone Encounter (Signed)
Notified patient that Dr Ronnie Derby lab order is the system. Her PCP can viewed them as they are on Epic as well.

## 2020-10-16 ENCOUNTER — Encounter: Payer: PRIVATE HEALTH INSURANCE | Admitting: Family Medicine

## 2020-10-23 ENCOUNTER — Encounter: Payer: Self-pay | Admitting: Family Medicine

## 2020-10-23 ENCOUNTER — Other Ambulatory Visit: Payer: Self-pay

## 2020-10-23 ENCOUNTER — Ambulatory Visit (HOSPITAL_COMMUNITY)
Admission: RE | Admit: 2020-10-23 | Discharge: 2020-10-23 | Disposition: A | Discharge status: Home / Self Care | Payer: BC Managed Care – PPO | Source: Ambulatory Visit | Attending: Family Medicine | Admitting: Family Medicine

## 2020-10-23 ENCOUNTER — Other Ambulatory Visit (HOSPITAL_COMMUNITY): Payer: PRIVATE HEALTH INSURANCE

## 2020-10-23 ENCOUNTER — Ambulatory Visit
Admission: RE | Admit: 2020-10-23 | Discharge: 2020-10-23 | Disposition: A | Discharge status: Home / Self Care | Payer: PRIVATE HEALTH INSURANCE | Source: Ambulatory Visit | Attending: Family Medicine | Admitting: Family Medicine

## 2020-10-23 ENCOUNTER — Ambulatory Visit (INDEPENDENT_AMBULATORY_CARE_PROVIDER_SITE_OTHER): Payer: PRIVATE HEALTH INSURANCE | Admitting: Family Medicine

## 2020-10-23 VITALS — BP 115/62 | HR 81 | Ht 62.0 in | Wt 165.6 lb

## 2020-10-23 DIAGNOSIS — L299 Pruritus, unspecified: Secondary | ICD-10-CM | POA: Diagnosis not present

## 2020-10-23 DIAGNOSIS — Z01818 Encounter for other preprocedural examination: Secondary | ICD-10-CM | POA: Insufficient documentation

## 2020-10-23 DIAGNOSIS — E042 Nontoxic multinodular goiter: Secondary | ICD-10-CM | POA: Diagnosis not present

## 2020-10-23 DIAGNOSIS — Z114 Encounter for screening for human immunodeficiency virus [HIV]: Secondary | ICD-10-CM | POA: Diagnosis not present

## 2020-10-23 DIAGNOSIS — R350 Frequency of micturition: Secondary | ICD-10-CM

## 2020-10-23 DIAGNOSIS — E221 Hyperprolactinemia: Secondary | ICD-10-CM | POA: Diagnosis not present

## 2020-10-23 LAB — POCT URINALYSIS DIP (MANUAL ENTRY)
Bilirubin, UA: NEGATIVE
Blood, UA: NEGATIVE
Glucose, UA: NEGATIVE mg/dL
Leukocytes, UA: NEGATIVE
Nitrite, UA: NEGATIVE
Protein Ur, POC: NEGATIVE mg/dL
Spec Grav, UA: 1.02 (ref 1.010–1.025)
Urobilinogen, UA: 0.2 E.U./dL
pH, UA: 6 (ref 5.0–8.0)

## 2020-10-23 MED ORDER — FLUCONAZOLE 150 MG PO TABS: ORAL_TABLET | ORAL | 2 refills | Status: DC

## 2020-10-23 MED ORDER — CLINDAMYCIN PHOSPHATE 1 % EX SOLN: Freq: Two times a day (BID) | CUTANEOUS | 2 refills | Status: DC | PRN

## 2020-10-23 NOTE — Progress Notes (Signed)
Patient Name: Kristin Daniels Date of Birth: 1975/07/25 Date of Visit: 10/23/20 PCP: Daisy Floro, DO  Chief Complaint: preoperative evaluation Surgeon:  Dr. Pearla Dubonnet [facility]) Surgery: BBL, Liposuction 360, arm Liposuction Date of Procedure: November 14, 2020  Subjective: Kristin Daniels is a pleasant 45 y.o. with medical history significant for Pituitary adenoma, H. Pylori, seasonal allergies, and constipation presenting today for preoperative clearance for cosmetic procedures.   The patient can walk "unlimited" city blocks and or up 2 flights of stairs without difficulty.    Prior surgeries:  -Liposuction (2017) -Scar revision on scalp birth mark (2010) -Foot surgery (bunionectomy) (2008) -C-Section (2011) -Wisdom teeth removed due to impaction (?2006)   Difficulty with surgical procedures in the past: None History of difficulty with anesthesia: None History of venous thromboembolic disease: None History of easy bleeding with procedures: None Family history of venous thromboembolic disease:None Family history of bleeding diathesis: None that she knows of    ROS:  Denies chest pain, dyspnea on exertion, chronic cough, history of venous thromboembolism, family history of venous thromboembolism, personal or family history of easy bleeding or bruising, or personal history or family history of difficulty with anesthesia.  I have reviewed the patient's medical, surgical, family, and social history as appropriate.   Vitals:   10/23/20 1034  BP: 115/62  Pulse: 81  SpO2: 92%   Filed Weights   10/23/20 1034  Weight: 165 lb 9.6 oz (75.1 kg)    Physical exam: General: Well-appearing patient, no acute distress, nontoxic-appearing HEENT: Normal, atraumatic, EOMI, PERRLA, red reflex appreciated bilaterally, normal-appearing tympanic membranes with good cone of light and landmarks; patent nares with mildly boggy nasal turbinates, no pharyngeal erythema or tonsillar  exudates Respiratory: CTA bilaterally, comfortable work of breathing on room air Cardio: RRR, S1-S2 present, no murmurs appreciated Abdomen: Normal bowel sounds appreciated, soft to palpation, nontender to palpation   [place] was seen today for annual exam.  Diagnoses and all orders for this visit:  Preoperative evaluation to rule out surgical contraindication -     CBC with Differential -     Comprehensive metabolic panel -     Protime-INR -     APTT -     hCG, serum, qualitative -     DG Chest 2 View; Future -     DG Chest 2 View -     EKG 12-Lead  Encounter for screening for HIV -     HIV antibody (with reflex)  Itching -     fluconazole (DIFLUCAN) 150 MG tablet; Take 1 tablet now and then a second tablet in 72 hrs.  Urinary frequency -     Urinalysis Dipstick  Hyperprolactinemia (HCC) -     Prolactin  Multinodular goiter -     T3, free -     T4, free -     TSH  Other orders -     clindamycin (CLEOCIN-T) 1 % external solution; Apply topically 2 (two) times daily as needed.   Patient is LOW risk for a moderate risk surgery.  The patient has been medically optimized for this procedure. In addition,  I make the following recommendations for further evaluation and medication adjustments prior to their planned procedure: None   Send to: Clent Demark - surgical coordinator [email or web address]  Urinary frequency: patient concerned for some urinary frequency.  She denies dysuria, abdominal pain, urinary incontinence or urgency.  She is kindly requesting evaluation with urinalysis.   Milus Banister, [place], PGY-3 10/23/2020 1:40  PM

## 2020-10-23 NOTE — Patient Instructions (Signed)
Thank you for coming in to see [place] today! Please see below to review our plan for today's visit:  1. We are checking all of your pre-op labs, ekg. Go to [facility] for Chest xray.  2. Your meds have been refilled and sent to your pharmacy.     Please call the clinic at [phone] if your symptoms worsen or you have any concerns. It was our pleasure to serve you!    Dr. Milus Banister [facility]

## 2020-10-24 ENCOUNTER — Encounter: Payer: Self-pay | Admitting: Family Medicine

## 2020-10-24 ENCOUNTER — Other Ambulatory Visit (HOSPITAL_COMMUNITY): Payer: PRIVATE HEALTH INSURANCE

## 2020-10-24 ENCOUNTER — Other Ambulatory Visit: Payer: PRIVATE HEALTH INSURANCE

## 2020-10-24 LAB — CBC WITH DIFFERENTIAL/PLATELET
Basophils Absolute: 0 10*3/uL (ref 0.0–0.2)
Basos: 1 %
EOS (ABSOLUTE): 0 10*3/uL (ref 0.0–0.4)
Eos: 0 %
Hematocrit: 36.9 % (ref 34.0–46.6)
Hemoglobin: 12.3 g/dL (ref 11.1–15.9)
Immature Grans (Abs): 0 10*3/uL (ref 0.0–0.1)
Immature Granulocytes: 0 %
Lymphocytes Absolute: 1.9 10*3/uL (ref 0.7–3.1)
Lymphs: 32 %
MCH: 27.8 pg (ref 26.6–33.0)
MCHC: 33.3 g/dL (ref 31.5–35.7)
MCV: 83 fL (ref 79–97)
Monocytes Absolute: 0.4 10*3/uL (ref 0.1–0.9)
Monocytes: 7 %
Neutrophils Absolute: 3.5 10*3/uL (ref 1.4–7.0)
Neutrophils: 60 %
Platelets: 270 10*3/uL (ref 150–450)
RBC: 4.43 x10E6/uL (ref 3.77–5.28)
RDW: 12.6 % (ref 11.7–15.4)
WBC: 5.9 10*3/uL (ref 3.4–10.8)

## 2020-10-24 LAB — COMPREHENSIVE METABOLIC PANEL
ALT: 15 IU/L (ref 0–32)
AST: 13 IU/L (ref 0–40)
Albumin/Globulin Ratio: 1.8 (ref 1.2–2.2)
Albumin: 4.6 g/dL (ref 3.8–4.8)
Alkaline Phosphatase: 62 IU/L (ref 44–121)
BUN/Creatinine Ratio: 13 (ref 9–23)
BUN: 9 mg/dL (ref 6–24)
Bilirubin Total: 0.4 mg/dL (ref 0.0–1.2)
CO2: 20 mmol/L (ref 20–29)
Calcium: 9.4 mg/dL (ref 8.7–10.2)
Chloride: 104 mmol/L (ref 96–106)
Creatinine, Ser: 0.69 mg/dL (ref 0.57–1.00)
Globulin, Total: 2.5 g/dL (ref 1.5–4.5)
Glucose: 84 mg/dL (ref 65–99)
Potassium: 4 mmol/L (ref 3.5–5.2)
Sodium: 140 mmol/L (ref 134–144)
Total Protein: 7.1 g/dL (ref 6.0–8.5)
eGFR: 109 mL/min/{1.73_m2} (ref >59)

## 2020-10-24 LAB — TSH+FREE T4
Free T4: 0.85 ng/dL (ref 0.82–1.77)
TSH: 0.529 u[IU]/mL (ref 0.450–4.500)

## 2020-10-24 LAB — PROTIME-INR
INR: 1 (ref 0.9–1.2)
Prothrombin Time: 10.4 s (ref 9.1–12.0)

## 2020-10-24 LAB — HIV ANTIBODY (ROUTINE TESTING W REFLEX): HIV Screen 4th Generation wRfx: NONREACTIVE

## 2020-10-24 LAB — HCG, SERUM, QUALITATIVE: hCG,Beta Subunit,Qual,Serum: NEGATIVE m[IU]/mL (ref <6)

## 2020-10-24 LAB — PROLACTIN: Prolactin: 22.7 ng/mL (ref 4.8–23.3)

## 2020-10-24 LAB — APTT: aPTT: 31 s (ref 24–33)

## 2020-10-25 ENCOUNTER — Other Ambulatory Visit: Payer: Self-pay

## 2020-10-26 ENCOUNTER — Encounter: Payer: Self-pay | Admitting: Family Medicine

## 2020-10-26 LAB — T3, FREE: T3, Free: 2.5 pg/mL (ref 2.0–4.4)

## 2020-10-26 LAB — SPECIMEN STATUS REPORT

## 2020-10-27 ENCOUNTER — Encounter: Payer: Self-pay | Admitting: Endocrinology

## 2020-10-27 ENCOUNTER — Other Ambulatory Visit: Payer: Self-pay

## 2020-10-27 ENCOUNTER — Ambulatory Visit (INDEPENDENT_AMBULATORY_CARE_PROVIDER_SITE_OTHER): Payer: PRIVATE HEALTH INSURANCE | Admitting: Endocrinology

## 2020-10-27 VITALS — BP 124/82 | HR 86 | Ht 62.0 in | Wt 169.4 lb

## 2020-10-27 DIAGNOSIS — Z87898 Personal history of other specified conditions: Secondary | ICD-10-CM

## 2020-10-27 DIAGNOSIS — E042 Nontoxic multinodular goiter: Secondary | ICD-10-CM

## 2020-10-27 DIAGNOSIS — E221 Hyperprolactinemia: Secondary | ICD-10-CM

## 2020-10-27 MED ORDER — CABERGOLINE 0.5 MG PO TABS
ORAL_TABLET | ORAL | 1 refills | Status: DC
Start: 2020-10-27 — End: 2021-02-05

## 2020-10-27 NOTE — Progress Notes (Signed)
Patient ID: Kristin Daniels, female   DOB: Oct 21, 1975, 45 y.o.   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 to her physician with complaints of milky discharge from her breasts and headaches She had not had any late or missed menstrual cycles at that time She reportedly was diagnosed to have a prolactinoma and started on cabergoline She does not know what her baseline prolactin level was  She had been followed by an endocrinologist in [place] state with periodic lab work and MRIs and continued on cabergoline Only a few reports are available from previous records and not clear if her prolactin has been consistently controlled In 12/16 her prolactin was 45 About 6 months prior to her visit in December 2018 with her PCP she was told to start taking 1/2 tablet only once a week with her Dostinex  RECENT history: In late 2018 started having some headaches and also breast fullness and mild galactorrhea When her prolactin level was 29 she was told to increase her dosage to half tablet twice a week Subsequently symptoms improved  On her last visit she was complaining of having symptoms of her breasts swelling, feeling full and tender.  Also having milky discharge on expression but no change in menstrual cycles.  Even though her prolactin was normal she was told to try an extra half tablet weekly of the cabergoline She misunderstood and has been taking 1 tablet twice a week However in the last couple of weeks she has missed an occasional dose With the higher dose her symptoms of mastodynia have resolved and she has no galactorrhea also  Prolactin is high normal but she was late for her cabergoline last week  Prolactin levels:  Lab Results  Component Value Date   PROLACTIN 22.7 10/23/2020   PROLACTIN 17.7 06/21/2020   PROLACTIN 36.2 (H) 03/13/2020   PROLACTIN 11.7 09/08/2018    MRI of pituitary gland was done by neurologist in the 05/2019 which showed an 8 mm hyperintense mass in the posterior sella.  No comparison was made with prior MRI as below  MRI of pituitary gland as of 03/15/2016 shows a 7 mm posterior central left-sided lobulated pituitary adenoma extending to the insertion of the infundibulum which is slightly deviated towards the right  Past history:  During her pregnancy when she was not on regular medication she developed severe diabetes insipidus  which was controlled with her restarting treatment and she took initially bromocriptine and subsequently cabergoline during her pregnancy   PROBLEM 2:  Multinodular goiter.  She had this diagnosed on routine exam several years ago  No prior ultrasound reports are available when [place] was [place] out [place] and no description of her exam is available from previous notes.  However baseline size of the nodule on the left was 2.8 cm in 2013 Records have shown that she had needle aspiration biopsies of the left dominant nodule 3 times, see below.  Biopsies have been benign consistently including Afirma testing  Thyroid nodule measured 4.4 cm in 04/2020  No symptoms of local pressure sensation, difficulty swallowing or choking recently  In 11/21 her nodule was about 3-3.5 cm on exam  Her thyroid levels have been variable with tendency to low normal or low TSH levels without increase in free T4 or T3 levels TSH is now back to normal  Lab  Results  Component Value Date   TSH 0.529 10/23/2020   TSH 0.28 (L) 03/13/2020   TSH 0.12 (A) 11/18/2019   FREET4 0.85 10/23/2020   FREET4 0.62 03/13/2020   FREET4 0.71 09/08/2018   Lab Results  Component Value Date   T3FREE 2.5 10/23/2020   T3FREE 3.0 03/13/2020   T3FREE 3.6 09/08/2018     Previous studies:   Her first biopsy was done in 09/2011 which had shown indeterminate cytology and this was confirmed to be benign on Affirma testing At that time  her nodule was 2.8 cm  Needle aspiration done in 01/2015 indicated she had a 3 cm nodule that previously had been benign; this showed scanty cellular specimen along with colloid and degenerated macrophages In 08/2016 the needle aspiration biopsy showed a benign follicular adenoma and the nodule size was indicated at 3.7 cm  Lab Results  Component Value Date   TSH 0.529 10/23/2020   TSH 0.28 (L) 03/13/2020   TSH 0.12 (A) 11/18/2019   FREET4 0.85 10/23/2020   FREET4 0.62 03/13/2020   FREET4 0.71 09/08/2018     Allergies as of 10/27/2020       Reactions   Metronidazole Anaphylaxis        Medication List        Accurate as of October 27, 2020 10:43 AM. If you have any questions, ask your nurse or doctor.          ALPRAZolam 1 MG tablet Commonly known as: XANAX Take 1 mg by mouth daily.   ARIPiprazole 30 MG tablet Commonly known as: ABILIFY Take 30 mg by mouth daily.   cabergoline 0.5 MG tablet Commonly known as: DOSTINEX Take half tablet twice a week   cetirizine 10 MG tablet Commonly known as: ZYRTEC Take 1 tablet (10 mg total) by mouth daily.   clindamycin 1 % external solution Commonly known as: Cleocin-T Apply topically 2 (two) times daily as needed.   desonide 0.05 % cream Commonly known as: DESOWEN Apply topically 2 (two) times daily.   FIBER PO Take by mouth.   FISH OIL PO Take by mouth.   fluconazole 150 MG tablet Commonly known as: DIFLUCAN Take 1 tablet now and then a second tablet in 72 hrs.   fluticasone 50 MCG/ACT nasal spray Commonly known as: FLONASE Place 2 sprays into both nostrils daily.   hydrocortisone 2.5 % cream Apply topically 2 (two) times daily.   MAGNESIUM PO Take by mouth.   multivitamin tablet Take 1 tablet by mouth daily.   naproxen sodium 550 MG tablet Commonly known as: Anaprox DS Take 1 tablet (550 mg total) by mouth 2 (two) times daily as needed.   nortriptyline 75 MG capsule Commonly known as: PAMELOR Take  1 capsule (75 mg total) by mouth at bedtime.   Olopatadine HCl 0.2 % Soln Apply 1 drop to eye daily.   omeprazole 40 MG capsule Commonly known as: PRILOSEC Take 1 capsule (40 mg total) by mouth daily.   Riboflavin-Magnesium-Feverfew 100-90-25 MG Tabs Take 100 mg by mouth daily.   Trulance 3 MG Tabs Generic drug: Plecanatide Take 1 tablet by mouth daily.   ZINC SULFATE-VITAMIN C MT Use as directed in the mouth or throat.   zolpidem 10 MG tablet Commonly known as: AMBIEN Take 10 mg by mouth at bedtime as needed.        Allergies:  Allergies  Allergen Reactions   Metronidazole Anaphylaxis    Past Medical History:  Diagnosis Date  Acid reflux    Colon polyp    H. pylori infection    History of seasonal allergies    Hypercholesterolemia    Migraines    Pituitary tumor    Thyroid nodule    Vaginal Pap smear, abnormal     Past Surgical History:  Procedure Laterality Date   CESAREAN SECTION     OTHER SURGICAL HISTORY     hemangio removed at child and had something added to head to help indentation    Family History  Problem Relation Age of Onset   Hypertension Mother    Hypercholesterolemia Mother    Colon polyps Maternal Grandmother    Colon cancer Neg Hx    Esophageal cancer Neg Hx    Stomach cancer Neg Hx    Rectal cancer Neg Hx     Social History:  reports that she has never smoked. She has never used smokeless tobacco. She reports current alcohol use. She reports that she does not use drugs.   Review of Systems  EXAM:  BP 124/82   Pulse 86   Ht 5\' 2"  (1.575 m)   Wt 169 lb 6.4 oz (76.8 kg)   LMP 10/03/2020   SpO2 99%   BMI 30.98 kg/m   Physical Exam   Left thyroid nodule is smooth, about 3 cm across Right lobe not palpable No lymphadenopathy  ASSESSMENT:     Prolactinoma since 2009 with history of sellar mass that was heterogenous and 7 mm in original size and presenting with headaches and galactorrhea.   Taking cabergoline  long-term now  With increasing her cabergoline she does not have any recent mastodynia even though her prolactin is high normal However she misunderstood the directions and has been taking 0.5 mg twice a week except for occasionally missing a dose  Last MRI was in 1/21  Multinodular goiter, with a longstanding benign left-sided nodule, diagnosed as benign follicular adenoma on 3 biopsies at intervals She also has had a slow growth of her thyroid nodule that was initially diagnosed in 2013  She may have a mildly autonomous thyroid nodule but TSH is now back to normal No local symptoms of pressure in the thyroid area Thyroid nodule appears to be slightly smaller compared to her last exam  Planned procedure of liposuction: She is clear for this procedure and from the endocrine standpoint and has no contraindication    PLAN:     She will try to take her cabergoline more regularly To alternate 1 tablet with half tablet and continue twice a week regimen  Follow-up in 4 months  Observation only for her thyroid nodule     Elayne Snare 10/27/20

## 2020-12-01 ENCOUNTER — Other Ambulatory Visit: Payer: Self-pay

## 2020-12-01 ENCOUNTER — Encounter: Payer: Self-pay | Admitting: Student

## 2020-12-01 ENCOUNTER — Ambulatory Visit (INDEPENDENT_AMBULATORY_CARE_PROVIDER_SITE_OTHER): Payer: PRIVATE HEALTH INSURANCE | Admitting: Student

## 2020-12-01 ENCOUNTER — Ambulatory Visit (HOSPITAL_COMMUNITY): Payer: PRIVATE HEALTH INSURANCE | Attending: Family Medicine

## 2020-12-01 VITALS — BP 100/70 | HR 115 | Ht 62.0 in | Wt 169.4 lb

## 2020-12-01 DIAGNOSIS — R188 Other ascites: Secondary | ICD-10-CM | POA: Diagnosis not present

## 2020-12-01 DIAGNOSIS — R079 Chest pain, unspecified: Secondary | ICD-10-CM | POA: Diagnosis present

## 2020-12-01 DIAGNOSIS — R0789 Other chest pain: Secondary | ICD-10-CM

## 2020-12-01 DIAGNOSIS — M79661 Pain in right lower leg: Secondary | ICD-10-CM | POA: Insufficient documentation

## 2020-12-01 HISTORY — DX: Pain in right lower leg: M79.661

## 2020-12-01 HISTORY — DX: Other chest pain: R07.89

## 2020-12-01 NOTE — Assessment & Plan Note (Signed)
Ultrasound of legs normal, no concern for DVT  - Ibuprofen as needed for pain  -Watch for improvement

## 2020-12-01 NOTE — Progress Notes (Signed)
    SUBJECTIVE:   CHIEF COMPLAINT / HPI:   Concern for ceroma Patient had liposuction on July 12 and was seen by her massage therapist who appreciated a fluid pocket in her abdomen. Patient has no systemic symptoms, fevers, or changes in bowel habits. But has had some belly pain since the procedure.  Leg pain: Patient has been having leg pain since the procedure. She describes the leg pain as a sharp heavy pressure/ tightness. She complains of continual shortness of breath and chest pain that's dull and achy. She also notes fatigue and weakness since her procedure. Leg pain is located on entire calf, right side. She walks 15 min every hour, is not coughing. She has been wearing compression socks since the procedure. Leg has not swelled.  PERTINENT  PMH / PSH: Chronic idiopathic constipation, seasonal allergies  OBJECTIVE:   Vitals:   12/01/20 1557  BP: 100/70  Pulse: (!) 115  SpO2: 99%   Pulse 84 on EKG  Physical Exam Cardiovascular:     Rate and Rhythm: Normal rate and regular rhythm.     Heart sounds: Normal heart sounds. No murmur heard.   No friction rub.  Pulmonary:     Effort: Pulmonary effort is normal. No respiratory distress.     Breath sounds: Normal breath sounds. No stridor. No wheezing, rhonchi or rales.  Chest:     Chest wall: Tenderness present.  Abdominal:     General: Bowel sounds are normal.     Palpations: Abdomen is soft. There is mass.  Musculoskeletal:     Right lower leg: Tenderness present. No swelling or deformity. No edema.     Left lower leg: No swelling, deformity or tenderness. No edema.   Ultrasound of right leg performed. All vessels compressible, no sign of clot Ultrasound of abdomen performed, observed 1/2 cm fluid collection in lower right quadrant  ASSESSMENT/PLAN:   Costochondral chest pain Pain reproducible under palpation, EKG normal with no change from last one. Pulse normal on EKG. Ultrasound of leg negative for DVT. Less  concerned for ACS, or PE, likely costochondritis  - Ibuprofen PRN for pain  - Consoled patient to go to ED if chest pain changes, worsens  - Watch for improvement  Pain of right lower leg Ultrasound of legs normal, no concern for DVT  - Ibuprofen as needed for pain  -Watch for improvement   Abdominal wall fluid collections Ultrasound demonstrated 1/2 cm fluid collection, patient s/p liposuction  -Watch for improvement  -Consoled patient to seek care if fluid collection gets bigger or new symptoms arise   Holley Bouche, MD [place]

## 2020-12-01 NOTE — Assessment & Plan Note (Signed)
Ultrasound demonstrated 1/2 cm fluid collection, patient s/p liposuction  -Watch for improvement  -Consoled patient to seek care if fluid collection gets bigger or new symptoms arise

## 2020-12-01 NOTE — Assessment & Plan Note (Signed)
Pain reproducible under palpation, EKG normal with no change from last one. Pulse normal on EKG. Ultrasound of leg negative for DVT. Less concerned for ACS, or PE, likely costochondritis  - Ibuprofen PRN for pain  - Consoled patient to go to ED if chest pain changes, worsens  - Watch for improvement

## 2020-12-01 NOTE — Patient Instructions (Signed)
It was great to see you! Thank you for allowing me to participate in your care!  Our plans for today:   - Chest pain  EKG shows heart normal Take ibuprofen as needed  Watch to see if improves  - Leg pain  Ultrasound showed legs were normal  Take ibuprofen as needed  Watch to see if improves  - Fluid in abdomen  Small will resolve on its own  Watch to see if improves in next few weeks    Take care and seek immediate care sooner if you develop any concerns.   Dr. Holley Bouche, MD [place]

## 2020-12-02 ENCOUNTER — Ambulatory Visit (HOSPITAL_COMMUNITY)
Admission: RE | Admit: 2020-12-02 | Discharge: 2020-12-02 | Disposition: A | Discharge status: Home / Self Care | Payer: PRIVATE HEALTH INSURANCE | Source: Ambulatory Visit | Attending: Family Medicine | Admitting: Family Medicine

## 2020-12-10 ENCOUNTER — Encounter: Payer: Self-pay | Admitting: Dermatology

## 2020-12-10 NOTE — Progress Notes (Signed)
   New Patient   Subjective  Kristin Daniels is a 45 y.o. female who presents for the following: Eczema (Breakouts on both wrists and sometimes face. OTC eczema cream. aveeno).  W history of waxing and waning skin eczema; multiple locations.  Desonide helpful. Location:  Duration:  Quality:  Associated Signs/Symptoms: Modifying Factors:  Severity:  Timing: Context:    The following portions of the chart were reviewed this encounter and updated as appropriate:  Tobacco  Allergies  Meds  Problems  Med Hx  Surg Hx  Fam Hx      Objective  Well appearing patient in no apparent distress; mood and affect are within normal limits. Left Lower Leg - Anterior, Left Wrist - Posterior, Right Wrist - Posterior Multiple 1 to 3 cm patches of Marginated chronic dermatitis, some postinflammatory hyperpigmentation.  Compatible with nummular eczema.  By history desonide controls the process.    A focused examination was performed including arms, legs, head, neck, back.. Relevant physical exam findings are noted in the Assessment and Plan.   Assessment & Plan  Nummular eczema Left Wrist - Posterior; Right Wrist - Posterior; Left Lower Leg - Anterior  Emphasized value of skin hydration.  Okay refills on her desonide cream but encouraged her to try treating areas that are doing well less than every day.  She can call me if the desonide becomes less effective; she certainly is a candidate to use a higher potency topical steroid.  Initial follow-up by MyChart or by phone 6 weeks.  Related Medications desonide (DESOWEN) 0.05 % cream Apply topically 2 (two) times daily.

## 2020-12-14 ENCOUNTER — Ambulatory Visit: Payer: PRIVATE HEALTH INSURANCE | Admitting: Neurology

## 2020-12-19 NOTE — Progress Notes (Deleted)
Virtual Visit via Video Note The purpose of this virtual visit is to provide medical care while limiting exposure to the novel coronavirus.    Consent was obtained for video visit:  Yes.   Answered questions that patient had about telehealth interaction:  Yes.   I discussed the limitations, risks, security and privacy concerns of performing an evaluation and management service by telemedicine. I also discussed with the patient that there may be a patient responsible charge related to this service. The patient expressed understanding and agreed to proceed.  Pt location: Home Physician Location: office Name of referring provider:  Daisy Floro, DO I connected with Kristin Daniels at patients initiation/request on 12/20/2020 at  1:10 PM EDT by video enabled telemedicine application and verified that I am speaking with the correct person using two identifiers. Pt MRN:  [id number] Pt DOB:  [birth year] Video Participants:  Kristin Daniels  Assessment and Plan:   Migraine without aura, without status migrainosus, not intractable Pituitary microadenoma  Migraine prevention:  *** Migraine rescue:  naproxen '550mg'$  Limit use of pain relievers to no more than 2 days out of week to prevent risk of rebound or medication-overuse headache. Keep headache diary Follow up ***   History of Present Illness:  Kristin Daniels is a 45 year old female with pituitary tumor (on cabergoline) and migraines who follows up for migraines.   UPDATE:   Increased nortriptyline in February. Intensity:  moderate Duration:  3 hours Frequency:  two to three times a week   Current NSAIDS:  naproxen '550mg'$  Current analgesics:  none Current triptans:  none Current ergotamine:  none Current anti-emetic:  none Current muscle relaxants:  none Current anti-anxiolytic:  none Current sleep aide:  none Current Antihypertensive medications:  none Current Antidepressant medications:  nortriptyline '75mg'$  at  bedtime Current Anticonvulsant medications:  none Current anti-CGRP:  none Current Vitamins/Herbal/Supplements:  Riboflavin-magnesium-feverfew 100-90-'25mg'$  Current Antihistamines/Decongestants:  Flonase Other therapy:  none Hormone/birth control:  None Other medications:  cabergoline   Caffeine:  No coffee.   Diet:  Ginger ale.  Drinks a lot of water. Exercise:  no Depression:  no; Anxiety:  no Other pain:  no Sleep hygiene:  Inconsistent.  6-7 hours but often wakes up throughout the night.   HISTORY:  She has had headaches since her early 29s, at which time she was diagnosed with a pituitary adenoma.  Headaches are described as moderate to severe non-throbbing pressure involving top and front of head or left temporal region.  There is associated nausea, photophobia, phonophobia and scalp tenderness.  No associated vomiting, visual disturbance or numbness or weakness.  No specific triggers.  Rest helps relieve it.  They typically have occurred about 3 times a month.  However, they have been near daily since January 2020, lasting several hours up to all day.  She does not know why.  She has been treating headaches with analgesics (Advil and Tylenol) about 4 days a week.     She takes cabergoline for her pituitary adenoma and is followed by endocrinology.  Thyroid and prolactin levels have been stable.  She had an eye exam which was unremarkable.    03/18/2016 MRI PITUITARY W WO:  posterior central left-sided pituitary adenoma, likely proteinaceous, 7 mm x 4 mm x 4.5 mm extending to the insertion of the infundibulum which is slightly deviated toward the right, and mild remodeling of the sella floor.  05/31/2019 MRI BRAIN & PITUITARY W WO: two nonspecific punctate hyperintense foci in the right  cerebral white matter (unremarkable) as well as 8 mmm pituitary lesion which may be a microadenoma vs Rathke's cleft cyst.   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  She underwent reconstructive surgery in 2010.  Past Medical History: Past Medical History:  Diagnosis Date   Acid reflux    Colon polyp    H. pylori infection    History of seasonal allergies    Hypercholesterolemia    Migraines    Pituitary tumor    Thyroid nodule    Vaginal Pap smear, abnormal     Medications: Outpatient Encounter Medications as of 12/20/2020  Medication Sig   ALPRAZolam (XANAX) 1 MG tablet Take 1 mg by mouth daily.   ARIPiprazole (ABILIFY) 30 MG tablet Take 30 mg by mouth daily.   cabergoline (DOSTINEX) 0.5 MG tablet Take 1 Tab on Sundays and half tablet on Thursdays   cetirizine (ZYRTEC) 10 MG tablet Take 1 tablet (10 mg total) by mouth daily.   clindamycin (CLEOCIN-T) 1 % external solution Apply topically 2 (two) times daily as needed.   desonide (DESOWEN) 0.05 % cream Apply topically 2 (two) times daily.   FIBER PO Take by mouth.   fluconazole (DIFLUCAN) 150 MG tablet Take 1 tablet now and then a second tablet in 72 hrs.   fluticasone (FLONASE) 50 MCG/ACT nasal spray Place 2 sprays into both nostrils daily.   hydrocortisone 2.5 % cream Apply topically 2 (two) times daily.   MAGNESIUM PO Take by mouth.   Multiple Vitamin (MULTIVITAMIN) tablet Take 1 tablet by mouth daily.   naproxen sodium (ANAPROX DS) 550 MG tablet Take 1 tablet (550 mg total) by mouth 2 (two) times daily as needed.   nortriptyline (PAMELOR) 75 MG capsule Take 1 capsule (75 mg total) by mouth at bedtime.    Olopatadine HCl 0.2 % SOLN Apply 1 drop to eye daily.   Omega-3 Fatty Acids (FISH OIL PO) Take by mouth.   omeprazole (PRILOSEC) 40 MG capsule Take 1 capsule (40 mg total) by mouth daily.   Plecanatide (TRULANCE) 3 MG TABS Take 1 tablet by mouth daily.   Riboflavin-Magnesium-Feverfew 100-90-25 MG TABS Take 100 mg by mouth daily.   ZINC SULFATE-VITAMIN C MT Use as directed in the mouth or throat.   zolpidem (AMBIEN) 10 MG tablet Take 10 mg by mouth at bedtime as needed.   No facility-administered encounter medications on file as of 12/20/2020.    Allergies: Allergies  Allergen Reactions   Metronidazole Anaphylaxis    Family History: Family History  Problem Relation Age of Onset   Hypertension Mother    Hypercholesterolemia Mother    Colon polyps Maternal Grandmother    Colon cancer Neg Hx    Esophageal cancer Neg Hx    Stomach cancer Neg Hx    Rectal cancer Neg Hx     Observations/Objective:   *** No acute distress.  Alert and oriented.  Speech fluent and not dysarthric.  Language intact.  Eyes orthophoric on primary gaze.  Face symmetric.  Follow Up Instructions:    -I discussed the assessment and treatment plan with the patient. The patient was provided an opportunity to ask questions and all were answered. The patient agreed with the plan and demonstrated an understanding of the instructions.   The patient was advised to call back or seek an in-person evaluation if the symptoms worsen or if the condition fails to improve as anticipated.   Dudley Major, DO

## 2020-12-20 ENCOUNTER — Telehealth: Payer: PRIVATE HEALTH INSURANCE | Admitting: Neurology

## 2020-12-20 NOTE — Progress Notes (Signed)
NEUROLOGY FOLLOW UP OFFICE NOTE  [name] [id number]  Assessment/Plan:   Migraine without aura, without status migrainosus, not intractable Pituitary microadenoma  Migraine prevention:  Increase nortriptyline to '100mg'$  at bedtime.  If no improvement in 2 months, we will change preventative. Migraine rescue:  I will have her try Nurtec with or without naproxen as needed. Limit use of pain relievers to no more than 2 days out of week to prevent risk of rebound or medication-overuse headache. Keep headache diary Follow up 6 months.  Subjective:  Kristin Daniels is a 45 year old female with pituitary tumor (on cabergoline) and migraines who follows up for migraines.   UPDATE:   Increased nortriptyline in February. Intensity:  moderate Duration:  2 hours to all day Frequency:  two to three times a week   Current NSAIDS:  naproxen '550mg'$  Current analgesics:  none Current triptans:  none Current ergotamine:  none Current anti-emetic:  none Current muscle relaxants:  none Current anti-anxiolytic:  none Current sleep aide:  none Current Antihypertensive medications:  none Current Antidepressant medications:  nortriptyline '75mg'$  at bedtime Current Anticonvulsant medications:  none Current anti-CGRP:  none Current Vitamins/Herbal/Supplements:  Riboflavin-magnesium-feverfew 100-90-'25mg'$  Current Antihistamines/Decongestants:  Flonase Other therapy:  none Hormone/birth control:  None Other medications:  cabergoline   Caffeine:  No coffee.   Diet:  Ginger ale.  Drinks a lot of water. Exercise:  no Depression:  no; Anxiety:  no Other pain:  no Sleep hygiene:  Inconsistent.  6-7 hours but often wakes up throughout the night.   HISTORY:  She has had headaches since her early 56s, at which time she was diagnosed with a pituitary adenoma.  Headaches are described as moderate to severe non-throbbing pressure involving top and front of head or left temporal region.  There is  associated nausea, photophobia, phonophobia and scalp tenderness.  No associated vomiting, visual disturbance or numbness or weakness.  No specific triggers.  Rest helps relieve it.  They typically have occurred about 3 times a month.  However, they have been near daily since January 2020, lasting several hours up to all day.  She does not know why.  She has been treating headaches with analgesics (Advil and Tylenol) about 4 days a week.     She takes cabergoline for her pituitary adenoma and is followed by endocrinology.  Thyroid and prolactin levels have been stable.  She had an eye exam which was unremarkable.    03/18/2016 MRI PITUITARY W WO:  posterior central left-sided pituitary adenoma, likely proteinaceous, 7 mm x 4 mm x 4.5 mm extending to the insertion of the infundibulum which is slightly deviated toward the right, and mild remodeling of the sella floor.  05/31/2019 MRI BRAIN & PITUITARY W WO: two nonspecific punctate hyperintense foci in the right cerebral white matter (unremarkable) as well as 8 mmm pituitary lesion which may be a microadenoma vs Rathke's cleft cyst.   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  She underwent reconstructive surgery in 2010.  PAST MEDICAL HISTORY:  Past Medical History:  Diagnosis Date   Acid reflux    Colon polyp    H. pylori infection    History of seasonal allergies    Hypercholesterolemia    Migraines    Pituitary tumor    Thyroid  nodule    Vaginal Pap smear, abnormal     MEDICATIONS: Current Outpatient Medications on File Prior to Visit  Medication Sig Dispense Refill   ALPRAZolam (XANAX) 1 MG tablet Take 1 mg by mouth daily.     ARIPiprazole (ABILIFY) 30 MG tablet Take 30 mg by mouth daily.     cabergoline (DOSTINEX) 0.5 MG tablet Take 1 Tab on Sundays and half tablet on Thursdays 18 tablet 1   cetirizine (ZYRTEC) 10 MG tablet Take 1 tablet (10 mg total) by mouth daily. 30 tablet 11   clindamycin (CLEOCIN-T) 1 % external solution Apply topically 2 (two) times daily as needed. 30 mL 2   desonide (DESOWEN) 0.05 % cream Apply topically 2 (two) times daily. 30 g 0   FIBER PO Take by mouth.     fluconazole (DIFLUCAN) 150 MG tablet Take 1 tablet now and then a second tablet in 72 hrs. 1 tablet 2   fluticasone (FLONASE) 50 MCG/ACT nasal spray Place 2 sprays into both nostrils daily. 16 g 6   hydrocortisone 2.5 % cream Apply topically 2 (two) times daily. 90 g 1   MAGNESIUM PO Take by mouth.     Multiple Vitamin (MULTIVITAMIN) tablet Take 1 tablet by mouth daily.     naproxen sodium (ANAPROX DS) 550 MG tablet Take 1 tablet (550 mg total) by mouth 2 (two) times daily as needed. 20 tablet 5   nortriptyline (PAMELOR) 75 MG capsule Take 1 capsule (75 mg total) by mouth at bedtime. 30 capsule 5   Olopatadine HCl 0.2 % SOLN Apply 1 drop to eye daily. 1 Bottle 0   Omega-3 Fatty Acids (FISH OIL PO) Take by mouth.     omeprazole (PRILOSEC) 40 MG capsule Take 1 capsule (40 mg total) by mouth daily. 30 capsule 0   Plecanatide (TRULANCE) 3 MG TABS Take 1 tablet by mouth daily. 30 tablet 11   Riboflavin-Magnesium-Feverfew 100-90-25 MG TABS Take 100 mg by mouth daily. 30 tablet 0   ZINC SULFATE-VITAMIN C MT Use as directed in the mouth or throat.     zolpidem (AMBIEN) 10 MG tablet Take 10 mg by mouth at bedtime as needed.     No current facility-administered medications on file prior to visit.    ALLERGIES: Allergies  Allergen  Reactions   Metronidazole Anaphylaxis    FAMILY HISTORY: Family History  Problem Relation Age of Onset   Hypertension Mother    Hypercholesterolemia Mother    Colon polyps Maternal Grandmother    Colon cancer Neg Hx    Esophageal cancer Neg Hx    Stomach cancer Neg Hx    Rectal cancer Neg Hx       Objective:  Blood pressure 110/81, pulse (!) 101, height '5\' 2"'$  (1.575 m), weight 170 lb 6.4 oz (77.3 kg), last menstrual period 11/24/2020, SpO2 99 %. General: No acute distress.  Patient appears well-groomed.     Metta Clines, DO

## 2020-12-21 ENCOUNTER — Encounter: Payer: Self-pay | Admitting: Neurology

## 2020-12-21 ENCOUNTER — Other Ambulatory Visit: Payer: Self-pay

## 2020-12-21 ENCOUNTER — Ambulatory Visit (INDEPENDENT_AMBULATORY_CARE_PROVIDER_SITE_OTHER): Payer: PRIVATE HEALTH INSURANCE | Admitting: Neurology

## 2020-12-21 VITALS — BP 110/81 | HR 101 | Ht 62.0 in | Wt 170.4 lb

## 2020-12-21 DIAGNOSIS — D352 Benign neoplasm of pituitary gland: Secondary | ICD-10-CM | POA: Diagnosis not present

## 2020-12-21 DIAGNOSIS — G43009 Migraine without aura, not intractable, without status migrainosus: Secondary | ICD-10-CM | POA: Diagnosis not present

## 2020-12-21 MED ORDER — NORTRIPTYLINE HCL 50 MG PO CAPS: 100.0000 mg | ORAL_CAPSULE | Freq: Every day | ORAL | 5 refills | Status: DC

## 2020-12-21 NOTE — Patient Instructions (Signed)
Increase nortriptyline to '100mg'$  at bedtime (two '50mg'$  pills at bedtime). If no improvement in headaches frequency in 2 months, contact me When you get a migraine, try Nurtec (1 tablet as needed - cannot repeat dose for 24 hours).  If not effective, then try taking it with naproxen.  IF effective, contact me for script MRI of pituitary gland with and without contrast in 6 months Follow up 6 months

## 2020-12-31 ENCOUNTER — Other Ambulatory Visit: Payer: Self-pay | Admitting: Family Medicine

## 2020-12-31 DIAGNOSIS — Z87898 Personal history of other specified conditions: Secondary | ICD-10-CM

## 2020-12-31 DIAGNOSIS — E221 Hyperprolactinemia: Secondary | ICD-10-CM

## 2021-01-04 ENCOUNTER — Other Ambulatory Visit: Payer: Self-pay | Admitting: Family Medicine

## 2021-01-04 DIAGNOSIS — Z1231 Encounter for screening mammogram for malignant neoplasm of breast: Secondary | ICD-10-CM

## 2021-02-05 ENCOUNTER — Other Ambulatory Visit: Payer: Self-pay | Admitting: Family Medicine

## 2021-02-05 DIAGNOSIS — E221 Hyperprolactinemia: Secondary | ICD-10-CM

## 2021-02-05 DIAGNOSIS — Z87898 Personal history of other specified conditions: Secondary | ICD-10-CM

## 2021-02-05 MED ORDER — CABERGOLINE 0.5 MG PO TABS: ORAL_TABLET | ORAL | 1 refills | Status: DC

## 2021-02-08 ENCOUNTER — Telehealth: Payer: Self-pay | Admitting: Endocrinology

## 2021-02-08 DIAGNOSIS — Z87898 Personal history of other specified conditions: Secondary | ICD-10-CM

## 2021-02-08 DIAGNOSIS — E221 Hyperprolactinemia: Secondary | ICD-10-CM

## 2021-02-08 MED ORDER — CABERGOLINE 0.5 MG PO TABS
ORAL_TABLET | ORAL | 1 refills | Status: DC
Start: 2021-02-08 — End: 2021-07-13

## 2021-02-08 NOTE — Telephone Encounter (Signed)
Medication was sent on 10/03 but sent as no print instead of normal. RX [facility]) has now been resent to [facility] [address]. Will call patient to notify.

## 2021-02-08 NOTE — Telephone Encounter (Signed)
Pt called to refill cabergoline (DOSTINEX) 0.5 MG tablet to [facility] [place], . Pt says medication did not reach pharmacy. Pt contact [phone]

## 2021-02-08 NOTE — Telephone Encounter (Signed)
Pt was notified today that Rx was sent. Documented under a different encounter.

## 2021-02-08 NOTE — Telephone Encounter (Signed)
Patient notified of RX refill

## 2021-02-21 ENCOUNTER — Other Ambulatory Visit: Payer: Self-pay

## 2021-02-21 ENCOUNTER — Ambulatory Visit
Admission: RE | Admit: 2021-02-21 | Discharge: 2021-02-21 | Disposition: A | Discharge status: Home / Self Care | Payer: PRIVATE HEALTH INSURANCE | Source: Ambulatory Visit | Attending: *Deleted | Admitting: *Deleted

## 2021-02-21 DIAGNOSIS — Z1231 Encounter for screening mammogram for malignant neoplasm of breast: Secondary | ICD-10-CM

## 2021-02-22 ENCOUNTER — Other Ambulatory Visit (INDEPENDENT_AMBULATORY_CARE_PROVIDER_SITE_OTHER): Payer: PRIVATE HEALTH INSURANCE

## 2021-02-22 DIAGNOSIS — E042 Nontoxic multinodular goiter: Secondary | ICD-10-CM | POA: Diagnosis not present

## 2021-02-22 DIAGNOSIS — E221 Hyperprolactinemia: Secondary | ICD-10-CM | POA: Diagnosis not present

## 2021-02-22 LAB — TSH: TSH: 0.79 u[IU]/mL (ref 0.35–5.50)

## 2021-02-22 LAB — T4, FREE: Free T4: 0.68 ng/dL (ref 0.60–1.60)

## 2021-02-23 LAB — PROLACTIN: Prolactin: 9.1 ng/mL (ref 4.8–23.3)

## 2021-02-27 ENCOUNTER — Ambulatory Visit (INDEPENDENT_AMBULATORY_CARE_PROVIDER_SITE_OTHER): Payer: PRIVATE HEALTH INSURANCE | Admitting: Endocrinology

## 2021-02-27 ENCOUNTER — Other Ambulatory Visit: Payer: Self-pay

## 2021-02-27 ENCOUNTER — Encounter: Payer: Self-pay | Admitting: Endocrinology

## 2021-02-27 ENCOUNTER — Other Ambulatory Visit: Payer: PRIVATE HEALTH INSURANCE

## 2021-02-27 VITALS — BP 106/78 | HR 92 | Wt 170.2 lb

## 2021-02-27 DIAGNOSIS — E221 Hyperprolactinemia: Secondary | ICD-10-CM

## 2021-02-27 DIAGNOSIS — E042 Nontoxic multinodular goiter: Secondary | ICD-10-CM | POA: Diagnosis not present

## 2021-02-27 NOTE — Progress Notes (Signed)
Patient ID: Kristin Daniels, female   DOB: 1975-11-05, 45 y.o.   MRN: [id number]            Chief complaint: Endocrinology follow-up  History of Present Illness    PROBLEM 1:  Prolactinoma In 2009 she had presented to her physician with complaints of milky discharge from her breasts and headaches She had not had any late or missed menstrual cycles at that time She reportedly was diagnosed to have a prolactinoma and started on cabergoline She does not know what her baseline prolactin level was  She had been followed by an endocrinologist in [place] state with periodic lab work and MRIs and continued on cabergoline Only a few reports are available from previous records and not clear if her prolactin has been consistently controlled In 12/16 her prolactin was 45 About 6 months prior to her visit in December 2018 with her PCP she was told to start taking 1/2 tablet only once a week with her Dostinex  RECENT history: In late 2018 started having some headaches and also breast fullness and mild galactorrhea When her prolactin level was 29 she was told to increase her dosage to half tablet twice a week With relatively higher prolactin levels she is usually has symptoms of her breasts swelling, feeling full and tender.  Also may have a milky discharge on expression but no change in menstrual cycles.  On her last visit in 6/22 for a couple of weeks she had missed an occasional dose of her cabergoline and prolactin was high normal at 22.7 which she was feeling fine With the current dose of 1 tablet alternating with half tablet her symptoms of mastodynia have resolved and she has no galactorrhea also  Prolactin is now very normal  No headaches  Prolactin levels:  Lab Results  Component Value Date   PROLACTIN 9.1 02/22/2021   PROLACTIN 22.7 10/23/2020   PROLACTIN 17.7 06/21/2020   PROLACTIN 36.2 (H) 03/13/2020   MRI of pituitary gland was done by neurologist in the 05/2019 which showed  an 8 mm hyperintense mass in the posterior sella.  No comparison was made with prior MRI as below  MRI of pituitary gland as of 03/15/2016 shows a 7 mm posterior central left-sided lobulated pituitary adenoma extending to the insertion of the infundibulum which is slightly deviated towards the right  Past history:  During her pregnancy when she was not on regular medication she developed severe diabetes insipidus  which was controlled with her restarting treatment and she took initially bromocriptine and subsequently cabergoline during her pregnancy   PROBLEM 2:  Multinodular goiter.  She had this diagnosed on routine exam several years ago  No prior ultrasound reports are available when [place] out of [place] and no description of her exam is available from previous notes.  However baseline size of the nodule on the left was 2.8 cm in 2013 Records have shown that she had needle aspiration biopsies of the left dominant nodule 3 times, see below.  Biopsies have been benign consistently including Afirma testing  Thyroid nodule measured 4.4 cm in 04/2020  No symptoms of local pressure sensation, difficulty swallowing or choking She again says she thinks that thyroid nodule on the left is getting larger She has no unusual fatigue  Previously in 11/21 her left-sided nodule was about 3-3.5 cm on exam  Her thyroid levels have been variable with occasionally slightly low TSH levels without increase in free T4 or T3 levels TSH is  consistently normal although free T4 may be at times low normal  Lab Results  Component Value Date   TSH 0.79 02/22/2021   TSH 0.529 10/23/2020   TSH 0.28 (L) 03/13/2020   FREET4 0.68 02/22/2021   FREET4 0.85 10/23/2020   FREET4 0.62 03/13/2020   Lab Results  Component Value Date   T3FREE 2.5 10/23/2020   T3FREE 3.0 03/13/2020   T3FREE 3.6 09/08/2018     Previous studies:   Her first biopsy was done in 09/2011 which had shown indeterminate  cytology and this was confirmed to be benign on Affirma testing At that time her nodule was 2.8 cm  Needle aspiration done in 01/2015 indicated she had a 3 cm nodule that previously had been benign; this showed scanty cellular specimen along with colloid and degenerated macrophages In 08/2016 the needle aspiration biopsy showed a benign follicular adenoma and the nodule size was indicated at 3.7 cm  Lab Results  Component Value Date   TSH 0.79 02/22/2021   TSH 0.529 10/23/2020   TSH 0.28 (L) 03/13/2020   FREET4 0.68 02/22/2021   FREET4 0.85 10/23/2020   FREET4 0.62 03/13/2020     Allergies as of 02/27/2021       Reactions   Metronidazole Anaphylaxis        Medication List        Accurate as of February 27, 2021 11:58 AM. If you have any questions, ask your nurse or doctor.          ALPRAZolam 1 MG tablet Commonly known as: XANAX Take 1 mg by mouth daily.   ARIPiprazole 30 MG tablet Commonly known as: ABILIFY Take 30 mg by mouth daily.   cabergoline 0.5 MG tablet Commonly known as: DOSTINEX Take 1 Tab on Sundays and half tablet on Thursdays   cetirizine 10 MG tablet Commonly known as: ZYRTEC Take 1 tablet (10 mg total) by mouth daily.   clindamycin 1 % external solution Commonly known as: Cleocin-T Apply topically 2 (two) times daily as needed.   desonide 0.05 % cream Commonly known as: DESOWEN Apply topically 2 (two) times daily.   FIBER PO Take by mouth.   FISH OIL PO Take by mouth.   fluconazole 150 MG tablet Commonly known as: DIFLUCAN Take 1 tablet now and then a second tablet in 72 hrs.   fluticasone 50 MCG/ACT nasal spray Commonly known as: FLONASE Place 2 sprays into both nostrils daily.   hydrocortisone 2.5 % cream Apply topically 2 (two) times daily.   MAGNESIUM PO Take by mouth.   multivitamin tablet Take 1 tablet by mouth daily.   naproxen sodium 550 MG tablet Commonly known as: Anaprox DS Take 1 tablet (550 mg total) by  mouth 2 (two) times daily as needed.   nortriptyline 50 MG capsule Commonly known as: PAMELOR Take 2 capsules (100 mg total) by mouth at bedtime.   Olopatadine HCl 0.2 % Soln Apply 1 drop to eye daily.   omeprazole 40 MG capsule Commonly known as: PRILOSEC Take 1 capsule (40 mg total) by mouth daily.   Riboflavin-Magnesium-Feverfew 100-90-25 MG Tabs Take 100 mg by mouth daily.   Trulance 3 MG Tabs Generic drug: Plecanatide Take 1 tablet by mouth daily.   ZINC SULFATE-VITAMIN C MT Use as directed in the mouth or throat.   zolpidem 10 MG tablet Commonly known as: AMBIEN Take 10 mg by mouth at bedtime as needed.        Allergies:  Allergies  Allergen Reactions  Metronidazole Anaphylaxis    Past Medical History:  Diagnosis Date   Acid reflux    Colon polyp    H. pylori infection    History of seasonal allergies    Hypercholesterolemia    Migraines    Pituitary tumor    Thyroid nodule    Vaginal Pap smear, abnormal     Past Surgical History:  Procedure Laterality Date   CESAREAN SECTION     OTHER SURGICAL HISTORY     hemangio removed at child and had something added to head to help indentation    Family History  Problem Relation Age of Onset   Hypertension Mother    Hypercholesterolemia Mother    Colon polyps Maternal Grandmother    Colon cancer Neg Hx    Esophageal cancer Neg Hx    Stomach cancer Neg Hx    Rectal cancer Neg Hx     Social History:  reports that she has never smoked. She has never used smokeless tobacco. She reports current alcohol use. She reports that she does not use drugs.   Review of Systems  EXAM:  BP 106/78   Pulse 92   Wt 170 lb 3.2 oz (77.2 kg)   SpO2 99%   BMI 31.13 kg/m   Physical Exam   Left thyroid nodule is about 2.5 cm in size and relatively smooth, firm, rest of the left lobe is not palpable.  The nodule is relatively lateral but not extending beyond the sternomastoid on the left Right lobe not  palpable   ASSESSMENT:     Prolactinoma since 2009 with history of sellar mass that was heterogenous and 7 mm in original size and presenting with headaches and galactorrhea.   Taking cabergoline long-term with the current dose of 0.5 mg alternating with half tablet in each week  With taking her cabergoline regularly she has not had any subjective symptoms of galactorrhea or mastodynia and her prolactin is excellent at about 9  Last MRI was in 1/21 showing a small adenoma  Multinodular goiter, with a longstanding benign left-sided nodule, diagnosed as benign follicular adenoma on 3 biopsies at intervals She also has had a slow growth of her thyroid nodule that was initially diagnosed in 2013 However subsequently nodule on exam is relatively smaller  Reassured her that since she has had several benign biopsies she does not need further evaluation especially has not had any change in the size of her nodule on her exams     PLAN:     Continue same dose of cabergoline, alternating 1 tablet with half tablet, twice in each week  Follow-up in 6 months  Observation only needed for her left thyroid nodule     Elayne Snare 02/27/21

## 2021-03-02 ENCOUNTER — Ambulatory Visit: Payer: PRIVATE HEALTH INSURANCE | Admitting: Endocrinology

## 2021-04-05 ENCOUNTER — Ambulatory Visit (INDEPENDENT_AMBULATORY_CARE_PROVIDER_SITE_OTHER): Payer: PRIVATE HEALTH INSURANCE | Admitting: Family Medicine

## 2021-04-05 ENCOUNTER — Other Ambulatory Visit: Payer: Self-pay

## 2021-04-05 ENCOUNTER — Encounter: Payer: Self-pay | Admitting: Family Medicine

## 2021-04-05 VITALS — BP 120/82 | HR 85 | Ht 62.0 in | Wt 171.0 lb

## 2021-04-05 DIAGNOSIS — M5416 Radiculopathy, lumbar region: Secondary | ICD-10-CM

## 2021-04-05 DIAGNOSIS — Z01818 Encounter for other preprocedural examination: Secondary | ICD-10-CM

## 2021-04-05 MED ORDER — BACLOFEN 10 MG PO TABS: 10.0000 mg | ORAL_TABLET | Freq: Three times a day (TID) | ORAL | 0 refills | Status: DC

## 2021-04-05 NOTE — Progress Notes (Signed)
   SUBJECTIVE:   CHIEF COMPLAINT / HPI:   Chief Complaint  Patient presents with   Heaviness/Weakness in legs     Vandella Ord is a 45 y.o. female here for lower back pain that started October 24th. Had a BBL in June. The pain occurred when she bent down to pick up the clothes basket. Feelsl ike a pulling sensation in her back. Pain radiated into her right buttocks and RLE. States it feels heavy and achy. She wears her BBL compression wrap. No swelling in her legs, chest pain or shortness of breath.    PERTINENT  PMH / PSH: reviewed and updated as appropriate   OBJECTIVE:   BP 120/82   Pulse 85   Ht 5\' 2"  (1.575 m)   Wt 171 lb (77.6 kg)   LMP 03/13/2021   SpO2 100%   BMI 31.28 kg/m    GEN:  well appearing female, in no acute distress  CV: regular rate and rhythm RESP: no increased work of breathing, clear to ascultation bilaterally MSK: no edema, or calf tenderness Lumbar spine: - Inspection: no gross deformity or asymmetry, swelling or ecchymosis, wearing compression body suit with lumbar foam - Palpation: No TTP over the spinous processes, paraspinal muscles, or SI joints b/l - ROM: full active ROM of the lumbar spine in flexion and extension without pain - Strength: 5/5 strength of lower extremity in L4-S1 nerve root distributions b/l - Neuro: sensation intact in the L4-S1 nerve root distribution b/l, 2+ L4 and S1 reflexes - Special testing: Negative straight leg raise   ASSESSMENT/PLAN:   Lumbar radiculopathy, acute Pt is a 45 yo female with acute lumbar radiculopathy. Discussed with patient gradually returning to normal activities, as tolerated. Pt to continue ordinary activities within the limits permitted by pain. Continue OTC anaglesic. Advised to take Tylenol and ibuprofen together. Trial baclofen. Counseled patient on red flag symptoms and when to seek immediate care.  No red flags suggesting cauda equina syndrome or progressive major motor weakness. Patient to  return if symptoms do not improve with conservative treatment.  ED precautions given. Consider imaging at that time.      Pt to schedule follow up   Lyndee Hensen, DO PGY-3, [facility] 04/05/2021

## 2021-04-05 NOTE — Patient Instructions (Signed)
For your back pain, Take 500 mg Tylenol with  Ibuprofen/Advil up to three times a day as needed for pain. Take your first muscle relaxer 2 hours before bed to see how you feel.    Watch for worsening symptoms such as an increasing weakness or loss of sensation legs, increasing pain and the loss of bladder or bowel function. Should any of these occur, go to the emergency department immediately.  If your pain doesn't go away in 6 weeks, let me know.   Schedule your physical in the 2-4 months so we can discuss your next steps and obtain additional lab work.    Take Care,  Dr. Susa Simmonds

## 2021-04-10 DIAGNOSIS — M5416 Radiculopathy, lumbar region: Secondary | ICD-10-CM

## 2021-04-10 HISTORY — DX: Radiculopathy, lumbar region: M54.16

## 2021-04-10 NOTE — Assessment & Plan Note (Signed)
Pt is a 45 yo female with acute lumbar radiculopathy. Discussed with patient gradually returning to normal activities, as tolerated. Pt to continue ordinary activities within the limits permitted by pain. Continue OTC anaglesic. Advised to take Tylenol and ibuprofen together. Trial baclofen. Counseled patient on red flag symptoms and when to seek immediate care.  No red flags suggesting cauda equina syndrome or progressive major motor weakness. Patient to return if symptoms do not improve with conservative treatment.  ED precautions given. Consider imaging at that time.

## 2021-04-14 ENCOUNTER — Encounter: Payer: Self-pay | Admitting: Family Medicine

## 2021-04-16 ENCOUNTER — Other Ambulatory Visit: Payer: Self-pay | Admitting: Family Medicine

## 2021-04-16 MED ORDER — CYCLOBENZAPRINE HCL 5 MG PO TABS: 5.0000 mg | ORAL_TABLET | Freq: Three times a day (TID) | ORAL | 0 refills | Status: DC | PRN

## 2021-04-17 ENCOUNTER — Encounter: Payer: Self-pay | Admitting: Family Medicine

## 2021-04-19 NOTE — Progress Notes (Deleted)
° ° °  SUBJECTIVE:   CHIEF COMPLAINT / HPI:   Low back and***hip pain: Seen on 04/05/2021 with acute lumbar radiculopathy.  At that time it was discussed the patient gradually return to normal activities as tolerated.  He was recommended to continue OTC analgesics including ibuprofen and Tylenol.  Trial of baclofen was given. Per chart review back pain started October 24.  It occurred after she bent down to pick up clothing basket and she felt a pulling sensation in her back with pain radiating down her right lower extremity.  PERTINENT  PMH / PSH: ***  OBJECTIVE:   There were no vitals taken for this visit. ***  General: NAD, pleasant, able to participate in exam Respiratory: No respiratory distress MSK: *** Psych: Normal affect and mood  ASSESSMENT/PLAN:   No problem-specific Assessment & Plan notes found for this encounter.      Lurline Del, [place]

## 2021-04-23 ENCOUNTER — Other Ambulatory Visit: Payer: Self-pay

## 2021-04-23 ENCOUNTER — Ambulatory Visit: Payer: PRIVATE HEALTH INSURANCE | Admitting: Family Medicine

## 2021-04-23 ENCOUNTER — Ambulatory Visit (INDEPENDENT_AMBULATORY_CARE_PROVIDER_SITE_OTHER): Payer: PRIVATE HEALTH INSURANCE | Admitting: Family Medicine

## 2021-04-23 ENCOUNTER — Emergency Department (HOSPITAL_COMMUNITY): Payer: PRIVATE HEALTH INSURANCE

## 2021-04-23 ENCOUNTER — Encounter (HOSPITAL_COMMUNITY): Payer: Self-pay | Admitting: *Deleted

## 2021-04-23 ENCOUNTER — Emergency Department (HOSPITAL_COMMUNITY)
Admission: EM | Admit: 2021-04-23 | Discharge: 2021-04-23 | Disposition: A | Discharge status: Home / Self Care | Payer: PRIVATE HEALTH INSURANCE | Attending: Emergency Medicine | Admitting: Emergency Medicine

## 2021-04-23 VITALS — BP 124/86 | HR 99 | Ht 62.0 in | Wt 170.4 lb

## 2021-04-23 DIAGNOSIS — D72829 Elevated white blood cell count, unspecified: Secondary | ICD-10-CM | POA: Insufficient documentation

## 2021-04-23 DIAGNOSIS — N898 Other specified noninflammatory disorders of vagina: Secondary | ICD-10-CM

## 2021-04-23 DIAGNOSIS — M5136 Other intervertebral disc degeneration, lumbar region: Secondary | ICD-10-CM

## 2021-04-23 DIAGNOSIS — M79604 Pain in right leg: Secondary | ICD-10-CM | POA: Insufficient documentation

## 2021-04-23 DIAGNOSIS — M5106 Intervertebral disc disorders with myelopathy, lumbar region: Secondary | ICD-10-CM | POA: Diagnosis not present

## 2021-04-23 DIAGNOSIS — M545 Low back pain, unspecified: Secondary | ICD-10-CM | POA: Diagnosis present

## 2021-04-23 DIAGNOSIS — R531 Weakness: Secondary | ICD-10-CM | POA: Insufficient documentation

## 2021-04-23 DIAGNOSIS — R209 Unspecified disturbances of skin sensation: Secondary | ICD-10-CM | POA: Insufficient documentation

## 2021-04-23 LAB — POCT WET PREP (WET MOUNT)
Clue Cells Wet Prep Whiff POC: NEGATIVE
Trichomonas Wet Prep HPF POC: ABSENT

## 2021-04-23 LAB — URINALYSIS, MICROSCOPIC (REFLEX)

## 2021-04-23 LAB — URINALYSIS, ROUTINE W REFLEX MICROSCOPIC
Bilirubin Urine: NEGATIVE
Glucose, UA: NEGATIVE mg/dL
Ketones, ur: NEGATIVE mg/dL
Nitrite: NEGATIVE
Protein, ur: NEGATIVE mg/dL
Specific Gravity, Urine: 1.015 (ref 1.005–1.030)
pH: 6 (ref 5.0–8.0)

## 2021-04-23 LAB — PREGNANCY, URINE: Preg Test, Ur: NEGATIVE

## 2021-04-23 MED ORDER — GABAPENTIN 100 MG PO CAPS: 200.0000 mg | ORAL_CAPSULE | Freq: Three times a day (TID) | ORAL | 0 refills | Status: DC

## 2021-04-23 NOTE — ED Notes (Signed)
Pt c/o numbness and tingling from right lower back down to right leg. Pt states her right lower back feels stiff and gets muscle spasms. Pt states the right hip "locks" whenever she stands or bends down. Pt has 2+ right pedal pulse, foot warm to touch, cap refill less than 3 sec, pt able to move foot.

## 2021-04-23 NOTE — Patient Instructions (Signed)
We performed a wet prep which did not show any yeast, no BV, no trichomonas.  This is consistent with your symptoms of likely having a vaginal yeast infection and treating it with Diflucan.  I have updated your allergies and recommend avoiding Diflucan or medications like it due to the allergic reaction you likely had.  In the future providers can consider another antifungal medication called terbinafine if you have any future bouts of vaginal yeast infections.

## 2021-04-23 NOTE — ED Provider Notes (Signed)
Emergency Medicine Provider Triage Evaluation Note  Kristin Daniels , a 45 y.o. female  was evaluated in triage.  Pt complains of low back pain.  Ongoing issues since November but worsening.  Has seen PCP and did trial of muscle relaxant without change.  States she has urinated on herself twice and tonight fell into floor because right leg went completely numb.  She is ambulatory in triage.  Review of Systems  Positive: Back pain, incontinence Negative: fever  Physical Exam  BP (!) 128/94    Pulse 93    Temp 99.3 F (37.4 C) (Oral)    Resp 16    SpO2 100%  Gen:   Awake, no distress   Resp:  Normal effort  MSK:   Moves extremities without difficulty  Other:  DP pulses intact BLE, able to plantar and dorsiflex without issue, ambulatory in triage with steady gait  Medical Decision Making  Medically screening exam initiated at 2:21 AM.  Appropriate orders placed.  Soo Matchett was informed that the remainder of the evaluation will be completed by another provider, this initial triage assessment does not replace that evaluation, and the importance of remaining in the ED until their evaluation is complete.  Back pain.  Ongoing since November, worsening.  Has had incontinence x2 and fall tonight due to right leg numbness.  She has no prior imaging.  Will check UA and MRI lumbar spine.   Larene Pickett, PA-C 04/23/21 1749    Orpah Greek, MD 04/23/21 [phone]

## 2021-04-23 NOTE — ED Provider Notes (Signed)
[facility] EMERGENCY DEPARTMENT Provider Note   CSN: [id number] Arrival date & time: 04/23/21  0133     History Chief Complaint  Patient presents with   Back Pain    Senai Ramnath is a 45 y.o. female.   Back Pain Associated symptoms: numbness and weakness    45 year old female presenting to the emergency department with a chief complaint of right leg pain/back pain and numbness and weakness with associated muscle spasm.  The patient states that she has had the pain for several weeks but it has been worsening over the past few days.  She has had some difficulty with ambulation.  She saw her PCP did a trial with muscle accidents and had no significant improvement.  She did urinate on herself twice earlier today as mentioned in triage but has had no further episodes of urinary incontinence.  She was ambulatory in triage with some pain.  Pain is described as sharp, severe, radiating down her right leg, worse with movement and bending, better with rest.  Endorses worsening numbness in her right leg.  Past Medical History:  Diagnosis Date   Acid reflux    Colon polyp    H. pylori infection    History of seasonal allergies    Hypercholesterolemia    Migraines    Pituitary tumor    Thyroid nodule    Vaginal Pap smear, abnormal     Patient Active Problem List   Diagnosis Date Noted   Lumbar radiculopathy, acute 04/10/2021   Costochondral chest pain 12/01/2020   Pain of right lower leg 12/01/2020   Abdominal wall fluid collections 12/01/2020   Preoperative evaluation to rule out surgical contraindication 10/23/2020   Seasonal allergic rhinitis due to pollen 08/28/2020   Acne 07/26/2020   Physically well but worried 07/26/2020   H. 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[place]) 03/14/2017   Uterine leiomyoma 03/14/2017    Past Surgical History:  Procedure Laterality Date   CESAREAN SECTION     OTHER SURGICAL HISTORY     hemangio removed at child and had something added to head to help indentation     OB History     Gravida  2   Para  1   Term      Preterm  1   AB  1   Living  1      SAB  1   IAB      Ectopic      Multiple      Live Births  1           Family History  Problem Relation Age of Onset   Hypertension Mother    Hypercholesterolemia Mother    Colon polyps Maternal Grandmother    Colon cancer Neg Hx    Esophageal cancer Neg Hx    Stomach cancer Neg Hx    Rectal cancer Neg Hx     Social History   Tobacco Use   Smoking status: Never   Smokeless tobacco: Never  Vaping Use   Vaping Use: Never used  Substance Use Topics   Alcohol use: Yes    Comment: holidays   Drug use: No  Home Medications Prior to Admission medications   Medication Sig Start Date End Date Taking? Authorizing Provider  cyclobenzaprine (FLEXERIL) 5 MG tablet Take 1 tablet (5 mg total) by mouth 3 (three) times daily as needed for muscle spasms. 04/16/21   Brimage, Ronnette Juniper, DO  gabapentin (NEURONTIN) 100 MG capsule Take 2 capsules (200 mg total) by mouth 3 (three) times daily. 04/23/21 05/23/21 Yes Regan Lemming, MD  ALPRAZolam Duanne Moron) 1 MG tablet Take 1 mg by mouth daily. 06/28/20   [provider]  ARIPiprazole (ABILIFY) 30 MG tablet Take 30 mg by mouth daily. 07/19/20   [provider]  cabergoline (DOSTINEX) 0.5 MG tablet Take 1 Tab on Sundays and half tablet on Thursdays 02/08/21   Elayne Snare, MD  cetirizine (ZYRTEC) 10 MG tablet Take 1 tablet (10 mg total) by mouth daily. 08/28/20   Meccariello, Bernita Raisin, DO  clindamycin (CLEOCIN-T) 1 % external solution Apply topically 2 (two) times daily as needed.  10/23/20   Daisy Floro, DO  desonide (DESOWEN) 0.05 % cream Apply topically 2 (two) times daily. 05/29/20   Lavonna Monarch, MD  FIBER PO Take by mouth.    [provider]  fluticasone (FLONASE) 50 MCG/ACT nasal spray Place 2 sprays into both nostrils daily. 08/28/20   Meccariello, Bernita Raisin, DO  hydrocortisone 2.5 % cream Apply topically 2 (two) times daily. 06/02/20   Lavonna Monarch, MD  MAGNESIUM PO Take by mouth.    [provider]  Multiple Vitamin (MULTIVITAMIN) tablet Take 1 tablet by mouth daily.    [provider]  naproxen sodium (ANAPROX DS) 550 MG tablet Take 1 tablet (550 mg total) by mouth 2 (two) times daily as needed. 01/11/20   Pieter Partridge, DO  nortriptyline (PAMELOR) 50 MG capsule Take 2 capsules (100 mg total) by mouth at bedtime. 12/21/20   Pieter Partridge, DO  Olopatadine HCl 0.2 % SOLN Apply 1 drop to eye daily. 09/21/18   Myles Gip, DO  Omega-3 Fatty Acids (FISH OIL PO) Take by mouth.    [provider]  omeprazole (PRILOSEC) 40 MG capsule Take 1 capsule (40 mg total) by mouth daily. 03/25/19   Kathrene Alu, MD  Plecanatide (TRULANCE) 3 MG TABS Take 1 tablet by mouth daily. 05/12/19   Ladene Artist, MD  Riboflavin-Magnesium-Feverfew 100-90-25 MG TABS Take 100 mg by mouth daily. 03/25/19   Kathrene Alu, MD  ZINC SULFATE-VITAMIN C MT Use as directed in the mouth or throat.    [provider]  zolpidem (AMBIEN) 10 MG tablet Take 10 mg by mouth at bedtime as needed. 07/19/20   [provider]    Allergies    Metronidazole and Diflucan [fluconazole]  Review of Systems   Review of Systems  Musculoskeletal:  Positive for back pain.  Neurological:  Positive for weakness and numbness.  All other systems reviewed and are negative.  Physical Exam Updated Vital Signs BP 115/84    Pulse 100    Temp 99.6 F (37.6 C) (Oral)    Resp 18    Ht 5\' 2"  (1.575 m)    Wt 77.6 kg    LMP 04/08/2021    SpO2 99%    BMI  31.29 kg/m   Physical Exam Vitals and nursing note reviewed.  Constitutional:      General: She is not in acute distress.    Appearance: She is well-developed.  HENT:     Head: Normocephalic and atraumatic.  Eyes:  Conjunctiva/sclera: Conjunctivae normal.  Cardiovascular:     Rate and Rhythm: Normal rate and regular rhythm.     Heart sounds: No murmur heard. Pulmonary:     Effort: Pulmonary effort is normal. No respiratory distress.     Breath sounds: Normal breath sounds.  Abdominal:     Palpations: Abdomen is soft.     Tenderness: There is no abdominal tenderness.  Musculoskeletal:        General: No swelling.     Cervical back: Neck supple.  Skin:    General: Skin is warm and dry.     Capillary Refill: Capillary refill takes less than 2 seconds.  Neurological:     Mental Status: She is alert.     Cranial Nerves: No cranial nerve deficit.     Sensory: No sensory deficit.     Motor: No weakness.     Gait: Gait normal.     Comments: 5 out of 5 strength in the lower extremities bilaterally  Psychiatric:        Mood and Affect: Mood normal.    ED Results / Procedures / Treatments   Labs (all labs ordered are listed, but only abnormal results are displayed) Labs Reviewed  URINALYSIS, ROUTINE W REFLEX MICROSCOPIC - Abnormal; Notable for the following components:      Result Value   Hgb urine dipstick TRACE (*)    Leukocytes,Ua MODERATE (*)    All other components within normal limits  URINALYSIS, MICROSCOPIC (REFLEX) - Abnormal; Notable for the following components:   Bacteria, UA RARE (*)    All other components within normal limits  PREGNANCY, URINE    EKG None  Radiology MR LUMBAR SPINE WO CONTRAST  Result Date: 04/23/2021 CLINICAL DATA:  Back trauma with no prior imaging. Incontinence and right leg numbness. EXAM: MRI LUMBAR SPINE WITHOUT CONTRAST TECHNIQUE: Multiplanar, multisequence MR imaging of the lumbar spine was performed. No intravenous contrast  was administered. COMPARISON:  None. FINDINGS: Segmentation: 5 lumbar type vertebrae based on the available coverage Alignment:  Physiologic. Vertebrae:  No fracture, evidence of discitis, or bone lesion. Conus medullaris and cauda equina: Conus extends to the L1-2 level. Conus and cauda equina appear normal. Paraspinal and other soft tissues: Posterior intramural uterine fibroid measuring 2.5 cm. 4 cm right ovarian cyst with simple MR appearance. No follow-up imaging recommended. Note: This recommendation does not apply to premenarchal patients and to those with increased risk (genetic, family history, elevated tumor markers or other high-risk factors) of ovarian cancer. Reference: JACR 2020 Feb; 17(2):248-254 Disc levels: L4-L5: Minor facet spurring L5-S1:Disc narrowing and desiccation with bulge. Right paracentral superiorly migrating extrusion impinging on the descending right L5 nerve root at the subarticular recess behind L5. Patent foramina IMPRESSION: L5-S1 focal disc degeneration with superiorly migrating right paracentral extrusion impinging on the descending right L5 nerve root. Electronically Signed   By: Jorje Guild M.D.   On: 04/23/2021 06:07    Procedures Procedures   Medications Ordered in ED Medications - No data to display  ED Course  I have reviewed the triage vital signs and the nursing notes.  Pertinent labs & imaging results that were available during my care of the patient were reviewed by me and considered in my medical decision making (see chart for details).    MDM Rules/Calculators/A&P                           45 year old female presenting to the emergency  department with a chief complaint of right leg pain/back pain and numbness and weakness with associated muscle spasm.  The patient states that she has had the pain for several weeks but it has been worsening over the past few days.  She has had some difficulty with ambulation.  She saw her PCP did a trial with  muscle accidents and had no significant improvement.  She did urinate on herself twice earlier today as mentioned in triage but has had no further episodes of urinary incontinence.  She was ambulatory in triage with some pain.  Pain is described as sharp, severe, radiating down her right leg, worse with movement and bending, better with rest.  Endorses worsening numbness in her right leg.  Patient had urinalysis which revealed trace hemoglobin, moderate leukocytes, negative nitrites, 6-10 WBCs, rare bacteria, low suspicion for UTI at this time.  Urine pregnancy was also negative.  The patient underwent MRI of the lumbar spine which revealed the following: IMPRESSION:  L5-S1 focal disc degeneration with superiorly migrating right  paracentral extrusion impinging on the descending right L5 nerve  root.       The degenerative disc disease with compression of the right L5 nerve root is likely the etiology of the patient's symptoms today.  No evidence for cauda equina on MRI imaging or conus medullaris.  Patient is overall amatory with 5 out of 5 strength bilaterally.  Presenting with sciatica and nerve compression symptoms.  A referral to neurosurgery was placed for follow-up in clinic to discuss further management.  Patient was prescribed gabapentin for nerve pain and advised continued NSAIDs and Flexeril as tolerated.   Final Clinical Impression(s) / ED Diagnoses Final diagnoses:  Bulging lumbar disc    Rx / DC Orders ED Discharge Orders          Ordered    Ambulatory referral to Neurosurgery        04/23/21 0858    gabapentin (NEURONTIN) 100 MG capsule  3 times daily        04/23/21 0900             Regan Lemming, MD 04/24/21 [phone]

## 2021-04-23 NOTE — Progress Notes (Signed)
Pt was brought to MRI and when she realized the exam had been ordered without contrast, she requested to go back over to the ED to speak with the Dr. because the patient wants her exam to be done with and without contrast. I complied by sending her back to the ED.

## 2021-04-23 NOTE — Progress Notes (Signed)
° ° °  SUBJECTIVE:   CHIEF COMPLAINT / HPI:   Vaginal Discharge: Patient is a 45 y.o. female presenting with vaginal discharge for 2-3 days.  She states the discharge is of whitish consistency.  She states that she had a Diflucan pill left over and so she took this on Sunday but several hours later noticed that she had redness and itching around her mouth, hands, and face.  She took a Benadryl thinking she was having an allergic reaction.  She endorses no additional medications that day outside of her norm.  Patient states that she just wants to be tested for BV/yeast as well as HIV.  She does not want to be tested for GC/chlamydia or syphilis.  PERTINENT  PMH / PSH: None relevant  OBJECTIVE:   Ht 5\' 2"  (1.575 m)    Wt 170 lb 6.4 oz (77.3 kg)    LMP 04/08/2021    BMI 31.17 kg/m    General: NAD, pleasant, able to participate in exam Respiratory: Normal effort, no obvious respiratory distress Pelvic: VULVA: normal appearing vulva with no masses, tenderness or lesions, VAGINA: Normal appearing vagina with normal color, no lesions, with scant and white discharge present, Chaperone Alexis present for pelvic exam  ASSESSMENT/PLAN:   Vaginal discharge: 45 y.o. female with vaginal discharge for 2-3 days. Physical exam significant for scant whitish discharge.  She did take a Diflucan pill last night but had what seems to be an allergic reaction after taking it.  Wet prep performed today shows was negative which is consistent with the patient having vaginal candidiasis and taking the Diflucan which likely cured the overgrowth.  Patient is not interested in screening for GC/chlamydia or syphilis but would like to be checked for HIV. Plan: -Wet prep as above.  Due to concern for allergic reaction to Diflucan we will update patient's allergies and will recommend in the future consideration for terbinafine should she have any future bouts of vaginal candidiasis -Will check HIV   Lurline Del, DO [facility]

## 2021-04-23 NOTE — Discharge Instructions (Addendum)
You were evaluated in the Emergency Department and after careful evaluation, we did not find any emergent condition requiring admission or further testing in the hospital.  Your exam/testing today was overall reassuring. Recommend continuing NSAIDs, Tylenol, physical therapy exercises, and avoid strenuous activity to include heavy lifting or bending over. We will try to add gabapentin to your medication regimen. Follow-up with your PCP and in addition a referral to a neurosurgeon has been placed.   Please return to the Emergency Department if you experience any worsening of your condition.  Thank you for allowing [place] to be a part of your care.

## 2021-04-23 NOTE — ED Triage Notes (Signed)
Ambulatory to triage room. Pt says she has been having lower back pain since the beginning of november, seen her provider about the same. When she went to get out of bed tonight she stood up and she could not feel her leg and she fell down. Pain radiates down from the right lower back down the leg. Leg feels "numb" down to the right foot.

## 2021-04-24 ENCOUNTER — Ambulatory Visit: Payer: PRIVATE HEALTH INSURANCE | Admitting: Orthopaedic Surgery

## 2021-04-25 ENCOUNTER — Telehealth: Payer: Self-pay | Admitting: Family Medicine

## 2021-04-25 NOTE — Telephone Encounter (Signed)
Spoke with patient she stated that she had an MRI done at the ER. They told her that she may have an infection of the spine. To have her PCP collect labs to test for viral or bacterial infection of the spine. Patient will be out of town for the holidays and will be back after the new year. She wanted to know if the doctor can have the labs go through [facility] and maybe she can have it done while in [place]. Salvatore Marvel, CMA

## 2021-04-25 NOTE — Telephone Encounter (Signed)
Patient would like for Dr. Susa Simmonds to call her to discuss some testing and labs she would like to have done.   The best call back is [phone]

## 2021-04-25 NOTE — Telephone Encounter (Signed)
2ND attempt to reach out to patient to see what type of labs and testing she would like done. No answer. Unable to LVM will try later. Salvatore Marvel, CMA

## 2021-04-26 ENCOUNTER — Telehealth: Payer: Self-pay | Admitting: Family Medicine

## 2021-04-26 ENCOUNTER — Ambulatory Visit: Payer: PRIVATE HEALTH INSURANCE | Admitting: Orthopaedic Surgery

## 2021-04-26 NOTE — Telephone Encounter (Signed)
Spoke with patient informed of note below. All patient said was ok. Salvatore Marvel, CMA

## 2021-04-26 NOTE — Telephone Encounter (Signed)
Called pt to discuss MRI results.  She was concerned about possible infection in her vertebrae. Reviewed MRI with pt and noted there is no evidence of infection in her vertebrae. She does have an L5 disc impingement. Advised her to continue taking Gabapentin and has follow up scheduled with neurosurgeon in February.     Lyndee Hensen, DO PGY-3, [facility] 04/26/2021

## 2021-04-27 ENCOUNTER — Encounter: Payer: Self-pay | Admitting: Family Medicine

## 2021-05-01 ENCOUNTER — Encounter: Payer: Self-pay | Admitting: Family Medicine

## 2021-05-02 ENCOUNTER — Encounter: Payer: Self-pay | Admitting: Neurology

## 2021-05-02 ENCOUNTER — Encounter: Payer: Self-pay | Admitting: Family Medicine

## 2021-05-02 DIAGNOSIS — M5417 Radiculopathy, lumbosacral region: Secondary | ICD-10-CM

## 2021-05-02 NOTE — Telephone Encounter (Signed)
Patient is asking for a referral neurosurgery, physical therapy and pain management.  She is currently out of town in [place] and wants to try some of the offices there.  If you can place these referrals in her mychart so she can access them when she finds the office with a quick appt, she would prefer this option.  We can also fax the referrals to her at [phone].  Rylynne Schicker,CMA

## 2021-05-16 ENCOUNTER — Encounter: Payer: Self-pay | Admitting: Family Medicine

## 2021-05-17 ENCOUNTER — Encounter: Payer: Self-pay | Admitting: Family Medicine

## 2021-05-17 NOTE — Telephone Encounter (Signed)
Called patient to discuss symptoms further. Patient reports that she has been having soreness in both calves and recently noticed bruising on both sides. Patient denies recent trauma or injury. Denies redness, warmth or swelling.   She had similar pain back in July and was evaluated. Per note from 7/29, [place] of legs were normal and there was no concern for DVT.   Scheduled patient for next available appointment. Advised of ED precautions.   Talbot Grumbling, RN

## 2021-05-21 ENCOUNTER — Ambulatory Visit (INDEPENDENT_AMBULATORY_CARE_PROVIDER_SITE_OTHER): Payer: BLUE CROSS/BLUE SHIELD | Admitting: Family Medicine

## 2021-05-21 ENCOUNTER — Other Ambulatory Visit: Payer: Self-pay

## 2021-05-21 VITALS — BP 116/79 | HR 67 | Ht 62.0 in | Wt 169.2 lb

## 2021-05-21 DIAGNOSIS — Z13220 Encounter for screening for lipoid disorders: Secondary | ICD-10-CM

## 2021-05-21 DIAGNOSIS — B3731 Acute candidiasis of vulva and vagina: Secondary | ICD-10-CM

## 2021-05-21 DIAGNOSIS — Z131 Encounter for screening for diabetes mellitus: Secondary | ICD-10-CM

## 2021-05-21 DIAGNOSIS — M5416 Radiculopathy, lumbar region: Secondary | ICD-10-CM | POA: Diagnosis not present

## 2021-05-21 DIAGNOSIS — Z0000 Encounter for general adult medical examination without abnormal findings: Secondary | ICD-10-CM

## 2021-05-21 DIAGNOSIS — Z114 Encounter for screening for human immunodeficiency virus [HIV]: Secondary | ICD-10-CM | POA: Diagnosis not present

## 2021-05-21 DIAGNOSIS — R252 Cramp and spasm: Secondary | ICD-10-CM

## 2021-05-21 DIAGNOSIS — M79604 Pain in right leg: Secondary | ICD-10-CM | POA: Insufficient documentation

## 2021-05-21 DIAGNOSIS — M79605 Pain in left leg: Secondary | ICD-10-CM

## 2021-05-21 DIAGNOSIS — B370 Candidal stomatitis: Secondary | ICD-10-CM | POA: Insufficient documentation

## 2021-05-21 HISTORY — DX: Candidal stomatitis: B37.0

## 2021-05-21 HISTORY — DX: Acute candidiasis of vulva and vagina: B37.31

## 2021-05-21 MED ORDER — NYSTATIN 100000 UNIT/ML MT SUSP: 5.0000 mL | Freq: Four times a day (QID) | OROMUCOSAL | 0 refills | Status: DC

## 2021-05-21 MED ORDER — MICONAZOLE NITRATE APPLICATOR 100 & 2 MG-% (9GM) VA KIT: 100.0000 mg | PACK | Freq: Every evening | VAGINAL | 2 refills | Status: DC

## 2021-05-21 MED ORDER — TERCONAZOLE 0.4 % VA CREA: 1.0000 | TOPICAL_CREAM | Freq: Every day | VAGINAL | 0 refills | Status: DC

## 2021-05-21 MED ORDER — CYCLOBENZAPRINE HCL 5 MG PO TABS: 5.0000 mg | ORAL_TABLET | Freq: Three times a day (TID) | ORAL | 0 refills | Status: DC | PRN

## 2021-05-21 NOTE — Progress Notes (Signed)
SUBJECTIVE:   CHIEF COMPLAINT / HPI:   Sore/bruised legs: patient presents with several months of sore legs. This started in October, but has persisted. Soreness is felt b/l, on lateral aspect of legs. She reports she saw "bruising" last week, photos taken for context. She denies swelling, rashes, change in work out routine. She has no history of DVT, she last had surgery (cosmetic) in July 2022, no recent flights or car rides. She has started no new medications. She is a non-smoker. Pain is present both at rest and with movement. Patient does have a history of sciatica and has confirmed L5 impingement, but reports that the pain in her lower legs is not like the radicular symptoms she has had before (numbness/tingling that radiates) but more an aching soreness. She had a DVT done in August 2022 that was WNL. Will obtain ABIs today.   Yeast infection: patient reports yeast infection, was seen last month and treated with fluconazole and had an allergic reaction (rash, swelling of tongue). She is having similar white vaginal discharge and itching. She has also noticed a white plaque in her mouth in the morning that goes away after brushing her teeth. She reports concern that this is also yeast. She agrees to have HIV and A1c checked for good measure. She declines swabs for wet prep, GC today after counseling. She requests more than one topical option for treatment after having allergic reaction to fluconazole.  Screen lipoid disorder: patient >32 yo opts for lipid panel screen.  PERTINENT  PMH / PSH: pituitary tumor  OBJECTIVE:   BP 116/79    Pulse 67    Ht 5\' 2"  (1.575 m)    Wt 169 lb 4 oz (76.8 kg)    LMP 05/04/2021    SpO2 100%    BMI 30.96 kg/m   Nursing note and vitals reviewed GEN: age appropriate AAW, resting comfortably in chair, NAD, WNWD HEENT: NCAT. PERRLA. Sclera without injection or icterus. MMM. Oropharynx with scrapable white lesion along left buccal surface. Neck: Supple. No  LAD Cardiac: Regular rate and rhythm. Normal S1/S2. No murmurs, rubs, or gallops appreciated. 2+ radial pulses. Lumbar spine: - Inspection: no gross deformity or asymmetry, swelling or ecchymosis. No skin changes - ROM: full active ROM of the lumbar spine in flexion and extension without pain - Strength: 5/5 strength of lower extremity in L4-S1 nerve root distributions b/l - Neuro: sensation intact in the L4-S1 nerve root distribution b/l, 2+ L4 and S1 reflexes Lower extremities: no edema, no rash, very slight hyperpigmentation on left lateral calf, no erythema, warmth, or redness. +mild TTP to touch on lateral lower legs Neuro: AOx3  Psych: Pleasant and appropriate   ASSESSMENT/PLAN:   Vaginal candidiasis Will offer patient trial of terconazole cream, as well as miconazole cr and miconazole suppository given allergic reaction to oral fluconazole. Whatever works best for patient, she may use.   Oral candidiasis Will check A1c and HIV for good measure. Tx with nystatin swish and spit for 7 days. Discussed signs of allergic reaction.  Bilateral leg pain Symptoms are not likely to be due to known lumbar radiculopathy. Ddx includes electrolyte imbalance, mild dehydration, more remotely muscle break down (genetic disorder highly unlikely given history and age). Patient does not have swelling, no signs of chronic venous insufficiency, ABIs were normal today (1.23, 1.17). Highly unlikely to be DVT given chronicity, history, and physical exam. Suspect hyperpigmentation is normal variant, but photos of each lateral leg were taken for future  comparison. Will obtain metabolic panel, mg, ck, CBC. Recommend patient hydrate well, 1 to 1.5 liters of water per day. Will rule out common causes due to persistent leg pain, but I am reassured there is no acute, dangerous pathology. Gave patient reassurance as well, suspect worried well.  Healthcare maintenance Lipid panel obtained today per patient request.  Appropriate for screening of lipoid disorders given demographics.      Gladys Damme, MD [facility]

## 2021-05-21 NOTE — Assessment & Plan Note (Signed)
Lipid panel obtained today per patient request. Appropriate for screening of lipoid disorders given demographics.

## 2021-05-21 NOTE — Assessment & Plan Note (Signed)
Will offer patient trial of terconazole cream, as well as miconazole cr and miconazole suppository given allergic reaction to oral fluconazole. Whatever works best for patient, she may use.

## 2021-05-21 NOTE — Assessment & Plan Note (Signed)
Will check A1c and HIV for good measure. Tx with nystatin swish and spit for 7 days. Discussed signs of allergic reaction.

## 2021-05-21 NOTE — Patient Instructions (Addendum)
It was a pleasure to see you today!  For yeast infection: for mouth, rinse and spit 4x per day for 7 days. Vaginally you can use suppository internally and cream externally for relief For the cramping in your legs: thankfully, I think a DVT is highly unlikely. We did ABI or testing of the blood flow in your arteries in your legs that showed normal blood flow, which is very reassuring. I will also check some blood work today to look for causes of muscle cramping like electrolyte imbalance. We will get some labs today.  If they are abnormal or we need to do something about them, I will call you.  If they are normal, I will send you a message on MyChart (if it is active) or a letter in the mail.  If you don't hear from [place] in 2 weeks, please call the office  [phone]. I also recommend drinking about 1 to 1.5 liters of water per day. The cramping could be a sign of mild dehydration. Follow up will be based on results of the blood tests. If you have swelling or pain of one leg, call our office for same day appt or go to ED   Be Well,  Dr. Chauncey Reading

## 2021-05-21 NOTE — Assessment & Plan Note (Addendum)
Symptoms are not likely to be due to known lumbar radiculopathy. Ddx includes electrolyte imbalance, mild dehydration, more remotely muscle break down (genetic disorder highly unlikely given history and age). Patient does not have swelling, no signs of chronic venous insufficiency, ABIs were normal today (1.23, 1.17). Highly unlikely to be DVT given chronicity, history, and physical exam. Suspect hyperpigmentation is normal variant, but photos of each lateral leg were taken for future comparison. Will obtain metabolic panel, mg, ck, CBC. Recommend patient hydrate well, 1 to 1.5 liters of water per day. Will rule out common causes due to persistent leg pain, but I am reassured there is no acute, dangerous pathology. Gave patient reassurance as well, suspect worried well.

## 2021-05-22 ENCOUNTER — Encounter: Payer: Self-pay | Admitting: Family Medicine

## 2021-05-22 ENCOUNTER — Other Ambulatory Visit: Payer: Self-pay | Admitting: Family Medicine

## 2021-05-22 DIAGNOSIS — R252 Cramp and spasm: Secondary | ICD-10-CM

## 2021-05-22 DIAGNOSIS — Z130 Encounter for screening for diseases of the blood and blood-forming organs and certain disorders involving the immune mechanism: Secondary | ICD-10-CM

## 2021-05-22 NOTE — Progress Notes (Signed)
Adding on CBC to labwork from yesterday for leg pain and soreness.  Gladys Damme, MD [facility], PGY-3

## 2021-05-23 LAB — CK: Total CK: 70 U/L (ref 32–182)

## 2021-05-23 LAB — LIPID PANEL
Chol/HDL Ratio: 4.6 ratio — ABNORMAL HIGH (ref 0.0–4.4)
Cholesterol, Total: 210 mg/dL — ABNORMAL HIGH (ref 100–199)
HDL: 46 mg/dL (ref >39)
LDL Chol Calc (NIH): 144 mg/dL — ABNORMAL HIGH (ref 0–99)
Triglycerides: 110 mg/dL (ref 0–149)
VLDL Cholesterol Cal: 20 mg/dL (ref 5–40)

## 2021-05-23 LAB — BASIC METABOLIC PANEL
BUN/Creatinine Ratio: 16 (ref 9–23)
BUN: 11 mg/dL (ref 6–24)
CO2: 22 mmol/L (ref 20–29)
Calcium: 9.6 mg/dL (ref 8.7–10.2)
Chloride: 100 mmol/L (ref 96–106)
Creatinine, Ser: 0.68 mg/dL (ref 0.57–1.00)
Glucose: 83 mg/dL (ref 70–99)
Potassium: 4.6 mmol/L (ref 3.5–5.2)
Sodium: 137 mmol/L (ref 134–144)
eGFR: 109 mL/min/{1.73_m2} (ref >59)

## 2021-05-23 LAB — HEMOGLOBIN A1C
Est. average glucose Bld gHb Est-mCnc: 114 mg/dL
Hgb A1c MFr Bld: 5.6 % (ref 4.8–5.6)

## 2021-05-23 LAB — HIV ANTIBODY (ROUTINE TESTING W REFLEX): HIV Screen 4th Generation wRfx: NONREACTIVE

## 2021-05-23 LAB — MAGNESIUM: Magnesium: 2.1 mg/dL (ref 1.6–2.3)

## 2021-05-24 ENCOUNTER — Ambulatory Visit: Payer: PRIVATE HEALTH INSURANCE

## 2021-05-24 LAB — CBC WITH DIFFERENTIAL/PLATELET
Basophils Absolute: 0.1 10*3/uL (ref 0.0–0.2)
Basos: 1 %
EOS (ABSOLUTE): 0.1 10*3/uL (ref 0.0–0.4)
Eos: 1 %
Hematocrit: 41.7 % (ref 34.0–46.6)
Hemoglobin: 13 g/dL (ref 11.1–15.9)
Immature Grans (Abs): 0 10*3/uL (ref 0.0–0.1)
Immature Granulocytes: 1 %
Lymphocytes Absolute: 2 10*3/uL (ref 0.7–3.1)
Lymphs: 34 %
MCH: 27.5 pg (ref 26.6–33.0)
MCHC: 31.2 g/dL — ABNORMAL LOW (ref 31.5–35.7)
MCV: 88 fL (ref 79–97)
Monocytes Absolute: 0.5 10*3/uL (ref 0.1–0.9)
Monocytes: 9 %
Neutrophils Absolute: 3.3 10*3/uL (ref 1.4–7.0)
Neutrophils: 54 %
Platelets: 332 10*3/uL (ref 150–450)
RBC: 4.73 x10E6/uL (ref 3.77–5.28)
RDW: 14.1 % (ref 11.7–15.4)
WBC: 6 10*3/uL (ref 3.4–10.8)

## 2021-05-24 LAB — SPECIMEN STATUS REPORT

## 2021-05-26 NOTE — Therapy (Incomplete)
OUTPATIENT PHYSICAL THERAPY THORACOLUMBAR EVALUATION   Patient Name: Kristin Daniels MRN: [id number] DOB:[birth year], 46 y.o., female Today's Date: 05/26/2021    Past Medical History:  Diagnosis Date   Acid reflux    Colon polyp    H. pylori infection    History of seasonal allergies    Hypercholesterolemia    Migraines    Pituitary tumor    Thyroid nodule    Vaginal Pap smear, abnormal    Past Surgical History:  Procedure Laterality Date   CESAREAN SECTION     OTHER SURGICAL HISTORY     hemangio removed at child and had something added to head to help indentation   Patient Active Problem List   Diagnosis Date Noted   Vaginal candidiasis 05/21/2021   Oral candidiasis 05/21/2021   Bilateral leg pain 05/21/2021   Healthcare maintenance 05/21/2021   Lumbar radiculopathy, acute 04/10/2021   Costochondral chest pain 12/01/2020   Pain of right lower leg 12/01/2020   Abdominal wall fluid collections 12/01/2020   Preoperative evaluation to rule out surgical contraindication 10/23/2020   Seasonal allergic rhinitis due to pollen 08/28/2020   Acne 07/26/2020   Physically well but worried 07/26/2020   H. 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  ONSET DATE: ***  SUBJECTIVE:                                                                                                                                                                                            SUBJECTIVE STATEMENT: *** PERTINENT HISTORY:  ***  PAIN:  Are you having pain? {yes/no:20286} NPRS scale: ***/10 Pain location: *** Pain orientation: {Pain Orientation:25161}  PAIN TYPE: {type:313116} Pain description: {PAIN DESCRIPTION:21022940}  Aggravating factors: *** Relieving factors: ***  PRECAUTIONS: {Therapy precautions:24002}  WEIGHT BEARING RESTRICTIONS {Yes ***/No:24003}  FALLS:  Has patient fallen in last 6 months? {yes/no:20286}, Number of falls: ***  LIVING ENVIRONMENT: Lives with: {[facility] lives with:25569::"lives with their family"} Lives in: {Lives in:25570} Stairs: {yes/no:20286}; {Stairs:24000} Has following equipment at home: {Assistive devices:23999}  OCCUPATION: ***  PLOF: {PLOF:24004}  PATIENT GOALS ***   OBJECTIVE:   DIAGNOSTIC  FINDINGS:  04/23/2022: MR Lumbar Spine without contrast: IMPRESSION: L5-S1 focal disc degeneration with superiorly migrating right paracentral extrusion impinging on the descending right L5 nerve root.  PATIENT SURVEYS:  {rehab surveys:24030}  SCREENING FOR RED FLAGS: Bowel or bladder incontinence: {Yes/No:304960894} Cauda equina syndrome: {Yes/No:304960894}  COGNITION:  Overall cognitive status: {cognition:24006}     SENSATION:  Light touch: {intact/deficits:24005}   MUSCLE LENGTH: Hamstrings: Right *** deg; Left *** deg Thomas test: Right *** deg; Left *** deg  POSTURE:  ***  PALPATION: ***  LUMBARAROM/PROM  A/PROM A/PROM  05/26/2021  Flexion   Extension   Right lateral flexion   Left lateral flexion   Right rotation   Left rotation    (Blank rows = not tested)  LE AROM/PROM:  A/PROM Right 05/26/2021 Left 05/26/2021  Hip flexion    Hip extension    Hip abduction    Hip adduction    Hip internal rotation    Hip external rotation    Knee flexion    Knee extension    Ankle dorsiflexion    Ankle plantarflexion    Ankle inversion    Ankle eversion     (Blank rows = not  tested)  LE MMT:  MMT Right 05/26/2021 Left 05/26/2021  Hip flexion    Hip extension    Hip abduction    Hip adduction    Hip internal rotation    Hip external rotation    Knee flexion    Knee extension    Ankle dorsiflexion    Ankle plantarflexion    Ankle inversion    Ankle eversion     (Blank rows = not tested)  LUMBAR SPECIAL TESTS:  {lumbar special test:25242}  FUNCTIONAL TESTS:  {Functional tests:24029}  GAIT: Distance walked: *** Assistive device utilized: {Assistive devices:23999} Level of assistance: {Levels of assistance:24026} Comments: ***    TODAY'S TREATMENT  [facility] Adult PT Treatment:                                                DATE: 05/29/2021 Therapeutic Exercise: *** Manual Therapy: *** Neuromuscular re-ed: *** Therapeutic Activity: *** Modalities: *** Self Care: ***    PATIENT EDUCATION:  Education details: *** Person educated: {Person educated:25204} Education method: {Education Method:25205} Education comprehension: {Education Comprehension:25206}   HOME EXERCISE PROGRAM: ***  ASSESSMENT:  CLINICAL IMPRESSION: Patient is a *** y.o. *** who was seen today for physical therapy evaluation and treatment for ***. Objective impairments include {opptimpairments:25111}. These impairments are limiting patient from {activity limitations:25113}. Personal factors including {Personal factors:25162} are also affecting patient's functional outcome. Patient will benefit from skilled PT to address above impairments and improve overall function.  REHAB POTENTIAL: {rehabpotential:25112}  CLINICAL DECISION MAKING: {clinical decision making:25114}  EVALUATION COMPLEXITY: {Evaluation complexity:25115}   GOALS: Goals reviewed with patient? {yes/no:20286}  SHORT TERM GOALS:  STG Name Target Date Goal status  1 *** Baseline:  {follow up:25551} {GOALSTATUS:25110}  2 *** Baseline:  {follow up:25551} {GOALSTATUS:25110}  3 *** Baseline:  {follow up:25551} {GOALSTATUS:25110}  4 *** Baseline: {follow up:25551} {GOALSTATUS:25110}  5 *** Baseline: {follow up:25551} {GOALSTATUS:25110}  6 *** Baseline: {follow up:25551} {GOALSTATUS:25110}  7 *** Baseline: {follow up:25551} {GOALSTATUS:25110}   LONG TERM GOALS:   LTG Name Target Date Goal status  1 *** Baseline: {follow up:25551} {GOALSTATUS:25110}  2 *** Baseline: {follow up:25551} {GOALSTATUS:25110}  3 *** Baseline: {follow up:25551} {GOALSTATUS:25110}  4 *** Baseline: {follow up:25551} {GOALSTATUS:25110}  5 *** Baseline: {follow up:25551} {GOALSTATUS:25110}  6 *** Baseline: {follow up:25551} {GOALSTATUS:25110}  7 *** Baseline: {follow up:25551} {GOALSTATUS:25110}   PLAN: PT FREQUENCY: {rehab frequency:25116}  PT DURATION: {rehab duration:25117}  PLANNED INTERVENTIONS: {rehab planned interventions:25118::"Therapeutic exercises","Therapeutic activity","Neuro Muscular re-education","Balance training","Gait training","Patient/Family education","Joint mobilization"}  PLAN FOR NEXT SESSION: Vanessa [place], PT, DPT 05/26/21 10:29 AM

## 2021-05-28 ENCOUNTER — Other Ambulatory Visit: Payer: Self-pay

## 2021-05-28 ENCOUNTER — Encounter: Payer: Self-pay | Admitting: Obstetrics & Gynecology

## 2021-05-28 ENCOUNTER — Other Ambulatory Visit (HOSPITAL_COMMUNITY)
Admission: RE | Admit: 2021-05-28 | Discharge: 2021-05-28 | Disposition: A | Discharge status: Home / Self Care | Payer: PRIVATE HEALTH INSURANCE | Source: Ambulatory Visit | Attending: Obstetrics & Gynecology | Admitting: Obstetrics & Gynecology

## 2021-05-28 ENCOUNTER — Ambulatory Visit: Payer: PRIVATE HEALTH INSURANCE | Admitting: Obstetrics & Gynecology

## 2021-05-28 VITALS — BP 123/73 | HR 104 | Resp 16 | Ht 62.0 in | Wt 172.0 lb

## 2021-05-28 DIAGNOSIS — B3731 Acute candidiasis of vulva and vagina: Secondary | ICD-10-CM | POA: Insufficient documentation

## 2021-05-28 DIAGNOSIS — R102 Pelvic and perineal pain: Secondary | ICD-10-CM | POA: Diagnosis not present

## 2021-05-28 DIAGNOSIS — K5904 Chronic idiopathic constipation: Secondary | ICD-10-CM

## 2021-05-28 NOTE — Progress Notes (Signed)
Subjective:    Patient ID: Kristin Daniels, female    DOB: 23-Nov-1975, 46 y.o.   MRN: [id number]  HPI  Kristin Daniels is a 46 year old female who presents for right-sided abdominal pain.  She was recently seen in the ED for the pain and also had an MRI.  Results are below.  She has a history of chronic abdominal pain has had 3 colonoscopies in the past.  She has a history of chronic constipation and her GI doctor put her on Dulcolax and as needed MiraLAX.  She takes the MiraLAX a couple times a week but still has hard stools.  She does not have a bowel movement every day.  Patient does not have any pain with urination but does not feel like she fully empties her bladder and has to apply suprapubic pressure to feel like she empties her bladder.  Patient has history of a ultrasound in [place] which I can see through care everywhere.  There was a small follicle in the posterior fibroid.  Her posterior fibroid has grown slightly.  She has a 4 cm follicle on her right ovary.  Patient is disc disease can also cause pelvic pain.  CLINICAL DATA:  Back trauma with no prior imaging. Incontinence and right leg numbness.   EXAM: MRI LUMBAR SPINE WITHOUT CONTRAST   TECHNIQUE: Multiplanar, multisequence MR imaging of the lumbar spine was performed. No intravenous contrast was administered.   COMPARISON:  None.   FINDINGS: Segmentation: 5 lumbar type vertebrae based on the available coverage   Alignment:  Physiologic.   Vertebrae:  No fracture, evidence of discitis, or bone lesion.   Conus medullaris and cauda equina: Conus extends to the L1-2 level. Conus and cauda equina appear normal.   Paraspinal and other soft tissues: Posterior intramural uterine fibroid measuring 2.5 cm. 4 cm right ovarian cyst with simple MR appearance. No follow-up imaging recommended. Note: This recommendation does not apply to premenarchal patients and to those with increased risk (genetic, family history, elevated tumor  markers or other high-risk factors) of ovarian cancer. Reference: JACR 2020 Feb; 17(2):248-254   Disc levels:   L4-L5: Minor facet spurring   L5-S1:Disc narrowing and desiccation with bulge. Right paracentral superiorly migrating extrusion impinging on the descending right L5 nerve root at the subarticular recess behind L5. Patent foramina   IMPRESSION: L5-S1 focal disc degeneration with superiorly migrating right paracentral extrusion impinging on the descending right L5 nerve root.     Electronically Signed   By: Kristin Daniels M.D.   On: 04/23/2021 06:07 Review of Systems  Constitutional: Negative.   Respiratory: Negative.    Cardiovascular: Negative.   Gastrointestinal: Negative.   Genitourinary: Negative.       Objective:   Physical Exam Vitals reviewed.  Constitutional:      General: She is not in acute distress.    Appearance: She is well-developed.  HENT:     Head: Normocephalic and atraumatic.  Eyes:     Conjunctiva/sclera: Conjunctivae normal.  Cardiovascular:     Rate and Rhythm: Normal rate.  Pulmonary:     Effort: Pulmonary effort is normal.  Genitourinary:    Comments: Tanner V Vulva:  No lesion Vagina:  Pink, no lesions, no discharge, no blood Cervix:  No CMT Uterus:  Non tender, mobile Right adnexa--non tender, no mass Left adnexa--non tender, no mass Skin:    General: Skin is warm and dry.  Neurological:     Mental Status: She is alert and oriented to person, place,  and time.  Psychiatric:        Mood and Affect: Mood normal.   Vitals:   05/28/21 1500  BP: 123/73  Pulse: (!) 104  Resp: 16  Weight: 172 lb (78 kg)  Height: 5\' 2"  (1.575 m)    Assessment & Plan:  46 yo female presents for right lower quadrant pain and follow-up of ovarian cyst found on MRI and feeling of having a yeast infection   After review of records and counseling, the patient understands that having a 4 cm follicle on her right ovary is normal for people who  are premenopausal.  They given that she has continued pain, I have ordered a transvaginal ultrasound to look at the right ovary to ensure resolution. Patient also should be using MiraLAX daily until all bowel movements are soft. Patient should continue with physical therapy and hopefully they can also address some of her pelvic pain. Aptima for vaginal d/c and feelings of yeast infection Return to clinic after the next ultrasound.  40 minutes was spent during this encounter.  This includes review of records at [facility] health, review of records in care everywhere, history and physical, counseling, and documentation.

## 2021-05-29 ENCOUNTER — Other Ambulatory Visit: Payer: Self-pay

## 2021-05-29 ENCOUNTER — Ambulatory Visit: Payer: PRIVATE HEALTH INSURANCE

## 2021-05-29 ENCOUNTER — Ambulatory Visit: Payer: PRIVATE HEALTH INSURANCE | Attending: Family Medicine

## 2021-05-29 ENCOUNTER — Other Ambulatory Visit: Payer: Self-pay | Admitting: Obstetrics & Gynecology

## 2021-05-29 DIAGNOSIS — G8929 Other chronic pain: Secondary | ICD-10-CM | POA: Diagnosis present

## 2021-05-29 DIAGNOSIS — M5440 Lumbago with sciatica, unspecified side: Secondary | ICD-10-CM | POA: Insufficient documentation

## 2021-05-29 DIAGNOSIS — M5417 Radiculopathy, lumbosacral region: Secondary | ICD-10-CM | POA: Diagnosis not present

## 2021-05-29 DIAGNOSIS — M6281 Muscle weakness (generalized): Secondary | ICD-10-CM | POA: Diagnosis present

## 2021-05-29 LAB — CERVICOVAGINAL ANCILLARY ONLY
Bacterial Vaginitis (gardnerella): NEGATIVE
Candida Glabrata: NEGATIVE
Candida Vaginitis: POSITIVE — AB
Comment: NEGATIVE
Comment: NEGATIVE
Comment: NEGATIVE

## 2021-05-29 NOTE — Therapy (Signed)
OUTPATIENT PHYSICAL THERAPY THORACOLUMBAR EVALUATION   Patient Name: Kristin Daniels MRN: [id number] DOB:1975-09-08, 46 y.o., female Today's Date: 05/29/2021   PT End of Session - 05/29/21 1835     Visit Number 1    Number of Visits 17    Date for PT Re-Evaluation 07/24/21    Authorization Type [place] [facility]    Authorization Time Period Pending Auth    PT Start Time 7867    PT Stop Time 1835    PT Time Calculation (min) 45 min    Activity Tolerance Patient tolerated treatment well    Behavior During Therapy WFL for tasks assessed/performed             Past Medical History:  Diagnosis Date   Acid reflux    Colon polyp    H. pylori infection    History of seasonal allergies    Hypercholesterolemia    Migraines    Pituitary tumor    Thyroid nodule    Vaginal Pap smear, abnormal    Past Surgical History:  Procedure Laterality Date   CESAREAN SECTION     OTHER SURGICAL HISTORY     hemangio removed at child and had something added to head to help indentation   Patient Active Problem List   Diagnosis Date Noted   Vaginal candidiasis 05/21/2021   Oral candidiasis 05/21/2021   Bilateral leg pain 05/21/2021   Lumbar radiculopathy, acute 04/10/2021   Costochondral chest pain 12/01/2020   Pain of right lower leg 12/01/2020   Abdominal wall fluid collections 12/01/2020   Seasonal allergic rhinitis due to pollen 08/28/2020   Acne 07/26/2020   Physically well but worried 07/26/2020   H. 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   Uterine leiomyoma 03/14/2017    PCP: Lyndee Hensen, DO  REFERRING PROVIDER: McDiarmid, Blane Ohara, MD  REFERRING DIAG: M54.17  (ICD-10-CM) - Lumbosacral radiculopathy at L5  THERAPY DIAG:  Chronic bilateral low back pain with sciatica, sciatica laterality unspecified  Muscle weakness (generalized)  ONSET DATE: 02/2021  SUBJECTIVE:                                                                                                                                                                                           SUBJECTIVE STATEMENT: Pt reports primary c/o chronic LBP and BIL LE pain and  N/T of insidious onset beginning in October of last year.  She reports that it began as only Rt LE sxs, but progressed to BIL LE. She adds that these symptoms have improved the past two weeks without any conservative treatment, but she still has moderate sxs. Lumbar MRI on 04/23/2022 shows L5-S1 focal disc degeneration with superiorly migrating right paracentral extrusion impinging on the descending right L5 nerve root. She expresses fear with the idea of returning to performing gym workouts. She states she is scared to mess it up again. Red flag screening negative.   PERTINENT HISTORY:  N/A  PAIN:  Are you having pain? Yes NPRS scale: 6/10 Pain location: Low back Pain orientation: Right and Left  PAIN TYPE: aching Aggravating factors: Standing after prolonged sitting Relieving factors: Heating pad  PRECAUTIONS: None  WEIGHT BEARING RESTRICTIONS No  FALLS:  Has patient fallen in last 6 months? No, Number of falls: 0  LIVING ENVIRONMENT: Lives with: lives with their family Lives in: House/apartment Stairs: Yes; Internal: 4 steps; on right going up and on left going up and External: 15 steps; on right going up and on left going up Has following equipment at home: None  OCCUPATION: Unemployed  PLOF: Independent  PATIENT GOALS: Return to working out without pain   OBJECTIVE:   DIAGNOSTIC FINDINGS:  04/23/2022: MR Lumbar Spine without contrast: IMPRESSION: L5-S1 focal disc degeneration with superiorly migrating  right paracentral extrusion impinging on the descending right L5 nerve root.   SCREENING FOR RED FLAGS: Bowel or bladder incontinence: No Cauda equina syndrome: No  COGNITION:  Overall cognitive status: Within functional limits for tasks assessed     SENSATION:  Light touch: Appears intact   POSTURE:  Increased lumbar lordosis  PALPATION: TTP to Lt L4-L5 paraspinals/ QL  PASSIVE ACCESSORIES: Hypomobile and painful at L4-L5 CPAs  LUMBAR AROM  AROM AROM  05/29/2021  Flexion 50d with pain and increased Rt hip pain  Extension 10d  Right lateral flexion 32d tightness  Left lateral flexion 28d  Right rotation 40  Left rotation 40   (Blank rows = not tested)    LE MMT:  MMT Right 05/29/2021 Left 05/29/2021  Hip flexion 3/5 3+/5  Hip extension 2+/5 2+/5  Hip abduction 3/5 3/5  Knee flexion 5/5 5/5  Knee extension 5/5 5/5  Ankle dorsiflexion 5/5 5/5  Ankle plantarflexion 5/5 5/5   (Blank rows = not tested)  LUMBAR SPECIAL TESTS:  SLR: (+) on Rt ASLR: (+) on Rt Slump: (-) BIL Repeated lumbar extension: (+), centralization of Rt LE sxs  FUNCTIONAL TESTS:  Plank test: 8.8 seconds      TODAY'S TREATMENT  None performed due to time constraints    PATIENT EDUCATION:  Education details: Pt educated on pathophysiology related to her pain presentation, POC, and prognosis Person educated: Patient Education method: Explanation Education comprehension: verbalized understanding   HOME EXERCISE PROGRAM: Administer at first treatment  ASSESSMENT:  CLINICAL IMPRESSION: Patient is a 46 y.o. F who was seen today for physical therapy evaluation and treatment for Chronic LBP and BIL Rt>Lt LE sxs starting in October of last year. Upon assessment, pt's primary impairments include limited and painful lumbar flexion, weak BIL global hip musculature, weak functional core musculature, hypomobile and painful lumbar passive accessories, and TTP to Lt lumbar paraspinals/  QL. Ruling up lumbar radiculopathy with discogenic causation due to pain with flexion, centralization or pain with repeated extension, positive SLR and ASLR special testing, and recent lumbar MRI results indicative of  L5-S1 disc bulge. Patient will benefit from skilled PT to address above impairments and improve overall function.  REHAB POTENTIAL: Good  CLINICAL DECISION MAKING: Stable/uncomplicated  EVALUATION COMPLEXITY: Low   GOALS: Goals reviewed with patient? Yes  SHORT TERM GOALS:  STG Name Target Date Goal status  1 Pt will report understanding and adherence to her HEP in order to promote independence in the management of her primary impairments. Baseline: HEP to be issued treatment 1 06/26/2021 INITIAL   LONG TERM GOALS:   LTG Name Target Date Goal status  1 Pt will demonstrate 65 degrees of lumbar flexion with 0-2/10 pain in order to put on her shoes without limitation. Baseline: 50 degrees with pain 07/24/2021 INITIAL  2 Pt will achieve BIL global hip strength of 4+/5 or greater in order to stand from a seated position with less limitation. Baseline: See MMT chart 07/24/2021 INITIAL  3 Pt will achieve a functional plank of 40 seconds or longer in order to establish functional core strength necessary to progress her independent strengthening regimen. Baseline: 8.8 seconds 07/24/2021 INITIAL  4 Pt will report 50% improvement in overall symptoms in order to perform housework with less limitation. Baseline: 07/24/2021 INITIAL   PLAN: PT FREQUENCY: 2x/week  PT DURATION: 8 weeks  PLANNED INTERVENTIONS: Therapeutic exercises, Therapeutic activity, Neuro Muscular re-education, Patient/Family education, Joint mobilization, Dry Needling, Spinal mobilization, Cryotherapy, Moist heat, Taping, and Manual therapy  PLAN FOR NEXT SESSION: Administer HEP, introduce early core/ hip strengthening, extension based exercise   Vanessa [place], PT, DPT 05/29/21 7:05 PM

## 2021-05-30 ENCOUNTER — Other Ambulatory Visit: Payer: Self-pay | Admitting: Obstetrics & Gynecology

## 2021-05-30 ENCOUNTER — Encounter: Payer: Self-pay | Admitting: Obstetrics & Gynecology

## 2021-06-06 ENCOUNTER — Other Ambulatory Visit: Payer: Self-pay

## 2021-06-06 ENCOUNTER — Ambulatory Visit: Payer: PRIVATE HEALTH INSURANCE | Attending: Family Medicine

## 2021-06-06 DIAGNOSIS — G8929 Other chronic pain: Secondary | ICD-10-CM | POA: Insufficient documentation

## 2021-06-06 DIAGNOSIS — M5440 Lumbago with sciatica, unspecified side: Secondary | ICD-10-CM | POA: Insufficient documentation

## 2021-06-06 DIAGNOSIS — M6281 Muscle weakness (generalized): Secondary | ICD-10-CM | POA: Insufficient documentation

## 2021-06-06 NOTE — Therapy (Signed)
OUTPATIENT PHYSICAL THERAPY TREATMENT NOTE   Patient Name: Kristin Daniels MRN: [id number] DOB:03-23-1976, 46 y.o., female Today's Date: 06/06/2021  PCP: Lyndee Hensen, DO REFERRING PROVIDER: Lyndee Hensen, DO   PT End of Session - 06/06/21 1129     Visit Number 2    Number of Visits 17    Date for PT Re-Evaluation 07/24/21    Authorization Type [place] [facility]    Authorization Time Period Pending Auth    PT Start Time 1130    PT Stop Time 1209    PT Time Calculation (min) 39 min    Activity Tolerance Patient tolerated treatment well    Behavior During Therapy WFL for tasks assessed/performed             Past Medical History:  Diagnosis Date   Acid reflux    Colon polyp    H. pylori infection    History of seasonal allergies    Hypercholesterolemia    Migraines    Pituitary tumor    Thyroid nodule    Vaginal Pap smear, abnormal    Past Surgical History:  Procedure Laterality Date   CESAREAN SECTION     OTHER SURGICAL HISTORY     hemangio removed at child and had something added to head to help indentation   Patient Active Problem List   Diagnosis Date Noted   Vaginal candidiasis 05/21/2021   Oral candidiasis 05/21/2021   Bilateral leg pain 05/21/2021   Lumbar radiculopathy, acute 04/10/2021   Costochondral chest pain 12/01/2020   Pain of right lower leg 12/01/2020   Abdominal wall fluid collections 12/01/2020   Seasonal allergic rhinitis due to pollen 08/28/2020   Acne 07/26/2020   Physically well but worried 07/26/2020   H. 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   PAIN:  Are you having pain? Yes NPRS scale: 5/10 Pain location: Low back Pain orientation: Right and Left  PAIN TYPE: Tightness Aggravating factors: Standing after prolonged sitting Relieving factors: Heating pad   OBJECTIVE:           LUMBAR AROM   AROM AROM  05/29/2021  Flexion 50d with pain and increased Rt hip pain  Extension 10d  Right lateral flexion 32d tightness  Left lateral flexion 28d  Right rotation 40  Left rotation 40   (Blank rows = not tested)       LE MMT:   MMT Right 05/29/2021 Left 05/29/2021  Hip flexion 3/5 3+/5  Hip extension 2+/5 2+/5  Hip abduction 3/5 3/5  Knee flexion 5/5 5/5  Knee extension 5/5 5/5  Ankle dorsiflexion 5/5 5/5  Ankle plantarflexion 5/5 5/5   (Blank rows = not tested)   FUNCTIONAL TESTS:  Plank test: 8.8 seconds           TODAY'S TREATMENT  [facility] Adult PT Treatment:  DATE: 06/06/2021 Therapeutic Exercise: NuStep lvl 6 UE/LE x 4 min while taking subjective Supine PPT x 10 - 5" Supine 90/90 table top hold 4x20" Bridge 2x10 DKTC x 30" Standing hip abd/ext 3x10 ea STS 3x10 no UE support       PATIENT EDUCATION:  Education details: Pt educated on pathophysiology related to her pain presentation, POC, and prognosis Person educated: Patient Education method: Explanation Education comprehension: verbalized understanding     HOME EXERCISE PROGRAM: Access Code: [email or web address] URL: [email or web address] Date: 06/06/2021 Prepared by: Octavio Manns  Exercises Supine Posterior Pelvic Tilt - 1 x daily - 7 x weekly - 2 sets - 10 reps - 5"  hold Supine 90/90 Abdominal Bracing - 1 x daily - 7 x weekly - 4 reps - 20 sec hold Supine Bridge - 1 x daily - 7 x weekly - 3 sets - 10 reps - 3 sec hold Supine Double Knee to Chest - 1 x daily - 7 x weekly - 2-3 reps - 30 sec hold Cat to Child's Pose with Posterior Pelvic Tilt - 1 x daily - 7 x weekly - 2-3 reps - 30 sec hold Standing Hip Abduction with Resistance at Ankles and Counter Support - 1 x daily - 7 x weekly - 3 sets - 10 reps Standing Hip Extension with Resistance at Ankles and Counter Support - 1 x daily - 7 x weekly - 3 sets - 10 reps    ASSESSMENT:   CLINICAL IMPRESSION: Pt was able to complete all prescribed exercises with no adverse effect. Therapy today focused on improving core and proximal hip strength for decreasing back pain and improving mobility. Pt continues to benefit from skilled PT services in order to improve strength and decrease LBP. Will continue to progress as able.    REHAB POTENTIAL: Good   CLINICAL DECISION MAKING: Stable/uncomplicated   EVALUATION COMPLEXITY: Low     GOALS: Goals reviewed with patient? Yes   SHORT TERM GOALS:   STG Name Target Date Goal status  1 Pt will report understanding and adherence to her HEP in order to promote independence in the management of her primary impairments. Baseline: HEP to be issued treatment 1 06/26/2021 INITIAL    LONG TERM GOALS:    LTG Name Target Date Goal status  1 Pt will demonstrate 65 degrees of lumbar flexion with 0-2/10 pain in order to put on her shoes without limitation. Baseline: 50 degrees with pain 07/24/2021 INITIAL  2 Pt will achieve BIL global hip strength of 4+/5 or greater in order to stand from a seated position with less limitation. Baseline: See MMT chart 07/24/2021 INITIAL  3 Pt will achieve a functional plank of 40 seconds or longer in order to establish functional core strength necessary to progress her independent strengthening regimen. Baseline: 8.8 seconds 07/24/2021 INITIAL  4  Pt will report 50% improvement in overall symptoms in order to perform housework with less limitation. Baseline: 07/24/2021 INITIAL    PLAN: PT FREQUENCY: 2x/week   PT DURATION: 8 weeks   PLANNED INTERVENTIONS: Therapeutic exercises, Therapeutic activity, Neuro Muscular re-education, Patient/Family education, Joint mobilization, Dry Needling, Spinal mobilization, Cryotherapy, Moist heat, Taping, and Manual therapy   PLAN FOR NEXT SESSION: Administer HEP, introduce early core/ hip strengthening, extension based exercise    Ward Chatters, PT 06/06/2021, 12:12 PM

## 2021-06-07 NOTE — Therapy (Incomplete)
OUTPATIENT PHYSICAL THERAPY TREATMENT NOTE   Patient Name: Kristin Daniels MRN: [id number] DOB:[birth year], 46 y.o., female Today's Date: 06/07/2021  PCP: Lyndee Hensen, DO REFERRING PROVIDER: McDiarmid, Blane Ohara, MD     Past Medical History:  Diagnosis Date   Acid reflux    Colon polyp    H. pylori infection    History of seasonal allergies    Hypercholesterolemia    Migraines    Pituitary tumor    Thyroid nodule    Vaginal Pap smear, abnormal    Past Surgical History:  Procedure Laterality Date   CESAREAN SECTION     OTHER SURGICAL HISTORY     hemangio removed at child and had something added to head to help indentation   Patient Active Problem List   Diagnosis Date Noted   Vaginal candidiasis 05/21/2021   Oral candidiasis 05/21/2021   Bilateral leg pain 05/21/2021   Lumbar radiculopathy, acute 04/10/2021   Costochondral chest pain 12/01/2020   Pain of right lower leg 12/01/2020   Abdominal wall fluid collections 12/01/2020   Seasonal allergic rhinitis due to pollen 08/28/2020   Acne 07/26/2020   Physically well but worried 07/26/2020   H. 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  PERTINENT HISTORY: N/A  PRECAUTIONS: None  SUBJECTIVE:  ***  PAIN:  Are you having pain? Yes NPRS scale: 5/10 Pain location: Low back Pain orientation: Right and Left  PAIN TYPE: Tightness Aggravating factors: Standing after prolonged sitting Relieving factors: Heating pad   OBJECTIVE: *Unless otherwise noted,  objective measures collected previously*          LUMBAR AROM   AROM AROM  05/29/2021  Flexion 50d with pain and increased Rt hip pain  Extension 10d  Right lateral flexion 32d tightness  Left lateral flexion 28d  Right rotation 40  Left rotation 40   (Blank rows = not tested)       LE MMT:   MMT Right 05/29/2021 Left 05/29/2021  Hip flexion 3/5 3+/5  Hip extension 2+/5 2+/5  Hip abduction 3/5 3/5  Knee flexion 5/5 5/5  Knee extension 5/5 5/5  Ankle dorsiflexion 5/5 5/5  Ankle plantarflexion 5/5 5/5   (Blank rows = not tested)   FUNCTIONAL TESTS:  Plank test: 8.8 seconds           TODAY'S TREATMENT   [facility] Adult PT Treatment:                                                DATE: 06/08/2021 Therapeutic Exercise: *** Manual Therapy: *** Neuromuscular re-ed: *** Therapeutic Activity: *** Modalities: *** Self Care: [facility] Adult PT Treatment:  DATE: 06/06/2021 Therapeutic Exercise: NuStep lvl 6 UE/LE x 4 min while taking subjective Supine PPT x 10 - 5" Supine 90/90 table top hold 4x20" Bridge 2x10 DKTC x 30" Standing hip abd/ext 3x10 ea STS 3x10 no UE support       PATIENT EDUCATION:  Education details: Pt educated on pathophysiology related to her pain presentation, POC, and prognosis Person educated: Patient Education method: Explanation Education comprehension: verbalized understanding     HOME EXERCISE PROGRAM: Access Code: [email or web address] URL: [email or web address] Date: 06/06/2021 Prepared by: Octavio Manns  Exercises Supine Posterior Pelvic Tilt - 1 x daily - 7 x weekly - 2 sets - 10 reps - 5" hold Supine 90/90 Abdominal Bracing - 1 x daily - 7 x weekly - 4 reps - 20 sec hold Supine Bridge - 1 x daily - 7 x weekly - 3 sets - 10 reps - 3 sec hold Supine Double Knee to Chest - 1 x daily - 7 x weekly - 2-3 reps - 30 sec hold Cat to Child's Pose with Posterior Pelvic Tilt - 1 x daily - 7 x  weekly - 2-3 reps - 30 sec hold Standing Hip Abduction with Resistance at Ankles and Counter Support - 1 x daily - 7 x weekly - 3 sets - 10 reps Standing Hip Extension with Resistance at Ankles and Counter Support - 1 x daily - 7 x weekly - 3 sets - 10 reps    ASSESSMENT:   CLINICAL IMPRESSION: ***   REHAB POTENTIAL: Good   CLINICAL DECISION MAKING: Stable/uncomplicated   EVALUATION COMPLEXITY: Low     GOALS: Goals reviewed with patient? Yes   SHORT TERM GOALS:   STG Name Target Date Goal status  1 Pt will report understanding and adherence to her HEP in order to promote independence in the management of her primary impairments. Baseline: HEP to be issued treatment 1 06/26/2021 INITIAL    LONG TERM GOALS:    LTG Name Target Date Goal status  1 Pt will demonstrate 65 degrees of lumbar flexion with 0-2/10 pain in order to put on her shoes without limitation. Baseline: 50 degrees with pain 07/24/2021 INITIAL  2 Pt will achieve BIL global hip strength of 4+/5 or greater in order to stand from a seated position with less limitation. Baseline: See MMT chart 07/24/2021 INITIAL  3 Pt will achieve a functional plank of 40 seconds or longer in order to establish functional core strength necessary to progress her independent strengthening regimen. Baseline: 8.8 seconds 07/24/2021 INITIAL  4 Pt will report 50% improvement in overall symptoms in order to perform housework with less limitation. Baseline: 07/24/2021 INITIAL    PLAN: PT FREQUENCY: 2x/week   PT DURATION: 8 weeks   PLANNED INTERVENTIONS: Therapeutic exercises, Therapeutic activity, Neuro Muscular re-education, Patient/Family education, Joint mobilization, Dry Needling, Spinal mobilization, Cryotherapy, Moist heat, Taping, and Manual therapy   PLAN FOR NEXT SESSION: Administer HEP, introduce early core/ hip strengthening, extension based exercise    Vanessa Merom, PT, DPT 06/07/21 9:53 AM

## 2021-06-08 ENCOUNTER — Ambulatory Visit: Payer: PRIVATE HEALTH INSURANCE

## 2021-06-11 ENCOUNTER — Other Ambulatory Visit: Payer: Self-pay

## 2021-06-11 ENCOUNTER — Ambulatory Visit: Payer: PRIVATE HEALTH INSURANCE

## 2021-06-11 ENCOUNTER — Other Ambulatory Visit: Payer: Self-pay | Admitting: Obstetrics & Gynecology

## 2021-06-11 DIAGNOSIS — M6281 Muscle weakness (generalized): Secondary | ICD-10-CM

## 2021-06-11 DIAGNOSIS — M5440 Lumbago with sciatica, unspecified side: Secondary | ICD-10-CM

## 2021-06-11 DIAGNOSIS — G8929 Other chronic pain: Secondary | ICD-10-CM

## 2021-06-11 NOTE — Therapy (Signed)
OUTPATIENT PHYSICAL THERAPY TREATMENT NOTE   Patient Name: Kristin Daniels MRN: [id number] DOB:09/25/1975, 46 y.o., female Today's Date: 06/11/2021  PCP: Lyndee Hensen, DO REFERRING PROVIDER: Lyndee Hensen, DO   PT End of Session - 06/11/21 1148     Visit Number 3    Number of Visits 17    Date for PT Re-Evaluation 07/24/21    Authorization Type [place] [facility]    Authorization Time Period 1/26-06/13/21    Authorization - Visit Number 2    Authorization - Number of Visits 3    PT Start Time 4967    PT Stop Time 1230    PT Time Calculation (min) 42 min    Activity Tolerance Patient tolerated treatment well    Behavior During Therapy WFL for tasks assessed/performed              Past Medical History:  Diagnosis Date   Acid reflux    Colon polyp    H. pylori infection    History of seasonal allergies    Hypercholesterolemia    Migraines    Pituitary tumor    Thyroid nodule    Vaginal Pap smear, abnormal    Past Surgical History:  Procedure Laterality Date   CESAREAN SECTION     OTHER SURGICAL HISTORY     hemangio removed at child and had something added to head to help indentation   Patient Active Problem List   Diagnosis Date Noted   Vaginal candidiasis 05/21/2021   Oral candidiasis 05/21/2021   Bilateral leg pain 05/21/2021   Lumbar radiculopathy, acute 04/10/2021   Costochondral chest pain 12/01/2020   Pain of right lower leg 12/01/2020   Abdominal wall fluid collections 12/01/2020   Seasonal allergic rhinitis due to pollen 08/28/2020   Acne 07/26/2020   Physically well but worried 07/26/2020   H. 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 She reports compliance with HEP and feels they are helping with her mobility.   PAIN:  Are you having pain? Yes NPRS scale: 4/10 Pain location: Low back Pain orientation: Right PAIN TYPE: pulling/tightness  Aggravating factors: sudden movements (unable to elaborate on what movements)  Relieving factors: unknown    OBJECTIVE:           LUMBAR AROM   AROM AROM  05/29/2021  Flexion 50d with pain and increased Rt hip pain  Extension 10d  Right lateral flexion 32d tightness  Left lateral flexion 28d  Right rotation 40  Left rotation 40   (Blank rows = not tested)       LE MMT:   MMT Right 05/29/2021 Left 05/29/2021 06/11/21  Hip flexion 3/5 3+/5 4+/5 bilateral   Hip extension 2+/5 2+/5   Hip abduction 3/5 3/5   Knee flexion 5/5 5/5   Knee extension 5/5 5/5   Ankle dorsiflexion 5/5 5/5   Ankle plantarflexion  5/5 5/5    (Blank rows = not tested)   FUNCTIONAL TESTS:  Plank test: 8.8 seconds           TODAY'S TREATMENT  [facility] Adult PT Treatment:                                                DATE: 06/11/21 Therapeutic Exercise: Prone press up on elbows 1 x 10  Standing lumbar extension 1 x 10  SLR with posterior pelvic tilt 2 x 10 bilateral  Hip bridge with adduction ball squeeze 2 x 10; partial range  Clamshell 2 x 10 blue band bilateral  Supine TA march 2 x 10 Resisted shoulder extension 2 x 15 red band  Seated pelvic tilts 2 x 10  Updated HEP    [facility] Adult PT Treatment:                                                DATE: 06/06/2021 Therapeutic Exercise: NuStep lvl 6 UE/LE x 4 min while taking subjective Supine  PPT x 10 - 5" Supine 90/90 table top hold 4x20" Bridge 2x10 DKTC x 30" Standing hip abd/ext 3x10 ea STS 3x10 no UE support       PATIENT EDUCATION:  Education details: see treatment Person educated: patient Education method: demo, verbal cues, handout Education comprehension: returned demo, verbal cues      HOME EXERCISE PROGRAM: Access Code: [email or web address] URL: [email or web address] Date: 06/06/2021 Prepared by: Octavio Manns  Exercises Supine Posterior Pelvic Tilt - 1 x daily - 7 x weekly - 2 sets - 10 reps - 5" hold Supine 90/90 Abdominal Bracing - 1 x daily - 7 x weekly - 4 reps - 20 sec hold Supine Bridge - 1 x daily - 7 x weekly - 3 sets - 10 reps - 3 sec hold Supine Double Knee to Chest - 1 x daily - 7 x weekly - 2-3 reps - 30 sec hold Cat to Child's Pose with Posterior Pelvic Tilt - 1 x daily - 7 x weekly - 2-3 reps - 30 sec hold Standing Hip Abduction with Resistance at Ankles and Counter Support - 1 x daily - 7 x weekly - 3 sets - 10 reps Standing Hip Extension with Resistance at Ankles and Counter Support - 1 x daily - 7 x weekly - 3 sets - 10 reps    ASSESSMENT:   CLINICAL IMPRESSION: Patient arrives with mild Rt-sided low back pain. She responds well to extension biased movement initially reporting a reduction in pulling/tightness in her back. Continued with core stabilization with patient having difficulty maintaining lumbopelvic control with dynamic core stabilization. Her hip flexor strength has much improved compared to baseline. Overall good tolerance to today's session without reports of increased pain.    REHAB POTENTIAL: Good   CLINICAL DECISION MAKING: Stable/uncomplicated   EVALUATION COMPLEXITY: Low     GOALS: Goals reviewed with patient? Yes   SHORT TERM GOALS:   STG Name Target Date Goal status  1 Pt will report understanding and adherence to her HEP in order to promote independence in the management of her primary  impairments. Baseline: HEP to be issued treatment 1 06/26/2021 INITIAL    LONG TERM GOALS:  LTG Name Target Date Goal status  1 Pt will demonstrate 65 degrees of lumbar flexion with 0-2/10 pain in order to put on her shoes without limitation. Baseline: 50 degrees with pain 07/24/2021 INITIAL  2 Pt will achieve BIL global hip strength of 4+/5 or greater in order to stand from a seated position with less limitation. Baseline: See MMT chart 07/24/2021 INITIAL  3 Pt will achieve a functional plank of 40 seconds or longer in order to establish functional core strength necessary to progress her independent strengthening regimen. Baseline: 8.8 seconds 07/24/2021 INITIAL  4 Pt will report 50% improvement in overall symptoms in order to perform housework with less limitation. Baseline: 07/24/2021 INITIAL    PLAN: PT FREQUENCY: 2x/week   PT DURATION: 8 weeks   PLANNED INTERVENTIONS: Therapeutic exercises, Therapeutic activity, Neuro Muscular re-education, Patient/Family education, Joint mobilization, Dry Needling, Spinal mobilization, Cryotherapy, Moist heat, Taping, and Manual therapy   PLAN FOR NEXT SESSION:  progress note to request additional auth; introduce early core/ hip strengthening, extension based exercise   Gwendolyn Grant, PT, DPT, ATC 06/11/21 12:32 PM

## 2021-06-12 NOTE — Therapy (Signed)
OUTPATIENT PHYSICAL THERAPY TREATMENT NOTE  Progress Note Reporting Period 05/29/21 to 06/13/21  See note below for Objective Data and Assessment of Progress/Goals.     Patient Name: Kristin Daniels MRN: [id number] DOB:Feb 18, 1976, 46 y.o., female Today's Date: 06/13/2021  PCP: Lyndee Hensen, DO REFERRING PROVIDER: McDiarmid, Blane Ohara, MD   PT End of Session - 06/13/21 1152     Visit Number 4    Number of Visits 17    Date for PT Re-Evaluation 07/24/21    Authorization Type [place] [employer]    Authorization Time Period 1/26-06/13/21    Authorization - Visit Number 3    Authorization - Number of Visits 3    PT Start Time 1152   patient late   PT Stop Time 73    PT Time Calculation (min) 38 min    Activity Tolerance Patient tolerated treatment well    Behavior During Therapy WFL for tasks assessed/performed               Past Medical History:  Diagnosis Date   Acid reflux    Colon polyp    H. pylori infection    History of seasonal allergies    Hypercholesterolemia    Migraines    Pituitary tumor    Thyroid nodule    Vaginal Pap smear, abnormal    Past Surgical History:  Procedure Laterality Date   CESAREAN SECTION     OTHER SURGICAL HISTORY     hemangio removed at child and had something added to head to help indentation   Patient Active Problem List   Diagnosis Date Noted   Vaginal candidiasis 05/21/2021   Oral candidiasis 05/21/2021   Bilateral leg pain 05/21/2021   Lumbar radiculopathy, acute 04/10/2021   Costochondral chest pain 12/01/2020   Pain of right lower leg 12/01/2020   Abdominal wall fluid collections 12/01/2020   Seasonal allergic rhinitis due to pollen 08/28/2020   Acne 07/26/2020   Physically well but worried 07/26/2020   H. 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 "The muscles felt weak." She reports this pain was similar to when she first began to have pain. It lasted for about 2-3 hours. She reports the back is still sore and tight. Patient reports subjective overall improvement of 5%.   PAIN:  Are you having pain? Yes NPRS scale: 6/10 Pain location: Low back Pain orientation: midline  PAIN TYPE: pulling/tightness  Aggravating factors: sitting Relieving factors: elevating legs     OBJECTIVE:   *Unless otherwise noted all objective measures were captured on initial evaluation.          LUMBAR AROM   AROM AROM  05/29/2021 06/13/21  Flexion 50d with pain and increased Rt hip pain 70 degrees with increased low back pain  Extension 10d   Right lateral flexion 32d tightness   Left lateral flexion 28d   Right rotation 40  Left rotation 40    (Blank rows = not tested)       LE MMT:   MMT Right 05/29/2021 Left 05/29/2021 06/11/21 06/13/21  Hip flexion 3/5 3+/5 4+/5 bilateral    Hip extension 2+/5 2+/5  3+/5 bilaterally  Hip abduction 3/5 3/5  Rt: 3+/5 Lt: 4-/5  Knee flexion 5/5 5/5    Knee extension 5/5 5/5    Ankle dorsiflexion 5/5 5/5    Ankle plantarflexion 5/5 5/5     (Blank rows = not tested)   FUNCTIONAL TESTS:  Plank test: 8.8 seconds 06/13/21: 10 seconds            TODAY'S TREATMENT  [facility] Adult PT Treatment:                                                DATE: 06/13/21 Therapeutic Exercise: Prone pressup on elbows 1 x 10  Prone hip extension 2 x 10  bilateral  Fire hydrant 2 x 10 bilateral  Sidelying hip circles 2 x 10 bilateral  Supine TA march 2 x 10  90/90 march 2 x 10  Modified 100s 3 x 30   [facility] Adult PT Treatment:                                                DATE: 06/11/21 Therapeutic Exercise: Prone press up on elbows 1 x 10  Standing lumbar extension 1 x 10  SLR with posterior pelvic tilt 2 x 10 bilateral  Hip bridge with adduction ball squeeze 2 x 10; partial range  Clamshell 2 x 10 blue band bilateral  Supine TA march 2 x 10 Resisted shoulder extension 2 x 15 red band  Seated pelvic tilts 2 x 10  Updated HEP    [facility] Adult PT Treatment:                                                DATE: 06/06/2021 Therapeutic Exercise: NuStep lvl 6 UE/LE x 4 min while taking subjective Supine PPT x 10 - 5" Supine 90/90 table top hold 4x20" Bridge 2x10 DKTC x 30" Standing hip abd/ext 3x10 ea STS 3x10 no UE support       PATIENT EDUCATION:  Education details: see treatment Person educated: patient Education method: demo, verbal cues, handout Education comprehension: returned demo, verbal cues      HOME EXERCISE PROGRAM: Access Code: [email or web address] URL: [email or web address] Date: 06/06/2021 Prepared by: Octavio Manns  Exercises Supine Posterior Pelvic Tilt - 1 x daily - 7 x weekly - 2 sets - 10 reps - 5" hold Supine 90/90 Abdominal Bracing - 1 x daily - 7 x weekly - 4 reps - 20 sec hold Supine Bridge - 1 x daily - 7 x weekly - 3 sets - 10 reps - 3 sec hold Supine Double Knee to Chest - 1 x daily - 7 x weekly - 2-3 reps - 30 sec hold Cat to Child's Pose with Posterior Pelvic Tilt - 1 x daily - 7 x weekly - 2-3 reps - 30 sec hold Standing Hip Abduction with Resistance  at Ankles and Counter Support - 1 x daily - 7 x weekly - 3 sets - 10 reps Standing Hip Extension with Resistance at Ankles and Counter Support - 1 x daily - 7 x weekly - 3 sets - 10 reps    ASSESSMENT:   CLINICAL IMPRESSION: Kristin Daniels has  completed 3 treatment sessions since the start of care making gradual progress towards functional goals. She has met her short term functional goal and partially met 2/4 long term functional goals. She demonstrates improved trunk flexion AROM, though this continues to exacerbate her pain. Her hip flexor strength has improved, though notable weakness remains in hip abductors and extensors and core. She will benefit from continuing with current POC in order to address ongoing strength and ROM deficits and continued back/BLE pain in order to optimize her function.    REHAB POTENTIAL: Good   CLINICAL DECISION MAKING: Stable/uncomplicated   EVALUATION COMPLEXITY: Low     GOALS: Goals reviewed with patient? Yes   SHORT TERM GOALS:   STG Name Target Date Goal status  1 Pt will report understanding and adherence to her HEP in order to promote independence in the management of her primary impairments. Baseline: HEP to be issued treatment 1 06/26/2021 Achieved     LONG TERM GOALS:    LTG Name Target Date Goal status  1 Pt will demonstrate 65 degrees of lumbar flexion with 0-2/10 pain in order to put on her shoes without limitation. Baseline: 50 degrees with pain; 06/13/21 70 degrees pain 6/10  07/24/2021 Partially met   2 Pt will achieve BIL global hip strength of 4+/5 or greater in order to stand from a seated position with less limitation. Baseline: See MMT chart 07/24/2021 Partially met   3 Pt will achieve a functional plank of 40 seconds or longer in order to establish functional core strength necessary to progress her independent strengthening regimen. Baseline: 8.8 seconds; 06/13/21 10 seconds  07/24/2021 Ongoing   4 Pt will report 50% improvement in overall symptoms in order to perform housework with less limitation. Baseline: 06/13/21 5% subjective improvement  07/24/2021 Ongoing     PLAN: PT FREQUENCY: 2x/week   PT DURATION: 8 weeks   PLANNED INTERVENTIONS: Therapeutic exercises, Therapeutic  activity, Neuro Muscular re-education, Patient/Family education, Joint mobilization, Dry Needling, Spinal mobilization, Cryotherapy, Moist heat, Taping, and Manual therapy   PLAN FOR NEXT SESSION:   introduce early core/ hip strengthening, extension based exercise   Gwendolyn Grant, PT, DPT, ATC 06/13/21 3:37 PM

## 2021-06-13 ENCOUNTER — Ambulatory Visit: Payer: PRIVATE HEALTH INSURANCE

## 2021-06-13 ENCOUNTER — Other Ambulatory Visit: Payer: Self-pay

## 2021-06-13 DIAGNOSIS — G8929 Other chronic pain: Secondary | ICD-10-CM

## 2021-06-13 DIAGNOSIS — M6281 Muscle weakness (generalized): Secondary | ICD-10-CM

## 2021-06-13 DIAGNOSIS — M5440 Lumbago with sciatica, unspecified side: Secondary | ICD-10-CM | POA: Diagnosis not present

## 2021-06-18 ENCOUNTER — Ambulatory Visit: Payer: PRIVATE HEALTH INSURANCE

## 2021-06-20 ENCOUNTER — Other Ambulatory Visit: Payer: Medicaid Other

## 2021-06-21 NOTE — Therapy (Signed)
OUTPATIENT PHYSICAL THERAPY TREATMENT NOTE     Patient Name: Kristin Daniels MRN: [id number] DOB:[birth year], 46 y.o., female Today's Date: 06/25/2021  PCP: Lyndee Hensen, DO REFERRING PROVIDER: McDiarmid, Blane Ohara, MD   PT End of Session - 06/25/21 1116     Visit Number 6    Number of Visits 17    Date for PT Re-Evaluation 07/24/21    Authorization Type [place] MCD    Authorization Time Period 2/10-3/23    Authorization - Visit Number 2    Authorization - Number of Visits 12    PT Start Time 1117   patient late   PT Stop Time 1146    PT Time Calculation (min) 29 min    Activity Tolerance Patient tolerated treatment well    Behavior During Therapy WFL for tasks assessed/performed                Past Medical History:  Diagnosis Date   Acid reflux    Colon polyp    H. pylori infection    History of seasonal allergies    Hypercholesterolemia    Migraines    Pituitary tumor    Thyroid nodule    Vaginal Pap smear, abnormal    Past Surgical History:  Procedure Laterality Date   CESAREAN SECTION     OTHER SURGICAL HISTORY     hemangio removed at child and had something added to head to help indentation   Patient Active Problem List   Diagnosis Date Noted   Vaginal candidiasis 05/21/2021   Oral candidiasis 05/21/2021   Bilateral leg pain 05/21/2021   Lumbar radiculopathy, acute 04/10/2021   Costochondral chest pain 12/01/2020   Pain of right lower leg 12/01/2020   Abdominal wall fluid collections 12/01/2020   Seasonal allergic rhinitis due to pollen 08/28/2020   Acne 07/26/2020   Physically well but worried 07/26/2020   H. 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   History of pituitary tumor 04/21/2017   History of thyroid nodule 04/21/2017   Enlarged thyroid gland 03/14/2017   Fibrocystic breast changes, bilateral 03/14/2017   Benign tumor of pituitary  gland ([place]) 03/14/2017   Uterine leiomyoma 03/14/2017    REFERRING DIAG: M54.17 (ICD-10-CM) - Lumbosacral radiculopathy at L5  THERAPY DIAG:  Chronic bilateral low back pain with sciatica, sciatica laterality unspecified  Muscle weakness (generalized)  PERTINENT HISTORY: N/A  PRECAUTIONS: None  SUBJECTIVE:  Patient reports she is a little sore in her core and hips because she has been trying to workout in addition to her HEP.    PAIN:  Are you having pain? No NPRS scale: 0/10 NPRS Worst scale: 7/10  Pain location: Low back Pain orientation: midline  PAIN TYPE: pulling/tightness  Aggravating factors: bending, reaching  Relieving factors: elevating legs     OBJECTIVE:   *Unless otherwise noted all objective measures were captured on initial evaluation.          LUMBAR AROM   AROM AROM  05/29/2021 06/13/21  Flexion 50d with pain and increased Rt hip pain 70 degrees with increased low back pain  Extension 10d   Right lateral flexion 32d tightness   Left lateral flexion 28d   Right rotation 40   Left rotation 40    (Blank rows = not tested)       LE MMT:   MMT Right 05/29/2021 Left 05/29/2021 06/11/21 06/13/21  Hip flexion 3/5 3+/5  4+/5 bilateral    Hip extension 2+/5 2+/5  3+/5 bilaterally  Hip abduction 3/5 3/5  Rt: 3+/5 Lt: 4-/5  Knee flexion 5/5 5/5    Knee extension 5/5 5/5    Ankle dorsiflexion 5/5 5/5    Ankle plantarflexion 5/5 5/5     (Blank rows = not tested)   FUNCTIONAL TESTS:  Plank test: 8.8 seconds 06/13/21: 10 seconds  06/25/21: 30 seconds            TODAY'S TREATMENT  [facility] Adult PT Treatment:                                                DATE: 06/25/21 Therapeutic Exercise: Prone pressup 1 x 10 partial range  Quadruped leg extension 2 x 10  Side plank 2 x 20 sec bilateral  Dead bug 2 x 10  SL bridge partial range 2 x 10 Prone quad stretch 60 sec each  Plank on elbows 2 x 30 sec    [facility] Adult PT Treatment:                                                 DATE: 06/22/2021 Therapeutic Exercise: Seated Rt sciatic nerve glide 2x20 Seated low rows with 25# cable 2x10 Seated high rows with 25# cable 2x10 Standing Pallof press with 7# cable 2x10 with 5-sec hold BIL Standing open book stretch 2x10 BIL Traditional dead lift with two 10# kettlebells 3x8 Manual Therapy: Effleurage to BIL mid/ upper traps/ lumbar paraspinals in prone  [facility] Adult PT Treatment:                                                DATE: 06/13/21 Therapeutic Exercise: Prone pressup on elbows 1 x 10  Prone hip extension 2 x 10 bilateral  Fire hydrant 2 x 10 bilateral  Sidelying hip circles 2 x 10 bilateral  Supine TA march 2 x 10  90/90 march 2 x 10  Modified 100s 3 x 30   [facility] Adult PT Treatment:                                                DATE: 06/11/21 Therapeutic Exercise: Prone press up on elbows 1 x 10  Standing lumbar extension 1 x 10  SLR with posterior pelvic tilt 2 x 10 bilateral  Hip bridge with adduction ball squeeze 2 x 10; partial range  Clamshell 2 x 10 blue band bilateral  Supine TA march 2 x 10 Resisted shoulder extension 2 x 15 red band  Seated pelvic tilts 2 x 10  Updated HEP       PATIENT EDUCATION:  Education details: N/A Person educated: N/A Education method: N/A Education comprehension:N/A     HOME EXERCISE PROGRAM: Access Code: [email or web address] URL: [email or web address] Date: 06/06/2021 Prepared by: Octavio Manns  Exercises Supine Posterior Pelvic Tilt - 1 x daily - 7 x weekly - 2 sets - 10 reps - 5"  hold Supine 90/90 Abdominal Bracing - 1 x daily - 7 x weekly - 4 reps - 20 sec hold Supine Bridge - 1 x daily - 7 x weekly - 3 sets - 10 reps - 3 sec hold Supine Double Knee to Chest - 1 x daily - 7 x weekly - 2-3 reps - 30 sec hold Cat to Child's Pose with Posterior Pelvic Tilt - 1 x daily - 7 x weekly - 2-3 reps - 30 sec hold Standing Hip Abduction with Resistance at Ankles and Counter Support - 1 x daily - 7 x  weekly - 3 sets - 10 reps Standing Hip Extension with Resistance at Ankles and Counter Support - 1 x daily - 7 x weekly - 3 sets - 10 reps    ASSESSMENT:   CLINICAL IMPRESSION: Session limited as patient was late for scheduled appointment. She arrives without reports of low back or radicular pain. Able to progress quadruped core stabilization with patient initially having difficulty maintaining lumbopelvic stability in this position, though with continued practice she demonstrates improved control. Plank hold time has significantly improved compared to last assessment with patient able to achieve and maintain appropriate positioning without cueing for 30 seconds.    REHAB POTENTIAL: Good   CLINICAL DECISION MAKING: Stable/uncomplicated   EVALUATION COMPLEXITY: Low     GOALS: Goals reviewed with patient? Yes   SHORT TERM GOALS:   STG Name Target Date Goal status  1 Pt will report understanding and adherence to her HEP in order to promote independence in the management of her primary impairments. Baseline: HEP to be issued treatment 1 06/26/2021 Achieved     LONG TERM GOALS:    LTG Name Target Date Goal status  1 Pt will demonstrate 65 degrees of lumbar flexion with 0-2/10 pain in order to put on her shoes without limitation. Baseline: 50 degrees with pain; 06/13/21 70 degrees pain 6/10  07/24/2021 Partially met   2 Pt will achieve BIL global hip strength of 4+/5 or greater in order to stand from a seated position with less limitation. Baseline: See MMT chart 07/24/2021 Partially met   3 Pt will achieve a functional plank of 40 seconds or longer in order to establish functional core strength necessary to progress her independent strengthening regimen. Baseline: 8.8 seconds; 06/13/21 10 seconds  07/24/2021 Ongoing   4 Pt will report 50% improvement in overall symptoms in order to perform housework with less limitation. Baseline: 06/13/21 5% subjective improvement  07/24/2021 Ongoing      PLAN: PT FREQUENCY: 2x/week   PT DURATION: 8 weeks   PLANNED INTERVENTIONS: Therapeutic exercises, Therapeutic activity, Neuro Muscular re-education, Patient/Family education, Joint mobilization, Dry Needling, Spinal mobilization, Cryotherapy, Moist heat, Taping, and Manual therapy   PLAN FOR NEXT SESSION:   introduce early core/ hip strengthening, extension based exercise   Gwendolyn Grant, PT, DPT, ATC 06/25/21 11:47 AM

## 2021-06-21 NOTE — Therapy (Signed)
OUTPATIENT PHYSICAL THERAPY TREATMENT NOTE  Progress Note Reporting Period 05/29/21 to 06/13/21  See note below for Objective Data and Assessment of Progress/Goals.     Patient Name: Kristin Daniels MRN: [id number] DOB:25-Mar-1976, 46 y.o., female Today's Date: 06/22/2021  PCP: Lyndee Hensen, DO REFERRING PROVIDER: Lyndee Hensen, DO   PT End of Session - 06/22/21 1138     Visit Number 5    Number of Visits 17    Date for PT Re-Evaluation 07/24/21    Authorization Type [place] MCD    Authorization Time Period Pending additional Auth    PT Start Time 1137   Pt arrived 10 minutes early.   PT Stop Time 1215    PT Time Calculation (min) 38 min    Activity Tolerance Patient tolerated treatment well    Behavior During Therapy WFL for tasks assessed/performed                Past Medical History:  Diagnosis Date   Acid reflux    Colon polyp    H. pylori infection    History of seasonal allergies    Hypercholesterolemia    Migraines    Pituitary tumor    Thyroid nodule    Vaginal Pap smear, abnormal    Past Surgical History:  Procedure Laterality Date   CESAREAN SECTION     OTHER SURGICAL HISTORY     hemangio removed at child and had something added to head to help indentation   Patient Active Problem List   Diagnosis Date Noted   Vaginal candidiasis 05/21/2021   Oral candidiasis 05/21/2021   Bilateral leg pain 05/21/2021   Lumbar radiculopathy, acute 04/10/2021   Costochondral chest pain 12/01/2020   Pain of right lower leg 12/01/2020   Abdominal wall fluid collections 12/01/2020   Seasonal allergic rhinitis due to pollen 08/28/2020   Acne 07/26/2020   Physically well but worried 07/26/2020   H. 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 She reports daily adherence to her HEP. She also reports 8/10 mid-back/ upper thoracic/ lower cervical pain.  PAIN:  Are you having pain? Yes NPRS scale: 6-8/10 Pain location: Low back, mid back, lower cervical spine Pain orientation: midline  PAIN TYPE: pulling/tightness  Aggravating factors: sitting Relieving factors: elevating legs     OBJECTIVE:   *Unless otherwise noted all objective measures were captured on initial evaluation.          LUMBAR AROM   AROM AROM  05/29/2021 06/13/21  Flexion 50d with pain and increased Rt hip pain 70 degrees with increased low back pain  Extension 10d   Right lateral flexion 32d tightness   Left lateral flexion 28d   Right rotation 40   Left rotation 40    (Blank rows = not tested)       LE MMT:   MMT Right 05/29/2021 Left 05/29/2021 06/11/21  06/13/21  Hip flexion 3/5 3+/5 4+/5 bilateral    Hip extension 2+/5 2+/5  3+/5 bilaterally  Hip abduction 3/5 3/5  Rt: 3+/5 Lt: 4-/5  Knee flexion 5/5 5/5    Knee extension 5/5 5/5    Ankle dorsiflexion 5/5 5/5    Ankle plantarflexion 5/5 5/5     (Blank rows = not tested)   FUNCTIONAL TESTS:  Plank test: 8.8 seconds 06/13/21: 10 seconds            TODAY'S TREATMENT   [facility] Adult PT Treatment:                                                DATE: 06/22/2021 Therapeutic Exercise: Seated Rt sciatic nerve glide 2x20 Seated low rows with 25# cable 2x10 Seated high rows with 25# cable 2x10 Standing Pallof press with 7# cable 2x10 with 5-sec hold  BIL Standing open book stretch 2x10 BIL Traditional dead lift with two 10# kettlebells 3x8 Manual Therapy: Effleurage to BIL mid/ upper traps/ lumbar paraspinals in prone Neuromuscular re-ed: N/A Therapeutic Activity: N/A Modalities: N/A Self Care: N/A   [facility] Adult PT Treatment:                                                DATE: 06/13/21 Therapeutic Exercise: Prone pressup on elbows 1 x 10  Prone hip extension 2 x 10 bilateral  Fire hydrant 2 x 10 bilateral  Sidelying hip circles 2 x 10 bilateral  Supine TA march 2 x 10  90/90 march 2 x 10  Modified 100s 3 x 30   [facility] Adult PT Treatment:                                                DATE: 06/11/21 Therapeutic Exercise: Prone press up on elbows 1 x 10  Standing lumbar extension 1 x 10  SLR with posterior pelvic tilt 2 x 10 bilateral  Hip bridge with adduction ball squeeze 2 x 10; partial range  Clamshell 2 x 10 blue band bilateral  Supine TA march 2 x 10 Resisted shoulder extension 2 x 15 red band  Seated pelvic tilts 2 x 10  Updated HEP          PATIENT EDUCATION:  Education details: see treatment Person educated: patient Education method: demo, verbal cues, handout Education comprehension: returned demo, verbal cues      HOME EXERCISE PROGRAM: Access Code: [email or web address] URL: [email or web address] Date: 06/06/2021 Prepared by: Octavio Manns  Exercises Supine Posterior Pelvic Tilt - 1 x daily - 7 x weekly - 2 sets - 10 reps - 5" hold Supine 90/90 Abdominal Bracing - 1 x daily - 7 x weekly - 4 reps - 20 sec hold Supine Bridge - 1 x daily - 7 x weekly - 3 sets - 10 reps - 3 sec hold Supine Double Knee to Chest - 1 x daily - 7 x weekly - 2-3 reps - 30 sec hold Cat to Child's Pose with Posterior Pelvic Tilt - 1 x daily - 7 x weekly - 2-3 reps - 30  sec hold Standing Hip Abduction with Resistance at Ankles and Counter Support - 1 x daily - 7 x weekly - 3 sets - 10 reps Standing Hip Extension with  Resistance at Ankles and Counter Support - 1 x daily - 7 x weekly - 3 sets - 10 reps    ASSESSMENT:   CLINICAL IMPRESSION: Due to pt, arriving 7 minutes late today, treatment session was truncated. She responded well to manual treatment today, reporting decrease in pain to 5/10 following manual techniques. She also tolerated therapeutic exercises well. She Daniels continue to benefit from skilled PT to address her primary impairments and return to her prior level of function with less limitation.   REHAB POTENTIAL: Good   CLINICAL DECISION MAKING: Stable/uncomplicated   EVALUATION COMPLEXITY: Low     GOALS: Goals reviewed with patient? Yes   SHORT TERM GOALS:   STG Name Target Date Goal status  1 Pt Daniels report understanding and adherence to her HEP in order to promote independence in the management of her primary impairments. Baseline: HEP to be issued treatment 1 06/26/2021 Achieved     LONG TERM GOALS:    LTG Name Target Date Goal status  1 Pt Daniels demonstrate 65 degrees of lumbar flexion with 0-2/10 pain in order to put on her shoes without limitation. Baseline: 50 degrees with pain; 06/13/21 70 degrees pain 6/10  07/24/2021 Partially met   2 Pt Daniels achieve BIL global hip strength of 4+/5 or greater in order to stand from a seated position with less limitation. Baseline: See MMT chart 07/24/2021 Partially met   3 Pt Daniels achieve a functional plank of 40 seconds or longer in order to establish functional core strength necessary to progress her independent strengthening regimen. Baseline: 8.8 seconds; 06/13/21 10 seconds  07/24/2021 Ongoing   4 Pt Daniels report 50% improvement in overall symptoms in order to perform housework with less limitation. Baseline: 06/13/21 5% subjective improvement  07/24/2021 Ongoing     PLAN: PT FREQUENCY: 2x/week   PT DURATION: 8 weeks   PLANNED INTERVENTIONS: Therapeutic exercises, Therapeutic activity, Neuro Muscular re-education, Patient/Family education,  Joint mobilization, Dry Needling, Spinal mobilization, Cryotherapy, Moist heat, Taping, and Manual therapy   PLAN FOR NEXT SESSION:   introduce early core/ hip strengthening, extension based exercise   Vanessa [place], PT, DPT 06/22/21 12:15 PM

## 2021-06-22 ENCOUNTER — Ambulatory Visit: Payer: PRIVATE HEALTH INSURANCE

## 2021-06-22 ENCOUNTER — Other Ambulatory Visit: Payer: Self-pay

## 2021-06-22 DIAGNOSIS — G8929 Other chronic pain: Secondary | ICD-10-CM

## 2021-06-22 DIAGNOSIS — M5440 Lumbago with sciatica, unspecified side: Secondary | ICD-10-CM | POA: Diagnosis not present

## 2021-06-22 DIAGNOSIS — M6281 Muscle weakness (generalized): Secondary | ICD-10-CM

## 2021-06-25 ENCOUNTER — Ambulatory Visit: Payer: PRIVATE HEALTH INSURANCE

## 2021-06-25 ENCOUNTER — Other Ambulatory Visit: Payer: Self-pay

## 2021-06-25 DIAGNOSIS — G8929 Other chronic pain: Secondary | ICD-10-CM

## 2021-06-25 DIAGNOSIS — M6281 Muscle weakness (generalized): Secondary | ICD-10-CM

## 2021-06-25 DIAGNOSIS — M5440 Lumbago with sciatica, unspecified side: Secondary | ICD-10-CM | POA: Diagnosis not present

## 2021-06-26 NOTE — Therapy (Incomplete)
OUTPATIENT PHYSICAL THERAPY TREATMENT NOTE     Patient Name: Kristin Daniels MRN: [id number] DOB:06/12/1975, 46 y.o., female Today's Date: 06/26/2021  PCP: Lyndee Hensen, DO REFERRING PROVIDER: McDiarmid, Blane Ohara, MD        Past Medical History:  Diagnosis Date   Acid reflux    Colon polyp    H. pylori infection    History of seasonal allergies    Hypercholesterolemia    Migraines    Pituitary tumor    Thyroid nodule    Vaginal Pap smear, abnormal    Past Surgical History:  Procedure Laterality Date   CESAREAN SECTION     OTHER SURGICAL HISTORY     hemangio removed at child and had something added to head to help indentation   Patient Active Problem List   Diagnosis Date Noted   Vaginal candidiasis 05/21/2021   Oral candidiasis 05/21/2021   Bilateral leg pain 05/21/2021   Lumbar radiculopathy, acute 04/10/2021   Costochondral chest pain 12/01/2020   Pain of right lower leg 12/01/2020   Abdominal wall fluid collections 12/01/2020   Seasonal allergic rhinitis due to pollen 08/28/2020   Acne 07/26/2020   Physically well but worried 07/26/2020   H. 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  PERTINENT HISTORY: N/A  PRECAUTIONS: None  SUBJECTIVE:     PAIN:  Are you having pain? No NPRS scale: 0/10 NPRS Worst scale: 7/10  Pain location: Low back Pain orientation: midline  PAIN TYPE: pulling/tightness  Aggravating factors: bending, reaching  Relieving factors: elevating legs     OBJECTIVE:    *Unless otherwise noted all objective measures were captured on initial evaluation.          LUMBAR AROM   AROM AROM  05/29/2021 06/13/21  Flexion 50d with pain and increased Rt hip pain 70 degrees with increased low back pain  Extension 10d   Right lateral flexion 32d tightness   Left lateral flexion 28d   Right rotation 40   Left rotation 40    (Blank rows = not tested)       LE MMT:   MMT Right 05/29/2021 Left 05/29/2021 06/11/21 06/13/21  Hip flexion 3/5 3+/5 4+/5 bilateral    Hip extension 2+/5 2+/5  3+/5 bilaterally  Hip abduction 3/5 3/5  Rt: 3+/5 Lt: 4-/5  Knee flexion 5/5 5/5    Knee extension 5/5 5/5    Ankle dorsiflexion 5/5 5/5    Ankle plantarflexion 5/5 5/5     (Blank rows = not tested)   FUNCTIONAL TESTS:  Plank test: 8.8 seconds 06/13/21: 10 seconds  06/25/21: 30 seconds            TODAY'S TREATMENT  [facility] Adult PT Treatment:                                                DATE: 06/27/21 Therapeutic Exercise: *** Manual Therapy: *** Neuromuscular re-ed: *** Therapeutic  Activity: *** Modalities: *** Self Care: ***   [facility] Adult PT Treatment:                                                DATE: 06/25/21 Therapeutic Exercise: Prone pressup 1 x 10 partial range  Quadruped leg extension 2 x 10  Side plank 2 x 20 sec bilateral  Dead bug 2 x 10  SL bridge partial range 2 x 10 Prone quad stretch 60 sec each  Plank on elbows 2 x 30 sec    [facility] Adult PT Treatment:                                                DATE: 06/22/2021 Therapeutic Exercise: Seated Rt sciatic nerve glide 2x20 Seated low rows with 25# cable 2x10 Seated high rows with 25# cable 2x10 Standing Pallof press with 7# cable 2x10 with 5-sec hold BIL Standing open book stretch 2x10 BIL Traditional dead lift with two 10# kettlebells 3x8 Manual Therapy: Effleurage to BIL mid/ upper traps/ lumbar paraspinals in prone  [facility] Adult PT Treatment:                                                 DATE: 06/13/21 Therapeutic Exercise: Prone pressup on elbows 1 x 10  Prone hip extension 2 x 10 bilateral  Fire hydrant 2 x 10 bilateral  Sidelying hip circles 2 x 10 bilateral  Supine TA march 2 x 10  90/90 march 2 x 10  Modified 100s 3 x 30        PATIENT EDUCATION:  Education details: N/A Person educated: N/A Education method: N/A Education comprehension:N/A     HOME EXERCISE PROGRAM: Access Code: [email or web address] URL: [email or web address] Date: 06/06/2021 Prepared by: Octavio Manns  Exercises Supine Posterior Pelvic Tilt - 1 x daily - 7 x weekly - 2 sets - 10 reps - 5" hold Supine 90/90 Abdominal Bracing - 1 x daily - 7 x weekly - 4 reps - 20 sec hold Supine Bridge - 1 x daily - 7 x weekly - 3 sets - 10 reps - 3 sec hold Supine Double Knee to Chest - 1 x daily - 7 x weekly - 2-3 reps - 30 sec hold Cat to Child's Pose with Posterior Pelvic Tilt - 1 x daily - 7 x weekly - 2-3 reps - 30 sec hold Standing Hip Abduction with Resistance at Ankles and Counter Support - 1 x daily - 7 x weekly - 3 sets - 10 reps Standing Hip Extension with Resistance at Ankles and Counter Support - 1 x daily - 7 x weekly - 3 sets - 10 reps    ASSESSMENT:   CLINICAL IMPRESSION:    REHAB POTENTIAL: Good   CLINICAL DECISION MAKING: Stable/uncomplicated   EVALUATION COMPLEXITY: Low     GOALS: Goals reviewed with patient? Yes   SHORT TERM GOALS:   STG Name Target Date Goal status  1 Pt will report understanding and adherence to her HEP in order to promote independence in the management of her primary impairments. Baseline: HEP  to be issued treatment 1 06/26/2021 Achieved     LONG TERM GOALS:    LTG Name Target Date Goal status  1 Pt will demonstrate 65 degrees of lumbar flexion with 0-2/10 pain in order to put on her shoes without limitation. Baseline: 50 degrees with pain; 06/13/21 70 degrees pain 6/10  07/24/2021 Partially met   2 Pt will achieve BIL global hip strength of 4+/5 or  greater in order to stand from a seated position with less limitation. Baseline: See MMT chart 07/24/2021 Partially met   3 Pt will achieve a functional plank of 40 seconds or longer in order to establish functional core strength necessary to progress her independent strengthening regimen. Baseline: 8.8 seconds; 06/13/21 10 seconds  07/24/2021 Ongoing   4 Pt will report 50% improvement in overall symptoms in order to perform housework with less limitation. Baseline: 06/13/21 5% subjective improvement  07/24/2021 Ongoing     PLAN: PT FREQUENCY: 2x/week   PT DURATION: 8 weeks   PLANNED INTERVENTIONS: Therapeutic exercises, Therapeutic activity, Neuro Muscular re-education, Patient/Family education, Joint mobilization, Dry Needling, Spinal mobilization, Cryotherapy, Moist heat, Taping, and Manual therapy   PLAN FOR NEXT SESSION:   introduce early core/ hip strengthening, extension based exercise   Gwendolyn Grant, PT, DPT, ATC 06/26/21 9:12 AM

## 2021-06-27 ENCOUNTER — Other Ambulatory Visit: Payer: Self-pay

## 2021-06-27 ENCOUNTER — Ambulatory Visit: Payer: PRIVATE HEALTH INSURANCE

## 2021-06-27 DIAGNOSIS — G8929 Other chronic pain: Secondary | ICD-10-CM

## 2021-06-27 DIAGNOSIS — M5440 Lumbago with sciatica, unspecified side: Secondary | ICD-10-CM

## 2021-06-27 DIAGNOSIS — M6281 Muscle weakness (generalized): Secondary | ICD-10-CM

## 2021-06-27 NOTE — Therapy (Signed)
OUTPATIENT PHYSICAL THERAPY TREATMENT NOTE     Patient Name: Kristin Daniels MRN: [id number] DOB:February 15, 1976, 46 y.o., female Today's Date: 06/27/2021  PCP: Lyndee Hensen, DO REFERRING PROVIDER: McDiarmid, Blane Ohara, MD   PT End of Session - 06/27/21 1143     Visit Number 7    Number of Visits 17    Date for PT Re-Evaluation 07/24/21    Authorization Type [place] [employer]    Authorization Time Period 2/10-3/23    Authorization - Visit Number 3    Authorization - Number of Visits 12    PT Start Time 1143    PT Stop Time 1226    PT Time Calculation (min) 43 min    Activity Tolerance Patient tolerated treatment well    Behavior During Therapy WFL for tasks assessed/performed                 Past Medical History:  Diagnosis Date   Acid reflux    Colon polyp    H. pylori infection    History of seasonal allergies    Hypercholesterolemia    Migraines    Pituitary tumor    Thyroid nodule    Vaginal Pap smear, abnormal    Past Surgical History:  Procedure Laterality Date   CESAREAN SECTION     OTHER SURGICAL HISTORY     hemangio removed at child and had something added to head to help indentation   Patient Active Problem List   Diagnosis Date Noted   Vaginal candidiasis 05/21/2021   Oral candidiasis 05/21/2021   Bilateral leg pain 05/21/2021   Lumbar radiculopathy, acute 04/10/2021   Costochondral chest pain 12/01/2020   Pain of right lower leg 12/01/2020   Abdominal wall fluid collections 12/01/2020   Seasonal allergic rhinitis due to pollen 08/28/2020   Acne 07/26/2020   Physically well but worried 07/26/2020   H. 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    PAIN:  Are you having pain? No NPRS scale: 6/10 NPRS Worst scale: 7/10  Pain location: Low back and posterior Rt hip PAIN TYPE: pulling/tightness  Aggravating factors: bending, reaching  Relieving factors: elevating legs     OBJECTIVE:   *Unless otherwise noted all objective measures were captured on initial evaluation.          LUMBAR AROM   AROM AROM  05/29/2021 06/13/21  Flexion 50d with pain and increased Rt hip pain 70 degrees with increased low back pain  Extension 10d   Right lateral flexion 32d tightness   Left lateral flexion 28d   Right rotation 40   Left rotation 40    (Blank rows = not tested)       LE MMT:   MMT Right 05/29/2021 Left 05/29/2021 06/11/21 06/13/21  Hip flexion 3/5 3+/5 4+/5 bilateral    Hip extension 2+/5 2+/5  3+/5 bilaterally  Hip abduction 3/5 3/5  Rt: 3+/5 Lt: 4-/5  Knee flexion 5/5 5/5    Knee extension 5/5 5/5    Ankle dorsiflexion 5/5 5/5    Ankle plantarflexion 5/5 5/5     (Blank rows = not tested)   FUNCTIONAL TESTS:  Plank test: 8.8 seconds 06/13/21: 10 seconds  06/25/21: 30 seconds            TODAY'S TREATMENT  [facility] Adult PT Treatment:                                                DATE: 06/27/21 Therapeutic Exercise: Recumbent bike x 5 minutes; level 4  Prone pressup 2 x 10 partial range  Bird dog 2 x 10  Fire hydrant 2 x 10 green band  Donkey kicks 2 x 10 4# Marching on stability ball 2 x 10  Resisted shoulder extension while seated on stability ball 2 x 15 red band  Pallof press black band 2 x 10  Sidelying leg taps fwd/bwd 2 x 10      [facility] Adult PT Treatment:                                                DATE: 06/25/21 Therapeutic Exercise: Prone pressup 1 x 10 partial range  Quadruped leg extension 2 x 10  Side plank 2 x 20 sec bilateral  Dead bug 2 x 10  SL bridge partial range 2 x 10 Prone quad stretch 60 sec each  Plank on elbows 2 x 30 sec    [facility] Adult PT Treatment:                                                DATE: 06/22/2021 Therapeutic Exercise: Seated Rt sciatic nerve glide 2x20 Seated low rows with 25# cable 2x10 Seated high rows with 25# cable 2x10 Standing Pallof press with 7# cable 2x10 with 5-sec hold BIL Standing open book stretch 2x10 BIL Traditional dead lift with two 10# kettlebells 3x8 Manual Therapy: Effleurage to BIL mid/ upper traps/ lumbar paraspinals in prone       PATIENT EDUCATION:  Education details: N/A Person educated: N/A Education method: N/A Education comprehension:N/A     HOME EXERCISE PROGRAM: Access Code: [email or web address] URL: [email or web address] Date: 06/06/2021 Prepared by: Octavio Manns  Exercises Supine Posterior Pelvic Tilt - 1 x daily - 7 x weekly - 2 sets - 10 reps - 5" hold Supine 90/90 Abdominal Bracing - 1 x daily - 7 x weekly - 4 reps - 20 sec hold Supine Bridge - 1 x daily - 7 x weekly - 3 sets - 10 reps - 3 sec hold Supine Double Knee to Chest - 1 x daily - 7 x weekly - 2-3 reps - 30 sec hold Cat to Child's Pose with Posterior Pelvic Tilt - 1 x daily - 7 x weekly - 2-3 reps - 30 sec hold Standing Hip Abduction with Resistance at Ankles and Counter Support - 1 x daily - 7 x weekly - 3 sets - 10 reps Standing Hip Extension with Resistance at  Ankles and Counter Support - 1 x daily - 7 x weekly - 3 sets - 10 reps    ASSESSMENT:   CLINICAL IMPRESSION: Patient tolerated session well today with continued progression of dynamic core stabilization and hip strengthening. Lumbopelvic control is improving with quadruped strengthening, though  continues to require verbal and tactile cues initially to decrease pelvic rotation. She reported Rt lateral hip discomfort following donkey kicks that was relieved with extension biased movement.    REHAB POTENTIAL: Good   CLINICAL DECISION MAKING: Stable/uncomplicated   EVALUATION COMPLEXITY: Low     GOALS: Goals reviewed with patient? Yes   SHORT TERM GOALS:   STG Name Target Date Goal status  1 Pt will report understanding and adherence to her HEP in order to promote independence in the management of her primary impairments. Baseline: HEP to be issued treatment 1 06/26/2021 Achieved     LONG TERM GOALS:    LTG Name Target Date Goal status  1 Pt will demonstrate 65 degrees of lumbar flexion with 0-2/10 pain in order to put on her shoes without limitation. Baseline: 50 degrees with pain; 06/13/21 70 degrees pain 6/10  07/24/2021 Partially met   2 Pt will achieve BIL global hip strength of 4+/5 or greater in order to stand from a seated position with less limitation. Baseline: See MMT chart 07/24/2021 Partially met   3 Pt will achieve a functional plank of 40 seconds or longer in order to establish functional core strength necessary to progress her independent strengthening regimen. Baseline: 8.8 seconds; 06/13/21 10 seconds  07/24/2021 Ongoing   4 Pt will report 50% improvement in overall symptoms in order to perform housework with less limitation. Baseline: 06/13/21 5% subjective improvement  07/24/2021 Ongoing     PLAN: PT FREQUENCY: 2x/week   PT DURATION: 8 weeks   PLANNED INTERVENTIONS: Therapeutic exercises, Therapeutic activity, Neuro Muscular re-education, Patient/Family education, Joint mobilization, Dry Needling, Spinal mobilization, Cryotherapy, Moist heat, Taping, and Manual therapy   PLAN FOR NEXT SESSION:   update HEP, introduce early core/ hip strengthening, extension based exercise   Gwendolyn Grant, PT, DPT, ATC 06/27/21 12:27 PM

## 2021-06-29 NOTE — Progress Notes (Signed)
NEUROLOGY FOLLOW UP OFFICE NOTE  Kristin Daniels [id number]  Assessment/Plan:   Migraine without aura, without status migrainosus, not intractable Pituitary microadenoma   Migraine prevention:  Nortriptyline to 100mg  at bedtime.  Migraine rescue:  sumatriptan 20mg  NS Keep headache diary Repeat MRI brain/pituitary w/wo in 6 months Follow up 6 months.   Subjective:  Kristin Daniels is a 46 year old female with pituitary tumor (on cabergoline) and migraines who follows up for migraines.   UPDATE:   Increased nortriptyline in August. Never tried Nurtec as needed Intensity:  moderate Duration:  2 days Frequency:  once a week to every 2 weeks   Current NSAIDS:  naproxen 550mg  Current analgesics:  none Current triptans:  none Current ergotamine:  none Current anti-emetic:  none Current muscle relaxants:  none Current anti-anxiolytic:  none Current sleep aide:  none Current Antihypertensive medications:  none Current Antidepressant medications:  nortriptyline 100mg  at bedtime Current Anticonvulsant medications:  none Current anti-CGRP:  none Current Vitamins/Herbal/Supplements:  Riboflavin-magnesium-feverfew 100-90-25mg  Current Antihistamines/Decongestants:  Flonase Other therapy:  none Hormone/birth control:  None Other medications:  cabergoline   Caffeine:  No coffee.   Diet:  Ginger ale.  Drinks a lot of water. Exercise:  no Depression:  no; Anxiety:  no Other pain:  no.  Seen in ED on 04/23/2021 for low back pain radiating down right leg with numbness and weakness where MRI of lumbar spine revealed right L5-S1 paracentral disc extrusion impinging on the right L5 nerve root.  She was referred to neurosurgery Sleep hygiene:  Inconsistent.  6-7 hours but often wakes up throughout the night.   HISTORY:  She has had headaches since her early 43s, at which time she was diagnosed with a pituitary adenoma.  Headaches are described as moderate to severe non-throbbing  pressure involving top and front of head or left temporal region.  There is associated nausea, photophobia, phonophobia and scalp tenderness.  No associated vomiting, visual disturbance or numbness or weakness.  No specific triggers.  Rest helps relieve it.  They typically have occurred about 3 times a month.  However, they have been near daily since January 2020, lasting several hours up to all day.  She does not know why.  She has been treating headaches with analgesics (Advil and Tylenol) about 4 days a week.     She takes cabergoline for her pituitary adenoma and is followed by endocrinology.  Thyroid and prolactin levels have been stable.  She had an eye exam which was unremarkable.    03/18/2016 MRI PITUITARY W WO:  posterior central left-sided pituitary adenoma, likely proteinaceous, 7 mm x 4 mm x 4.5 mm extending to the insertion of the infundibulum which is slightly deviated toward the right, and mild remodeling of the sella floor.  05/31/2019 MRI BRAIN & PITUITARY W WO: two nonspecific punctate hyperintense foci in the right cerebral white matter (unremarkable) as well as 8 mmm pituitary lesion which may be a microadenoma vs Rathke's cleft cyst.   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  She underwent reconstructive surgery in 2010.  PAST MEDICAL HISTORY: Past Medical History:  Diagnosis Date   Acid reflux    Colon polyp    H. pylori infection    History of seasonal  allergies    Hypercholesterolemia    Migraines    Pituitary tumor    Thyroid nodule    Vaginal Pap smear, abnormal     MEDICATIONS: Current Outpatient Medications on File Prior to Visit  Medication Sig Dispense Refill   ALPRAZolam (XANAX) 1 MG tablet Take 1 mg by mouth daily.     ARIPiprazole (ABILIFY) 30 MG tablet Take 30 mg by mouth daily.     cabergoline (DOSTINEX) 0.5 MG tablet Take 1 Tab on Sundays and half tablet on Thursdays 18 tablet 1   cetirizine (ZYRTEC) 10 MG tablet Take 1 tablet (10 mg total) by mouth daily. 30 tablet 11   clindamycin (CLEOCIN-T) 1 % external solution Apply topically 2 (two) times daily as needed. 30 mL 2   cyclobenzaprine (FLEXERIL) 5 MG tablet Take 1 tablet (5 mg total) by mouth 3 (three) times daily as needed for muscle spasms. 30 tablet 0   desonide (DESOWEN) 0.05 % cream Apply topically 2 (two) times daily. 30 g 0   FIBER PO Take by mouth.     fluticasone (FLONASE) 50 MCG/ACT nasal spray Place 2 sprays into both nostrils daily. 16 g 6   gabapentin (NEURONTIN) 100 MG capsule Take 2 capsules (200 mg total) by mouth 3 (three) times daily. 180 capsule 0   hydrocortisone 2.5 % cream Apply topically 2 (two) times daily. 90 g 1   MAGNESIUM PO Take by mouth.     Miconazole Nitrate Applicator 370 & 2 MG-% (9GM) KIT Place 100 mg vaginally at bedtime. 1 kit 2   Multiple Vitamin (MULTIVITAMIN) tablet Take 1 tablet by mouth daily.     naproxen sodium (ANAPROX DS) 550 MG tablet Take 1 tablet (550 mg total) by mouth 2 (two) times daily as needed. 20 tablet 5   nortriptyline (PAMELOR) 50 MG capsule Take 2 capsules (100 mg total) by mouth at bedtime. 60 capsule 5   nystatin (MYCOSTATIN) 100000 UNIT/ML suspension Take 5 mLs (500,000 Units total) by mouth 4 (four) times daily. 60 mL 0   Olopatadine HCl 0.2 % SOLN Apply 1 drop to eye daily. 1 Bottle 0   Omega-3 Fatty Acids (FISH OIL PO) Take by mouth.     omeprazole (PRILOSEC) 40 MG capsule Take 1 capsule (40 mg total) by  mouth daily. 30 capsule 0   Plecanatide (TRULANCE) 3 MG TABS Take 1 tablet by mouth daily. 30 tablet 11   Riboflavin-Magnesium-Feverfew 100-90-25 MG TABS Take 100 mg by mouth daily. 30 tablet 0   terconazole (TERAZOL 7) 0.4 % vaginal cream Place 1 applicator vaginally at bedtime. 45 g 0   ZINC SULFATE-VITAMIN C MT Use as directed in the mouth or throat.     zolpidem (AMBIEN) 10 MG tablet Take 10 mg by mouth at bedtime as needed.     No current facility-administered medications on file prior to visit.    ALLERGIES: Allergies  Allergen Reactions   Metronidazole Anaphylaxis and Hives   Diflucan [Fluconazole] Rash    Broke out in a rash with hives around her mouth, hands, face.    FAMILY HISTORY: Family History  Problem Relation Age of Onset   Hypertension Mother    Hypercholesterolemia Mother    Colon polyps Maternal Grandmother    Colon  cancer Neg Hx    Esophageal cancer Neg Hx    Stomach cancer Neg Hx    Rectal cancer Neg Hx       Objective:  Blood pressure 116/78, pulse 96, height $RemoveBe'5\' 2"'zAuuOcPep$  (1.575 m), weight 172 lb (78 kg), SpO2 96 %. General: No acute distress.  Patient appears well-groomed.   Head:  Normocephalic/atraumatic Eyes:  Fundi examined but not visualized Neck: supple, no paraspinal tenderness, full range of motion Heart:  Regular rate and rhythm Lungs:  Clear to auscultation bilaterally Back: No paraspinal tenderness Neurological Exam: alert and oriented to person, place, and time.  Speech fluent and not dysarthric, language intact.  CN II-XII intact. Bulk and tone normal, muscle strength 5/5 throughout.  Sensation to light touch intact.  Deep tendon reflexes 2+ throughout, toes downgoing.  Finger to nose testing intact.  Gait normal, Romberg negative.   Metta Clines, DO  CC: Lyndee Hensen, DO

## 2021-06-30 NOTE — Therapy (Signed)
OUTPATIENT PHYSICAL THERAPY TREATMENT NOTE     Patient Name: Philippa Vessey MRN: [id number] DOB:1976-03-30, 46 y.o., female Today's Date: 07/03/2021  PCP: Katha Cabal, DO REFERRING PROVIDER: McDiarmid, Leighton Roach, MD   PT End of Session - 07/03/21 1706     Visit Number 8    Number of Visits 17    Date for PT Re-Evaluation 07/24/21    Authorization Type [place] [facility]    Authorization Time Period 2/10-3/23    Authorization - Visit Number 4    Authorization - Number of Visits 12    PT Start Time 1705    PT Stop Time 1745    PT Time Calculation (min) 40 min    Activity Tolerance Patient tolerated treatment well    Behavior During Therapy WFL for tasks assessed/performed                  Past Medical History:  Diagnosis Date   Acid reflux    Colon polyp    H. pylori infection    History of seasonal allergies    Hypercholesterolemia    Migraines    Pituitary tumor    Thyroid nodule    Vaginal Pap smear, abnormal    Past Surgical History:  Procedure Laterality Date   CESAREAN SECTION     OTHER SURGICAL HISTORY     hemangio removed at child and had something added to head to help indentation   Patient Active Problem List   Diagnosis Date Noted   Vaginal candidiasis 05/21/2021   Oral candidiasis 05/21/2021   Bilateral leg pain 05/21/2021   Lumbar radiculopathy, acute 04/10/2021   Costochondral chest pain 12/01/2020   Pain of right lower leg 12/01/2020   Abdominal wall fluid collections 12/01/2020   Seasonal allergic rhinitis due to pollen 08/28/2020   Acne 07/26/2020   Physically well but worried 07/26/2020   H. 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   History of pituitary tumor 04/21/2017   History of thyroid nodule 04/21/2017   Enlarged thyroid gland 03/14/2017   Fibrocystic breast changes, bilateral 03/14/2017   Benign tumor of pituitary gland ([facility])  03/14/2017   Uterine leiomyoma 03/14/2017    REFERRING DIAG: M54.17 (ICD-10-CM) - Lumbosacral radiculopathy at L5  THERAPY DIAG:  Chronic bilateral low back pain with sciatica, sciatica laterality unspecified  Muscle weakness (generalized)  PERTINENT HISTORY: N/A  PRECAUTIONS: None  SUBJECTIVE:  Pt reports increased stiffness and achy pain in her low back today. She reports doing her HEP about every other day.   PAIN:  Are you having pain? No NPRS scale: 6/10 NPRS Worst scale: 7/10  Pain location: Low back and posterior Rt hip PAIN TYPE: pulling/tightness  Aggravating factors: bending, reaching  Relieving factors: elevating legs     OBJECTIVE:   *Unless otherwise noted all objective measures were captured on initial evaluation.          LUMBAR AROM   AROM AROM  05/29/2021 06/13/21  Flexion 50d with pain and increased Rt hip pain 70 degrees with increased low back pain  Extension 10d   Right lateral flexion 32d tightness   Left lateral flexion 28d   Right rotation 40   Left rotation 40    (Blank rows = not tested)       LE MMT:   MMT Right 05/29/2021 Left 05/29/2021 06/11/21 06/13/21 07/03/2021  Hip flexion 3/5 3+/5 4+/5 bilateral  4+/5 BIL  Hip extension 2+/5 2+/5  3+/5 bilaterally 3+/5 on Lt; 3/5 on Rt  Hip abduction 3/5 3/5  Rt: 3+/5 Lt: 4-/5 3+/5Lt; 4/5 Rt  Knee flexion 5/5 5/5     Knee extension 5/5 5/5     Ankle dorsiflexion 5/5 5/5     Ankle plantarflexion 5/5 5/5      (Blank rows = not tested)   FUNCTIONAL TESTS:  Plank test: 8.8 seconds 06/13/21: 10 seconds  06/25/21: 30 seconds  07/03/2021: 42 seconds           TODAY'S TREATMENT   [facility] Adult PT Treatment:                                                DATE: 07/03/2021 Therapeutic Exercise: Standing Pallof press with 10# cable 2x10 with 5-sec hold BIL Standing abdominal press-down with 30# cable 3x10 15# kettlebell swing 3x20 Standing Cybex hip abduction with 30# cable 2x10 BIL Standing Cybex  hip extension with 30# cable 2x10 BIL Seated low rows with 25# cable 2x10 Seated high rows with 25# cable 2x10 Seated lat pull-down with 25# cable 2x10 Seated shoulder rolls 2x10 forward and backward Manual Therapy: Effleurage and tapotement to BIL lumbar paraspinals Neuromuscular re-ed: N/A Therapeutic Activity: N/A Modalities: N/A Self Care: N/A  [facility] Adult PT Treatment:                                                DATE: 06/27/21 Therapeutic Exercise: Recumbent bike x 5 minutes; level 4  Prone pressup 2 x 10 partial range  Bird dog 2 x 10  Fire hydrant 2 x 10 green band  Donkey kicks 2 x 10 4# Marching on stability ball 2 x 10  Resisted shoulder extension while seated on stability ball 2 x 15 red band  Pallof press black band 2 x 10  Sidelying leg taps fwd/bwd 2 x 10     [facility] Adult PT Treatment:                                                DATE: 06/25/21 Therapeutic Exercise: Prone pressup 1 x 10 partial range  Quadruped leg extension 2 x 10  Side plank 2 x 20 sec bilateral  Dead bug 2 x 10  SL bridge partial range 2 x 10 Prone quad stretch 60 sec each  Plank on elbows 2 x 30 sec        PATIENT EDUCATION:  Education details: N/A Person educated: N/A Education method: N/A Education comprehension:N/A     HOME EXERCISE PROGRAM: Access Code: [email or web address] URL: [email or web address] Date: 06/06/2021 Prepared by: Octavio Manns  Exercises Supine Posterior Pelvic Tilt - 1 x daily - 7 x weekly - 2 sets - 10 reps - 5" hold Supine 90/90 Abdominal Bracing - 1 x daily - 7 x weekly - 4 reps - 20 sec hold Supine Bridge - 1 x daily - 7 x weekly - 3 sets - 10 reps - 3 sec hold Supine Double Knee to Chest - 1 x daily - 7 x weekly - 2-3  reps - 30 sec hold Cat to Child's Pose with Posterior Pelvic Tilt - 1 x daily - 7 x weekly - 2-3 reps - 30 sec hold Standing Hip Abduction with Resistance at Ankles and Counter Support - 1 x daily - 7 x weekly - 3 sets - 10  reps Standing Hip Extension with Resistance at Ankles and Counter Support - 1 x daily - 7 x weekly - 3 sets - 10 reps    ASSESSMENT:   CLINICAL IMPRESSION: Pt responded well to all interventions today, demonstrating good form and no increase in pain with selected exercises. Due to heightened baseline pain, treatment started with manual techniques, and the pt reports a therapeutic response to this treatment. Upon re-assessment of LE strength, the pt remains limited in her global hip strength, although she continues to make good progress in her functional core strength as indicated by continued improvement in plank time. She will continue to benefit from skilled PT to address her primary impairments and return to her prior level of function with less limitation.   REHAB POTENTIAL: Good   CLINICAL DECISION MAKING: Stable/uncomplicated   EVALUATION COMPLEXITY: Low     GOALS: Goals reviewed with patient? Yes   SHORT TERM GOALS:   STG Name Target Date Goal status  1 Pt will report understanding and adherence to her HEP in order to promote independence in the management of her primary impairments. Baseline: HEP to be issued treatment 1 06/26/2021 Achieved     LONG TERM GOALS:    LTG Name Target Date Goal status  1 Pt will demonstrate 65 degrees of lumbar flexion with 0-2/10 pain in order to put on her shoes without limitation. Baseline: 50 degrees with pain; 06/13/21 70 degrees pain 6/10  07/24/2021 Partially met   2 Pt will achieve BIL global hip strength of 4+/5 or greater in order to stand from a seated position with less limitation. Baseline: See MMT chart 07/24/2021 Partially met   3 Pt will achieve a functional plank of 40 seconds or longer in order to establish functional core strength necessary to progress her independent strengthening regimen. Baseline: 8.8 seconds; 06/13/21 10 seconds  07/24/2021 Ongoing   4 Pt will report 50% improvement in overall symptoms in order to perform housework  with less limitation. Baseline: 06/13/21 5% subjective improvement  07/24/2021 Ongoing     PLAN: PT FREQUENCY: 2x/week   PT DURATION: 8 weeks   PLANNED INTERVENTIONS: Therapeutic exercises, Therapeutic activity, Neuro Muscular re-education, Patient/Family education, Joint mobilization, Dry Needling, Spinal mobilization, Cryotherapy, Moist heat, Taping, and Manual therapy   PLAN FOR NEXT SESSION:   update HEP, introduce early core/ hip strengthening, extension based exercise   Vanessa [place], PT, DPT 07/03/21 5:49 PM

## 2021-07-02 ENCOUNTER — Ambulatory Visit (INDEPENDENT_AMBULATORY_CARE_PROVIDER_SITE_OTHER): Payer: PRIVATE HEALTH INSURANCE | Admitting: Neurology

## 2021-07-02 ENCOUNTER — Other Ambulatory Visit: Payer: Self-pay

## 2021-07-02 ENCOUNTER — Encounter: Payer: Self-pay | Admitting: Neurology

## 2021-07-02 VITALS — BP 116/78 | HR 96 | Ht 62.0 in | Wt 172.0 lb

## 2021-07-02 DIAGNOSIS — G43009 Migraine without aura, not intractable, without status migrainosus: Secondary | ICD-10-CM

## 2021-07-02 DIAGNOSIS — D352 Benign neoplasm of pituitary gland: Secondary | ICD-10-CM

## 2021-07-02 MED ORDER — SUMATRIPTAN 20 MG/ACT NA SOLN: NASAL | 5 refills | Status: DC

## 2021-07-02 MED ORDER — NORTRIPTYLINE HCL 50 MG PO CAPS: 100.0000 mg | ORAL_CAPSULE | Freq: Every day | ORAL | 5 refills | Status: DC

## 2021-07-02 NOTE — Patient Instructions (Addendum)
Continue nortriptyline 100mg  at bedtime At earliest onset of migraine, take sumatriptan spray - 1 spray in one nostril.  May repeat after 2 hours.  Maximum 2 sprays in 24 hours. Repeat MRI of brain/pituitary with and without contrast in 6 months Follow up after repeat MRI

## 2021-07-03 ENCOUNTER — Ambulatory Visit: Payer: PRIVATE HEALTH INSURANCE

## 2021-07-03 DIAGNOSIS — G8929 Other chronic pain: Secondary | ICD-10-CM

## 2021-07-03 DIAGNOSIS — M5440 Lumbago with sciatica, unspecified side: Secondary | ICD-10-CM | POA: Diagnosis not present

## 2021-07-03 DIAGNOSIS — M6281 Muscle weakness (generalized): Secondary | ICD-10-CM

## 2021-07-04 ENCOUNTER — Ambulatory Visit: Payer: PRIVATE HEALTH INSURANCE | Admitting: Family Medicine

## 2021-07-05 ENCOUNTER — Other Ambulatory Visit: Payer: Self-pay

## 2021-07-05 ENCOUNTER — Ambulatory Visit
Admission: RE | Admit: 2021-07-05 | Discharge: 2021-07-05 | Disposition: A | Discharge status: Home / Self Care | Payer: PRIVATE HEALTH INSURANCE | Source: Ambulatory Visit | Attending: Obstetrics & Gynecology | Admitting: Obstetrics & Gynecology

## 2021-07-05 ENCOUNTER — Encounter: Payer: Self-pay | Admitting: Obstetrics & Gynecology

## 2021-07-05 DIAGNOSIS — R102 Pelvic and perineal pain: Secondary | ICD-10-CM | POA: Insufficient documentation

## 2021-07-05 NOTE — Therapy (Signed)
OUTPATIENT PHYSICAL THERAPY TREATMENT NOTE     Patient Name: Mikel Pyon MRN: [id number] DOB:Sep 03, 1975, 46 y.o., female Today's Date: 07/06/2021  PCP: Lyndee Hensen, DO REFERRING PROVIDER: Lyndee Hensen, DO   PT End of Session - 07/06/21 1304     Visit Number 9    Number of Visits 17    Date for PT Re-Evaluation 07/24/21    Authorization Type [place] [facility]CD    Authorization Time Period 2/10-3/23    Authorization - Visit Number 5    Authorization - Number of Visits 12    PT Start Time 8676    PT Stop Time 7209    PT Time Calculation (min) 40 min    Activity Tolerance Patient tolerated treatment well    Behavior During Therapy WFL for tasks assessed/performed                   Past Medical History:  Diagnosis Date   Acid reflux    Colon polyp    H. pylori infection    History of seasonal allergies    Hypercholesterolemia    Migraines    Pituitary tumor    Thyroid nodule    Vaginal Pap smear, abnormal    Past Surgical History:  Procedure Laterality Date   CESAREAN SECTION     OTHER SURGICAL HISTORY     hemangio removed at child and had something added to head to help indentation   Patient Active Problem List   Diagnosis Date Noted   Vaginal candidiasis 05/21/2021   Oral candidiasis 05/21/2021   Bilateral leg pain 05/21/2021   Lumbar radiculopathy, acute 04/10/2021   Costochondral chest pain 12/01/2020   Pain of right lower leg 12/01/2020   Abdominal wall fluid collections 12/01/2020   Seasonal allergic rhinitis due to pollen 08/28/2020   Acne 07/26/2020   Physically well but worried 07/26/2020   H. 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   PAIN:  Are you having pain? No NPRS scale: 6/10 NPRS Worst scale: 7/10  Pain location: Low back and posterior Rt hip PAIN TYPE: pulling/tightness  Aggravating factors: bending, reaching  Relieving factors: elevating legs     OBJECTIVE:   *Unless otherwise noted all objective measures were captured on initial evaluation.          LUMBAR AROM   AROM AROM  05/29/2021 06/13/21  Flexion 50d with pain and increased Rt hip pain 70 degrees with increased low back pain  Extension 10d   Right lateral flexion 32d tightness   Left lateral flexion 28d   Right rotation 40   Left rotation 40    (Blank rows = not tested)       LE MMT:   MMT Right 05/29/2021 Left 05/29/2021 06/11/21 06/13/21 07/03/2021  Hip flexion 3/5 3+/5 4+/5 bilateral   4+/5 BIL  Hip extension 2+/5  2+/5  3+/5 bilaterally 3+/5 on Lt; 3/5 on Rt  Hip abduction 3/5 3/5  Rt: 3+/5 Lt: 4-/5 3+/5Lt; 4/5 Rt  Knee flexion 5/5 5/5     Knee extension 5/5 5/5     Ankle dorsiflexion 5/5 5/5     Ankle plantarflexion 5/5 5/5      (Blank rows = not tested)   FUNCTIONAL TESTS:  Plank test: 8.8 seconds 06/13/21: 10 seconds  06/25/21: 30 seconds  07/03/2021: 42 seconds           TODAY'S TREATMENT   [facility] Adult PT Treatment:                                                DATE: 07/06/2021 Therapeutic Exercise: Supine bicycles with YTB around feet 3x20 Supine DKTC 3x30sec Side knee plank with hip abduction 2x10 BIL Standard plank x3 to exhaustion Prone swimmers 3x20 Standing cross-body knee drive with thigh attachment and 7# cable at  [employer] machine 3x15 BIL Standing diagonal chop with wand 2x10 BIL with 13# cable Standing windmill stretch 2x10 BIL Seated low rows with 30# cable 2x10 Seated high rows with 30# cable 2x10 Seated lat pull-down with 30# cable 2x10 Seated forward/ backward shoulder rolls 2x10 each Standing trunk extension 2x10 with 5-sec hold Manual Therapy: N/A Neuromuscular re-ed: N/A Therapeutic Activity: N/A Modalities: N/A Self Care: N/A   [facility] Adult PT Treatment:                                                DATE: 07/03/2021 Therapeutic Exercise: Standing Pallof press with 10# cable 2x10 with 5-sec hold BIL Standing abdominal press-down with 30# cable 3x10 15# kettlebell swing 3x20 Standing Cybex hip abduction with 30# cable 2x10 BIL Standing Cybex hip extension with 30# cable 2x10 BIL Seated low rows with 25# cable 2x10 Seated high rows with 25# cable 2x10 Seated lat pull-down with 25# cable 2x10 Seated shoulder rolls 2x10 forward and backward Manual Therapy: Effleurage and tapotement to BIL lumbar paraspinals Neuromuscular re-ed: N/A Therapeutic Activity: N/A Modalities: N/A Self Care: N/A  [facility] Adult PT Treatment:                                                DATE: 06/27/21 Therapeutic Exercise: Recumbent bike x 5 minutes; level 4  Prone pressup 2 x 10 partial range  Bird dog 2 x 10  Fire hydrant 2 x 10 green band  Donkey kicks 2 x 10 4# Marching on stability ball 2 x 10  Resisted shoulder extension while seated on stability ball 2 x 15 red band  Pallof press black band 2 x 10  Sidelying leg taps fwd/bwd 2 x 10         PATIENT EDUCATION:  Education details: Updated HEP Person educated: Patient Education method: Verbal, demonstration, handout Education comprehension: verbal and returned demonstration     HOME EXERCISE PROGRAM: Access Code: [email or web address] URL: [email or web address] Date: 06/06/2021 Prepared by: Octavio Manns  Exercises Supine  Posterior Pelvic Tilt - 1 x daily - 7 x weekly - 2 sets - 10 reps -  5" hold Supine 90/90 Abdominal Bracing - 1 x daily - 7 x weekly - 4 reps - 20 sec hold Supine Bridge - 1 x daily - 7 x weekly - 3 sets - 10 reps - 3 sec hold Supine Double Knee to Chest - 1 x daily - 7 x weekly - 2-3 reps - 30 sec hold Cat to Child's Pose with Posterior Pelvic Tilt - 1 x daily - 7 x weekly - 2-3 reps - 30 sec hold Standing Hip Abduction with Resistance at Ankles and Counter Support - 1 x daily - 7 x weekly - 3 sets - 10 reps Standing Hip Extension with Resistance at Ankles and Counter Support - 1 x daily - 7 x weekly - 3 sets - 10 reps  Added 07/06/2021: Modified Side Plank with Hip Abduction - 1 x daily - 7 x weekly - 2 sets - 10 reps Standard Plank - 1 x daily - 7 x weekly - 3 sets Prone Swimmer - 1 x daily - 7 x weekly - 3 sets - 20 reps    ASSESSMENT:   CLINICAL IMPRESSION: Pt responded well to progressed core and hip strengthening exercises today, reporting no increase in pain, only increased fatigue with performed interventions. She will continue to benefit from skilled PT to address her primary impairments and return to her prior level of function with less limitation.   REHAB POTENTIAL: Good   CLINICAL DECISION MAKING: Stable/uncomplicated   EVALUATION COMPLEXITY: Low     GOALS: Goals reviewed with patient? Yes   SHORT TERM GOALS:   STG Name Target Date Goal status  1 Pt will report understanding and adherence to her HEP in order to promote independence in the management of her primary impairments. Baseline: HEP to be issued treatment 1 06/26/2021 Achieved     LONG TERM GOALS:    LTG Name Target Date Goal status  1 Pt will demonstrate 65 degrees of lumbar flexion with 0-2/10 pain in order to put on her shoes without limitation. Baseline: 50 degrees with pain; 06/13/21 70 degrees pain 6/10  07/24/2021 Partially met   2 Pt will achieve BIL global hip strength of 4+/5 or greater in order to  stand from a seated position with less limitation. Baseline: See MMT chart 07/24/2021 Partially met   3 Pt will achieve a functional plank of 40 seconds or longer in order to establish functional core strength necessary to progress her independent strengthening regimen. Baseline: 8.8 seconds; 06/13/21 10 seconds; 07/03/2021: 42 seconds 07/24/2021 ACHIEVED  4 Pt will report 50% improvement in overall symptoms in order to perform housework with less limitation. Baseline: 06/13/21 5% subjective improvement  07/24/2021 Ongoing     PLAN: PT FREQUENCY: 2x/week   PT DURATION: 8 weeks   PLANNED INTERVENTIONS: Therapeutic exercises, Therapeutic activity, Neuro Muscular re-education, Patient/Family education, Joint mobilization, Dry Needling, Spinal mobilization, Cryotherapy, Moist heat, Taping, and Manual therapy   PLAN FOR NEXT SESSION:   update HEP, introduce early core/ hip strengthening, extension based exercise   Vanessa [place], PT, DPT 07/06/21 1:43 PM

## 2021-07-06 ENCOUNTER — Telehealth: Payer: Self-pay | Admitting: Obstetrics & Gynecology

## 2021-07-06 ENCOUNTER — Ambulatory Visit: Payer: BLUE CROSS/BLUE SHIELD | Attending: Family Medicine

## 2021-07-06 DIAGNOSIS — M6281 Muscle weakness (generalized): Secondary | ICD-10-CM | POA: Diagnosis present

## 2021-07-06 DIAGNOSIS — M5440 Lumbago with sciatica, unspecified side: Secondary | ICD-10-CM | POA: Diagnosis not present

## 2021-07-06 DIAGNOSIS — G8929 Other chronic pain: Secondary | ICD-10-CM | POA: Insufficient documentation

## 2021-07-06 NOTE — Telephone Encounter (Signed)
Received a call from patient to get a follow up appointment with Dr. Gala Romney after her sonogram. I asked her did she need to see Dr. Gala Romney, or would she be willing to see another provider. She stated sine Dr. Gala Romney was the one who told her to follow up, she guess that's who she needs to see. I told her Dr. Gala Romney would not be back in the office until April. She was not happy about her not being here sooner, and stated "That's okay". Then she hung up the phone.   ?

## 2021-07-07 ENCOUNTER — Other Ambulatory Visit: Payer: Self-pay | Admitting: Obstetrics & Gynecology

## 2021-07-07 MED ORDER — IBREXAFUNGERP CITRATE 150 MG PO TABS: 2.0000 | ORAL_TABLET | Freq: Two times a day (BID) | ORAL | 0 refills | Status: DC

## 2021-07-07 NOTE — Progress Notes (Signed)
Pt has allergy to azoles.  ibrexafungerp 150 mg tablets  prescribed.  Pt is not pregnant.  ?

## 2021-07-09 NOTE — Therapy (Signed)
OUTPATIENT PHYSICAL THERAPY TREATMENT NOTE     Patient Name: Kristin Daniels MRN: [id number] DOB:Nov 28, 1975, 46 y.o., female Today's Date: 07/10/2021  PCP: Lyndee Hensen, DO REFERRING PROVIDER: Lyndee Hensen, DO   PT End of Session - 07/10/21 1228     Visit Number 10    Number of Visits 17    Date for PT Re-Evaluation 07/24/21    Authorization Type [place] [employer]    Authorization Time Period 2/10-3/23    Authorization - Visit Number 6    Authorization - Number of Visits 12    PT Start Time 7903    PT Stop Time 1311    PT Time Calculation (min) 41 min    Activity Tolerance Patient tolerated treatment well    Behavior During Therapy WFL for tasks assessed/performed                    Past Medical History:  Diagnosis Date   Acid reflux    Colon polyp    H. pylori infection    History of seasonal allergies    Hypercholesterolemia    Migraines    Pituitary tumor    Thyroid nodule    Vaginal Pap smear, abnormal    Past Surgical History:  Procedure Laterality Date   CESAREAN SECTION     OTHER SURGICAL HISTORY     hemangio removed at child and had something added to head to help indentation   Patient Active Problem List   Diagnosis Date Noted   Vaginal candidiasis 05/21/2021   Oral candidiasis 05/21/2021   Bilateral leg pain 05/21/2021   Lumbar radiculopathy, acute 04/10/2021   Costochondral chest pain 12/01/2020   Pain of right lower leg 12/01/2020   Abdominal wall fluid collections 12/01/2020   Seasonal allergic rhinitis due to pollen 08/28/2020   Acne 07/26/2020   Physically well but worried 07/26/2020   H. 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 No NPRS scale: 7/10 NPRS Worst scale: 7/10  Pain location: Low back  PAIN TYPE: pins and needles  Aggravating factors: bending, reaching  Relieving factors: elevating legs     OBJECTIVE:   *Unless otherwise noted all objective measures were captured on initial evaluation.          LUMBAR AROM   AROM AROM  05/29/2021 06/13/21  Flexion 50d with pain and increased Rt hip pain 70 degrees with increased low back pain  Extension 10d   Right lateral flexion 32d tightness   Left lateral flexion 28d   Right rotation 40   Left rotation 40    (Blank rows = not tested)       LE MMT:   MMT Right 05/29/2021 Left 05/29/2021 06/11/21 06/13/21 07/03/2021  Hip flexion 3/5 3+/5 4+/5 bilateral   4+/5 BIL  Hip extension 2+/5 2+/5  3+/5 bilaterally 3+/5  on Lt; 3/5 on Rt  Hip abduction 3/5 3/5  Rt: 3+/5 Lt: 4-/5 3+/5Lt; 4/5 Rt  Knee flexion 5/5 5/5     Knee extension 5/5 5/5     Ankle dorsiflexion 5/5 5/5     Ankle plantarflexion 5/5 5/5      (Blank rows = not tested)   FUNCTIONAL TESTS:  Plank test: 8.8 seconds 06/13/21: 10 seconds  06/25/21: 30 seconds  07/03/2021: 42 seconds 07/10/21: 43 seconds           TODAY'S TREATMENT  [facility] Adult PT Treatment:                                                DATE: 07/10/21 Therapeutic Exercise: Bike level 4 x 5 minutes  Prone pressup 1 x 10  Prone alternate arm/leg lift 2 x 10 bilateral  Sidelying fwd/bwd leg taps 2 x 10 bilateral   Hip bridge to single knee extension 2 x 10 bilateral  Plank x 3 to exhaustion: 40 seconds, 26 seconds, 43 seconds  Reviewed consistency  of completing extension biased movement as part of HEP for pain reduction    [facility] Adult PT Treatment:                                                DATE: 07/06/2021 Therapeutic Exercise: Supine bicycles with YTB around feet 3x20 Supine DKTC 3x30sec Side knee plank with hip abduction 2x10 BIL Standard plank x3 to exhaustion Prone swimmers 3x20 Standing cross-body knee drive with thigh attachment and 7# cable at [employer] machine 3x15 BIL Standing diagonal chop with wand 2x10 BIL with 13# cable Standing windmill stretch 2x10 BIL Seated low rows with 30# cable 2x10 Seated high rows with 30# cable 2x10 Seated lat pull-down with 30# cable 2x10 Seated forward/ backward shoulder rolls 2x10 each Standing trunk extension 2x10 with 5-sec hold Manual Therapy: N/A Neuromuscular re-ed: N/A Therapeutic Activity: N/A Modalities: N/A Self Care: N/A   [facility] Adult PT Treatment:                                                DATE: 07/03/2021 Therapeutic Exercise: Standing Pallof press with 10# cable 2x10 with 5-sec hold BIL Standing abdominal press-down with 30# cable 3x10 15# kettlebell swing 3x20 Standing Cybex hip abduction with 30# cable 2x10 BIL Standing Cybex hip extension with 30# cable 2x10 BIL Seated low rows with 25# cable 2x10 Seated high rows with 25# cable 2x10 Seated lat pull-down with 25# cable 2x10 Seated shoulder rolls 2x10 forward and backward Manual Therapy: Effleurage and tapotement to BIL lumbar paraspinals Neuromuscular re-ed: N/A Therapeutic Activity: N/A Modalities: N/A Self Care: N/A  [facility] Adult PT Treatment:                                                DATE: 06/27/21 Therapeutic Exercise: Recumbent bike x 5 minutes; level 4  Prone pressup 2 x 10 partial range  Bird dog  2 x 10  Fire hydrant 2 x 10 green band  Donkey kicks 2 x 10 4# Marching on stability ball 2 x 10  Resisted shoulder extension while seated on stability ball 2 x 15 red band  Pallof  press black band 2 x 10  Sidelying leg taps fwd/bwd 2 x 10         PATIENT EDUCATION:  Education details: see treatment  Person educated: Patient Education method: Veterinary surgeon Education comprehension: verbalized understanding.    HOME EXERCISE PROGRAM: Access Code: [email or web address] URL: [email or web address] Date: 06/06/2021 Prepared by: Octavio Manns  Exercises Supine Posterior Pelvic Tilt - 1 x daily - 7 x weekly - 2 sets - 10 reps - 5" hold Supine 90/90 Abdominal Bracing - 1 x daily - 7 x weekly - 4 reps - 20 sec hold Supine Bridge - 1 x daily - 7 x weekly - 3 sets - 10 reps - 3 sec hold Supine Double Knee to Chest - 1 x daily - 7 x weekly - 2-3 reps - 30 sec hold Cat to Child's Pose with Posterior Pelvic Tilt - 1 x daily - 7 x weekly - 2-3 reps - 30 sec hold Standing Hip Abduction with Resistance at Ankles and Counter Support - 1 x daily - 7 x weekly - 3 sets - 10 reps Standing Hip Extension with Resistance at Ankles and Counter Support - 1 x daily - 7 x weekly - 3 sets - 10 reps  Added 07/06/2021: Modified Side Plank with Hip Abduction - 1 x daily - 7 x weekly - 2 sets - 10 reps Standard Plank - 1 x daily - 7 x weekly - 3 sets Prone Swimmer - 1 x daily - 7 x weekly - 3 sets - 20 reps    ASSESSMENT:   CLINICAL IMPRESSION: Able to reduce her pain at the beginning of the session with lumbar extension biased movement. She was encouraged to consistently complete repeated extension as part of HEP to assist in overall pain reduction with patient verbalizing understanding. Continued with core and hip strengthening with patient demonstrating overall improvements in lumbopelvic stability. She reported no sensation of "pins and needles" in her back at the end of session, just muscle soreness.    REHAB POTENTIAL: Good   CLINICAL DECISION MAKING: Stable/uncomplicated   EVALUATION COMPLEXITY: Low     GOALS: Goals reviewed with patient? Yes   SHORT TERM GOALS:   STG  Name Target Date Goal status  1 Pt will report understanding and adherence to her HEP in order to promote independence in the management of her primary impairments. Baseline: HEP to be issued treatment 1 06/26/2021 Achieved     LONG TERM GOALS:    LTG Name Target Date Goal status  1 Pt will demonstrate 65 degrees of lumbar flexion with 0-2/10 pain in order to put on her shoes without limitation. Baseline: 50 degrees with pain; 06/13/21 70 degrees pain 6/10  07/24/2021 Partially met   2 Pt will achieve BIL global hip strength of 4+/5 or greater in order to stand from a seated position with less limitation. Baseline: See MMT chart 07/24/2021 Partially met   3 Pt will achieve a functional plank of 40 seconds or longer in order to establish functional core strength necessary to progress her independent strengthening regimen. Baseline: 8.8 seconds; 06/13/21 10 seconds; 07/03/2021: 42 seconds 07/24/2021 ACHIEVED  4 Pt will report 50% improvement in overall symptoms in order to perform housework with less limitation. Baseline: 06/13/21  5% subjective improvement  07/24/2021 Ongoing     PLAN: PT FREQUENCY: 2x/week   PT DURATION: 8 weeks   PLANNED INTERVENTIONS: Therapeutic exercises, Therapeutic activity, Neuro Muscular re-education, Patient/Family education, Joint mobilization, Dry Needling, Spinal mobilization, Cryotherapy, Moist heat, Taping, and Manual therapy   PLAN FOR NEXT SESSION:   progress core/ hip strengthening, extension based exercise  Gwendolyn Grant, PT, DPT, ATC 07/10/21 1:12 PM

## 2021-07-10 ENCOUNTER — Other Ambulatory Visit: Payer: Self-pay

## 2021-07-10 ENCOUNTER — Ambulatory Visit: Payer: BLUE CROSS/BLUE SHIELD

## 2021-07-10 DIAGNOSIS — M6281 Muscle weakness (generalized): Secondary | ICD-10-CM

## 2021-07-10 DIAGNOSIS — M5440 Lumbago with sciatica, unspecified side: Secondary | ICD-10-CM

## 2021-07-10 DIAGNOSIS — G8929 Other chronic pain: Secondary | ICD-10-CM

## 2021-07-11 ENCOUNTER — Other Ambulatory Visit: Payer: Self-pay | Admitting: Endocrinology

## 2021-07-11 DIAGNOSIS — Z87898 Personal history of other specified conditions: Secondary | ICD-10-CM

## 2021-07-11 DIAGNOSIS — E221 Hyperprolactinemia: Secondary | ICD-10-CM

## 2021-07-13 NOTE — Therapy (Signed)
OUTPATIENT PHYSICAL THERAPY TREATMENT NOTE     Patient Name: Kristin Daniels MRN: [id number] DOB:29-Jul-1975, 46 y.o., female Today's Date: 07/14/2021  PCP: Lyndee Hensen, DO REFERRING PROVIDER: Lyndee Hensen, DO   PT End of Session - 07/14/21 1121     Visit Number 11    Number of Visits 17    Date for PT Re-Evaluation 07/24/21    Authorization Type [place] [facility]    Authorization Time Period 2/10-3/23    Authorization - Visit Number 7    Authorization - Number of Visits 12    PT Start Time 5170    PT Stop Time 1200    PT Time Calculation (min) 39 min    Activity Tolerance Patient tolerated treatment well    Behavior During Therapy WFL for tasks assessed/performed                     Past Medical History:  Diagnosis Date   Acid reflux    Colon polyp    H. pylori infection    History of seasonal allergies    Hypercholesterolemia    Migraines    Pituitary tumor    Thyroid nodule    Vaginal Pap smear, abnormal    Past Surgical History:  Procedure Laterality Date   CESAREAN SECTION     OTHER SURGICAL HISTORY     hemangio removed at child and had something added to head to help indentation   Patient Active Problem List   Diagnosis Date Noted   Vaginal candidiasis 05/21/2021   Oral candidiasis 05/21/2021   Bilateral leg pain 05/21/2021   Lumbar radiculopathy, acute 04/10/2021   Costochondral chest pain 12/01/2020   Pain of right lower leg 12/01/2020   Abdominal wall fluid collections 12/01/2020   Seasonal allergic rhinitis due to pollen 08/28/2020   Acne 07/26/2020   Physically well but worried 07/26/2020   H. 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   PAIN:  Are you having pain? No NPRS scale: 6/10 NPRS Worst scale: 7/10  Pain location: Low back  PAIN TYPE: pins and needles  Aggravating factors: bending, reaching  Relieving factors: elevating legs     OBJECTIVE:   *Unless otherwise noted all objective measures were captured on initial evaluation.          LUMBAR AROM   AROM AROM  05/29/2021 06/13/21 07/14/2021  Flexion 50d with pain and increased Rt hip pain 70 degrees with increased low back pain 70d with mild Rt hip pain  Extension 10d  25d  Right lateral flexion 32d tightness  32d tightness  Left lateral flexion 28d  25d  Right rotation 40  40  Left rotation 40  40   (Blank rows = not tested)       LE MMT:   MMT Right 05/29/2021 Left 05/29/2021 06/11/21 06/13/21 07/03/2021 07/14/2021:  Hip flexion  3/5 3+/5 4+/5 bilateral   4+/5 BIL 5/5 BIL  Hip extension 2+/5 2+/5  3+/5 bilaterally 3+/5 on Lt; 3/5 on Rt 4+/5 on Lt; 3+/5 on Rt  Hip abduction 3/5 3/5  Rt: 3+/5 Lt: 4-/5 3+/5Lt; 4/5 Rt 5/5 BIL  Knee flexion 5/5 5/5      Knee extension 5/5 5/5      Ankle dorsiflexion 5/5 5/5      Ankle plantarflexion 5/5 5/5       (Blank rows = not tested)   FUNCTIONAL TESTS:  Plank test: 8.8 seconds 06/13/21: 10 seconds  06/25/21: 30 seconds  07/03/2021: 42 seconds 07/10/21: 43 seconds 07/14/2021: 40 seconds           TODAY'S TREATMENT   [facility] Adult PT Treatment:                                                DATE: 07/14/2021 Therapeutic Exercise: Tall-kneeling hip thrusts with knees on Airex pad with waist attachment to 23# cable 3x10 Standing Pallof press  with 7# cable 2x10 with 5-sec hold BIL Mini squat side steps with RTB around thighs 3x20 BIL Backwards monster walks with RTB around thighs 3x20 BIL Standing abdominal press-down with 20# cable 3x10 Standing trunk extension stretch 2x10 Prone swimmers 3x20 Manual Therapy: N/A Neuromuscular re-ed: N/A Therapeutic Activity: Pt educated on objective measures, progress made in PT to this point Modalities: N/A Self Care: N/A   [facility] Adult PT Treatment:                                                DATE: 07/10/21 Therapeutic Exercise: Bike level 4 x 5 minutes  Prone pressup 1 x 10  Prone alternate arm/leg lift 2 x 10 bilateral  Sidelying fwd/bwd leg taps 2 x 10 bilateral   Hip bridge to single knee extension 2 x 10 bilateral  Plank x 3 to exhaustion: 40 seconds, 26 seconds, 43 seconds  Reviewed consistency of completing extension biased movement as part of HEP for pain reduction    [facility] Adult PT Treatment:                                                DATE: 07/06/2021 Therapeutic Exercise: Supine bicycles with YTB around feet 3x20 Supine DKTC 3x30sec Side knee plank with hip abduction 2x10 BIL Standard plank x3 to exhaustion Prone swimmers 3x20 Standing cross-body knee drive with thigh attachment and 7# cable at [employer] machine 3x15 BIL Standing diagonal chop with wand 2x10 BIL with 13# cable Standing windmill stretch 2x10 BIL Seated low rows with 30# cable 2x10 Seated high rows with 30# cable 2x10 Seated lat pull-down with 30# cable 2x10 Seated forward/ backward shoulder rolls 2x10 each Standing trunk extension 2x10 with 5-sec hold Manual Therapy: N/A Neuromuscular re-ed: N/A Therapeutic Activity: N/A Modalities: N/A Self Care: N/A          PATIENT EDUCATION:  Education details: see treatment  Person educated: Patient Education method: Verbal instruction Education comprehension: verbalized understanding.    HOME EXERCISE PROGRAM: Access Code:  [email or web address] URL: [email or web address] Date: 06/06/2021  Prepared by: Octavio Manns  Exercises Supine Posterior Pelvic Tilt - 1 x daily - 7 x weekly - 2 sets - 10 reps - 5" hold Supine 90/90 Abdominal Bracing - 1 x daily - 7 x weekly - 4 reps - 20 sec hold Supine Bridge - 1 x daily - 7 x weekly - 3 sets - 10 reps - 3 sec hold Supine Double Knee to Chest - 1 x daily - 7 x weekly - 2-3 reps - 30 sec hold Cat to Child's Pose with Posterior Pelvic Tilt - 1 x daily - 7 x weekly - 2-3 reps - 30 sec hold Standing Hip Abduction with Resistance at Ankles and Counter Support - 1 x daily - 7 x weekly - 3 sets - 10 reps Standing Hip Extension with Resistance at Ankles and Counter Support - 1 x daily - 7 x weekly - 3 sets - 10 reps  Added 07/06/2021: Modified Side Plank with Hip Abduction - 1 x daily - 7 x weekly - 2 sets - 10 reps Standard Plank - 1 x daily - 7 x weekly - 3 sets Prone Swimmer - 1 x daily - 7 x weekly - 3 sets - 20 reps    ASSESSMENT:   CLINICAL IMPRESSION: Upon re-assessment of objective measures, the pt has made excellent progress in global hip strength, with the primary remaining impairments in Rt hip extension. She also rates her progress in PT as having experienced a 50% improvement to this point. The pt responded well to all interventions today, demonstrating good form and no increase in pain with selected exercises. She will continue to benefit from skilled PT to address her primary impairments and return to her prior level of function with less limitation.   REHAB POTENTIAL: Good   CLINICAL DECISION MAKING: Stable/uncomplicated   EVALUATION COMPLEXITY: Low     GOALS: Goals reviewed with patient? Yes   SHORT TERM GOALS:   STG Name Target Date Goal status  1 Pt will report understanding and adherence to her HEP in order to promote independence in the management of her primary impairments. Baseline: HEP to be issued treatment 1 06/26/2021 Achieved     LONG TERM  GOALS:    LTG Name Target Date Goal status  1 Pt will demonstrate 65 degrees of lumbar flexion with 0-2/10 pain in order to put on her shoes without limitation. Baseline: 50 degrees with pain; 06/13/21 70 degrees pain 6/10  07/14/2021: 70 degrees 6/10 pain 07/24/2021 Partially met   2 Pt will achieve BIL global hip strength of 4+/5 or greater in order to stand from a seated position with less limitation. Baseline: See MMT chart 07/14/2021: See updated MMT chart, all met except Rt hip extension MMT 07/24/2021 Partially met   3 Pt will achieve a functional plank of 40 seconds or longer in order to establish functional core strength necessary to progress her independent strengthening regimen. Baseline: 8.8 seconds; 06/13/21 10 seconds; 07/03/2021: 42 seconds 07/24/2021 ACHIEVED  4 Pt will report 50% improvement in overall symptoms in order to perform housework with less limitation. Baseline: 06/13/21 5% subjective improvement  07/14/2021: Pt reports 60% improvement in PT 07/24/2021 ACHIEVED    PLAN: PT FREQUENCY: 2x/week   PT DURATION: 8 weeks   PLANNED INTERVENTIONS: Therapeutic exercises, Therapeutic activity, Neuro Muscular re-education, Patient/Family education, Joint mobilization, Dry Needling, Spinal mobilization, Cryotherapy, Moist heat, Taping, and Manual therapy   PLAN FOR NEXT SESSION:   progress core/ hip strengthening, extension based exercise  Vanessa Young, PT, DPT 07/14/21 11:59 AM

## 2021-07-14 ENCOUNTER — Other Ambulatory Visit: Payer: Self-pay

## 2021-07-14 ENCOUNTER — Ambulatory Visit: Payer: BLUE CROSS/BLUE SHIELD

## 2021-07-14 DIAGNOSIS — G8929 Other chronic pain: Secondary | ICD-10-CM

## 2021-07-14 DIAGNOSIS — M5440 Lumbago with sciatica, unspecified side: Secondary | ICD-10-CM

## 2021-07-14 DIAGNOSIS — M6281 Muscle weakness (generalized): Secondary | ICD-10-CM

## 2021-07-19 NOTE — Therapy (Signed)
?OUTPATIENT PHYSICAL THERAPY TREATMENT NOTE ? ? ? ? ?Patient Name: Kristin Daniels ?MRN: [id number] ?DOB:May 01, 1976, 46 y.o., female ?Today's Date: 07/20/2021 ? ?PCP: Lyndee Hensen, DO ?REFERRING PROVIDER: Lyndee Hensen, DO ? ? PT End of Session - 07/20/21 1254   ? ? Visit Number 12   ? Number of Visits 17   ? Date for PT Re-Evaluation 07/24/21   ? Authorization Type [place] MCD   ? Authorization Time Period 2/10-3/23   ? Authorization - Visit Number 9   ? Authorization - Number of Visits 12   ? PT Start Time 1253   ? PT Stop Time 1333   ? PT Time Calculation (min) 40 min   ? Activity Tolerance Patient tolerated treatment well   ? Behavior During Therapy [facility] for tasks assessed/performed   ? ?  ?  ? ?  ? ? ? ? ? ? ? ? ? ? ? ?Past Medical History:  ?Diagnosis Date  ? Acid reflux   ? Colon polyp   ? H. pylori infection   ? History of seasonal allergies   ? Hypercholesterolemia   ? Migraines   ? Pituitary tumor   ? Thyroid nodule   ? Vaginal Pap smear, abnormal   ? ?Past Surgical History:  ?Procedure Laterality Date  ? CESAREAN SECTION    ? OTHER SURGICAL HISTORY    ? hemangio removed at child and had something added to head to help indentation  ? ?Patient Active Problem List  ? Diagnosis Date Noted  ? Vaginal candidiasis 05/21/2021  ? Oral candidiasis 05/21/2021  ? Bilateral leg pain 05/21/2021  ? Lumbar radiculopathy, acute 04/10/2021  ? Costochondral chest pain 12/01/2020  ? Pain of right lower leg 12/01/2020  ? Abdominal wall fluid collections 12/01/2020  ? Seasonal allergic rhinitis due to pollen 08/28/2020  ? Acne 07/26/2020  ? Physically well but worried 07/26/2020  ? H. pylori infection 03/26/2019  ? Lymph node symptom 09/21/2018  ? Chronic idiopathic constipation 12/29/2017  ? 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 History of pituitary tumor 04/21/2017  ? History of thyroid nodule 04/21/2017  ? Enlarged thyroid gland 03/14/2017  ? Fibrocystic breast changes, bilateral 03/14/2017  ? Benign tumor of pituitary gland  ([place]) 03/14/2017  ? Uterine leiomyoma 03/14/2017  ? ? ?REFERRING DIAG: M54.17 (ICD-10-CM) - Lumbosacral radiculopathy at L5 ? ?THERAPY DIAG:  ?Chronic bilateral low back pain with sciatica, sciatica laterality unspecified ? ?Muscle weakness (generalized) ? ?PERTINENT HISTORY: N/A ? ?PRECAUTIONS: None ? ?SUBJECTIVE: I was sore after the last session. My upper back hurts today and my lower back has pins and needles. ? ? ?PAIN:  ?Are you having pain? No ?NPRS scale: 6/10 ?NPRS Worst scale: 7/10  ?Pain location: Low back  ?PAIN TYPE: pins and needles  ?Aggravating factors: bending, reaching  ?Relieving factors: elevating legs   ? ? ?OBJECTIVE: ?  *Unless otherwise noted all objective measures were captured on initial evaluation.  ?        ?LUMBAR AROM ?  ?AROM AROM  ?05/29/2021 06/13/21 07/14/2021  ?Flexion 50d with pain and increased Rt hip pain 70 degrees with increased low back pain 70d with mild Rt hip pain  ?Extension 10d  25d  ?Right lateral flexion 32d tightness  32d tightness  ?Left lateral flexion 28d  25d  ?Right rotation 40  40  ?Left rotation 40  40  ? (Blank rows = not tested) ?  ?  ?  ?LE MMT: ?  ?MMT Right ?05/29/2021 Left ?  05/29/2021 06/11/21 06/13/21 07/03/2021 07/14/2021:  ?Hip flexion 3/5 3+/5 4+/5 bilateral   4+/5 BIL 5/5 BIL  ?Hip extension 2+/5 2+/5  3+/5 bilaterally 3+/5 on Lt; 3/5 on Rt 4+/5 on Lt; 3+/5 on Rt  ?Hip abduction 3/5 3/5  Rt: 3+/5 Lt: 4-/5 3+/5Lt; 4/5 Rt 5/5 BIL  ?Knee flexion 5/5 5/5      ?Knee extension 5/5 5/5      ?Ankle dorsiflexion 5/5 5/5      ?Ankle plantarflexion 5/5 5/5      ? (Blank rows = not tested) ?  ?FUNCTIONAL TESTS:  ?Plank test: 8.8 seconds ?06/13/21: 10 seconds  ?06/25/21: 30 seconds  ?07/03/2021: 42 seconds ?07/10/21: 43 seconds ?07/14/2021: 40 seconds ?  ?  ?  ?  ?  ?TODAY'S TREATMENT  ?[facility] Adult PT Treatment:                                                DATE: 07/20/2021 ?Therapeutic Exercise: ?Bike level 5 x 5 mins while gathering subjective ?Cybex hip extension 12.5# 3x10  BIL ?Hamstring curl 20# 1x10, 25# 2x10 ?Knee extension 20# 3x10 ?Standing Pallof press with 7# cable 2x10 with 3-sec hold BIL ?Mini squat side steps with BlueTB around thighs 3x20 BIL ?Backwards monster walks with BlueTB around thighs 3x20 BIL ?Seated low rows with 30# cable 2x10 ?Seated high rows with 30# cable 2x10 ?Seated lat pull-down with 30# cable 2x10 ? ? ?[facility] Adult PT Treatment:                                                DATE: 07/14/2021 ?Therapeutic Exercise: ?Tall-kneeling hip thrusts with knees on Airex pad with waist attachment to 23# cable 3x10 ?Standing Pallof press with 7# cable 2x10 with 5-sec hold BIL ?Mini squat side steps with RTB around thighs 3x20 BIL ?Backwards monster walks with RTB around thighs 3x20 BIL ?Standing abdominal press-down with 20# cable 3x10 ?Standing trunk extension stretch 2x10 ?Prone swimmers 3x20 ?Manual Therapy: ?N/A ?Neuromuscular re-ed: ?N/A ?Therapeutic Activity: ?Pt educated on objective measures, progress made in PT to this point ?Modalities: ?N/A ?Self Care: ?N/A ? ? ?[place] Adult PT Treatment:                                                DATE: 07/10/21 ?Therapeutic Exercise: ?Bike level 4 x 5 minutes  ?Prone pressup 1 x 10  ?Prone alternate arm/leg lift 2 x 10 bilateral  ?Sidelying fwd/bwd leg taps 2 x 10 bilateral   ?Hip bridge to single knee extension 2 x 10 bilateral  ?Plank x 3 to exhaustion: 40 seconds, 26 seconds, 43 seconds  ?Reviewed consistency of completing extension biased movement as part of HEP for pain reduction  ?  ?  ?  ?PATIENT EDUCATION:  ?Education details: see treatment  ?Person educated: Patient ?Education method: Verbal instruction ?Education comprehension: verbalized understanding.  ?  ?HOME EXERCISE PROGRAM: ?Access Code: [email or web address] ?URL: [email or web address] ?Date: 06/06/2021 ?Prepared by: Octavio Manns ? ?Exercises ?Supine Posterior Pelvic Tilt - 1 x daily - 7 x weekly - 2 sets - 10 reps - 5"  hold ?Supine 90/90 Abdominal Bracing  - 1 x daily - 7 x weekly - 4 reps - 20 sec hold ?Supine Bridge - 1 x daily - 7 x weekly - 3 sets - 10 reps - 3 sec hold ?Supine Double Knee to Chest - 1 x daily - 7 x weekly - 2-3 reps - 30 sec hold ?Cat to Child's Pose with Posterior Pelvic Tilt - 1 x daily - 7 x weekly - 2-3 reps - 30 sec hold ?Standing Hip Abduction with Resistance at Ankles and Counter Support - 1 x daily - 7 x weekly - 3 sets - 10 reps ?Standing Hip Extension with Resistance at Ankles and Counter Support - 1 x daily - 7 x weekly - 3 sets - 10 reps ? ?Added 07/06/2021: ?Modified Side Plank with Hip Abduction - 1 x daily - 7 x weekly - 2 sets - 10 reps ?Standard Plank - 1 x daily - 7 x weekly - 3 sets ?Prone Swimmer - 1 x daily - 7 x weekly - 3 sets - 20 reps ? ?  ?ASSESSMENT: ?  ?CLINICAL IMPRESSION: ?Patient presents to PT with moderate to high levels of pain in her shoulders and upper back and reports HEP compliance. Today's session focused on improving lower extremity, back, and core strength, particularly focusing on bilateral hip extensors. She was able to complete all prescribed exercises with no adverse effects. Patient continues to benefit from skilled PT services and should be progressed as able to improve functional independence. ? ?REHAB POTENTIAL: Good ?  ?CLINICAL DECISION MAKING: Stable/uncomplicated ?  ?EVALUATION COMPLEXITY: Low ?  ?  ?GOALS: ?Goals reviewed with patient? Yes ?  ?SHORT TERM GOALS: ?  ?STG Name Target Date Goal status  ?1 Pt will report understanding and adherence to her HEP in order to promote independence in the management of her primary impairments. ?Baseline: HEP to be issued treatment 1 06/26/2021 Achieved   ?  ?LONG TERM GOALS:  ?  ?LTG Name Target Date Goal status  ?1 Pt will demonstrate 65 degrees of lumbar flexion with 0-2/10 pain in order to put on her shoes without limitation. ?Baseline: 50 degrees with pain; 06/13/21 70 degrees pain 6/10  ?07/14/2021: 70 degrees 6/10 pain 07/24/2021 Partially met   ?2 Pt will  achieve BIL global hip strength of 4+/5 or greater in order to stand from a seated position with less limitation. ?Baseline: See MMT chart ?07/14/2021: See updated MMT chart, all met except Rt hip extens

## 2021-07-20 ENCOUNTER — Ambulatory Visit: Payer: BLUE CROSS/BLUE SHIELD

## 2021-07-20 ENCOUNTER — Other Ambulatory Visit: Payer: Self-pay

## 2021-07-20 DIAGNOSIS — G8929 Other chronic pain: Secondary | ICD-10-CM

## 2021-07-20 DIAGNOSIS — M5440 Lumbago with sciatica, unspecified side: Secondary | ICD-10-CM | POA: Diagnosis not present

## 2021-07-20 DIAGNOSIS — M6281 Muscle weakness (generalized): Secondary | ICD-10-CM

## 2021-07-21 ENCOUNTER — Ambulatory Visit: Payer: BLUE CROSS/BLUE SHIELD

## 2021-07-21 NOTE — Therapy (Signed)
?OUTPATIENT PHYSICAL THERAPY TREATMENT NOTE/ DISCHARGE SUMMARY ? ? ? ? ?Patient Name: Carlos Shawn ?MRN: [id number] ?DOB:11-18-1975, 46 y.o., female ?Today's Date: 07/24/2021 ? ?PCP: Lyndee Hensen, DO ?REFERRING PROVIDER: Lyndee Hensen, DO ? ? PT End of Session - 07/24/21 1129   ? ? Visit Number 13   ? Number of Visits 17   ? Date for PT Re-Evaluation 07/24/21   ? Authorization Type [place] MCD   ? Authorization Time Period 2/10-3/23   ? Authorization - Visit Number 10   ? Authorization - Number of Visits 12   ? PT Start Time 0034   ? PT Stop Time 9179   ? PT Time Calculation (min) 44 min   ? Activity Tolerance Patient tolerated treatment well   ? Behavior During Therapy [facility] for tasks assessed/performed   ? ?  ?  ? ?  ? ? ? ? ? ? ? ? ? ? ? ? ?Past Medical History:  ?Diagnosis Date  ? Acid reflux   ? Colon polyp   ? H. pylori infection   ? History of seasonal allergies   ? Hypercholesterolemia   ? Migraines   ? Pituitary tumor   ? Thyroid nodule   ? Vaginal Pap smear, abnormal   ? ?Past Surgical History:  ?Procedure Laterality Date  ? CESAREAN SECTION    ? OTHER SURGICAL HISTORY    ? hemangio removed at child and had something added to head to help indentation  ? ?Patient Active Problem List  ? Diagnosis Date Noted  ? Vaginal candidiasis 05/21/2021  ? Oral candidiasis 05/21/2021  ? Bilateral leg pain 05/21/2021  ? Lumbar radiculopathy, acute 04/10/2021  ? Costochondral chest pain 12/01/2020  ? Pain of right lower leg 12/01/2020  ? Abdominal wall fluid collections 12/01/2020  ? Seasonal allergic rhinitis due to pollen 08/28/2020  ? Acne 07/26/2020  ? Physically well but worried 07/26/2020  ? H. pylori infection 03/26/2019  ? Lymph node symptom 09/21/2018  ? Chronic idiopathic constipation 12/29/2017  ? 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 History of pituitary tumor 04/21/2017  ? History of thyroid nodule 04/21/2017  ? Enlarged thyroid gland 03/14/2017  ? Fibrocystic breast changes, bilateral 03/14/2017  ? Benign  tumor of pituitary gland ([place]) 03/14/2017  ? Uterine leiomyoma 03/14/2017  ? ? ?REFERRING DIAG: M54.17 (ICD-10-CM) - Lumbosacral radiculopathy at L5 ? ?THERAPY DIAG:  ?Chronic bilateral low back pain with sciatica, sciatica laterality unspecified ? ?Muscle weakness (generalized) ? ?PERTINENT HISTORY: N/A ? ?PRECAUTIONS: None ? ?SUBJECTIVE: Pt reports her pain is about 6/10 today ,adding that she still has "pins and needles" in her low back. She adds that she has been doing her HEP daily. ? ? ?PAIN:  ?Are you having pain? No ?NPRS scale: 6/10 ?NPRS Worst scale: 7/10  ?Pain location: Low back  ?PAIN TYPE: pins and needles  ?Aggravating factors: bending, reaching  ?Relieving factors: elevating legs   ? ? ?OBJECTIVE: ?  *Unless otherwise noted all objective measures were captured on initial evaluation.  ?        ?LUMBAR AROM ?  ?AROM AROM  ?05/29/2021 06/13/21 07/14/2021 07/24/2021  ?Flexion 50d with pain and increased Rt hip pain 70 degrees with increased low back pain 70d with mild Rt hip pain 72d with mild Rt hip/ low back pain  ?Extension 10d  25d 20d  ?Right lateral flexion 32d tightness  32d tightness 30 tightness  ?Left lateral flexion 28d  25d 30d  ?Right rotation 40  40 45  ?  Left rotation 40  40 45  ? (Blank rows = not tested) ?  ?  ?  ?LE MMT: ?  ?MMT Right ?05/29/2021 Left ?05/29/2021 06/11/21 06/13/21 07/03/2021 07/14/2021: 07/24/2021:  ?Hip flexion 3/5 3+/5 4+/5 bilateral   4+/5 BIL 5/5 BIL   ?Hip extension 2+/5 2+/5  3+/5 bilaterally 3+/5 on Lt; 3/5 on Rt 4+/5 on Lt; 3+/5 on Rt 3+/5 on Lt; ?3/5 on Rt  ?Hip abduction 3/5 3/5  Rt: 3+/5 Lt: 4-/5 3+/5Lt; 4/5 Rt 5/5 BIL   ?Knee flexion 5/5 5/5       ?Knee extension 5/5 5/5       ?Ankle dorsiflexion 5/5 5/5       ?Ankle plantarflexion 5/5 5/5       ? (Blank rows = not tested) ?  ?FUNCTIONAL TESTS:  ?Plank test: 8.8 seconds ?06/13/21: 10 seconds  ?06/25/21: 30 seconds  ?07/03/2021: 42 seconds ?07/10/21: 43 seconds ?07/14/2021: 40 seconds ?  ?  ?  ?  ?  ?TODAY'S TREATMENT  ? ?[facility]  Adult PT Treatment:                                                DATE: 07/24/2021 ?Therapeutic Exercise: ?Tall-kneeling hip thrusts with knees on Airex pad with waist attachment to 23# cable 3x10 ?Standing Pallof press walkout with 7# cable 2x8 with concentric rotation at end of walkout BIL ?Standing rotation end-range stretch x10 BIL ?Mini squat side steps with GTB around thighs 3x20 BIL ?Backwards monster walks with GTB around thighs 3x20 BIL ?Standing abdominal press-down with 23# cable 3x10 ?Standing trunk extension stretch 2x10 ?Quadruped donkey kicks 2x10 BIL ?Side knee plank with hip abduction 2x10 BIL ?Manual Therapy: ?N/A ?Neuromuscular re-ed: ?N/A ?Therapeutic Activity: ?Re-assessment of objective information with pt education about results ?Modalities: ?N/A ?Self Care: ?N/A ? ? ?[place] Adult PT Treatment:                                                DATE: 07/20/2021 ?Therapeutic Exercise: ?Bike level 5 x 5 mins while gathering subjective ?Cybex hip extension 12.5# 3x10 BIL ?Hamstring curl 20# 1x10, 25# 2x10 ?Knee extension 20# 3x10 ?Standing Pallof press with 7# cable 2x10 with 3-sec hold BIL ?Mini squat side steps with BlueTB around thighs 3x20 BIL ?Backwards monster walks with BlueTB around thighs 3x20 BIL ?Seated low rows with 30# cable 2x10 ?Seated high rows with 30# cable 2x10 ?Seated lat pull-down with 30# cable 2x10 ? ? ?[facility] Adult PT Treatment:                                                DATE: 07/14/2021 ?Therapeutic Exercise: ?Tall-kneeling hip thrusts with knees on Airex pad with waist attachment to 23# cable 3x10 ?Standing Pallof press with 7# cable 2x10 with 5-sec hold BIL ?Mini squat side steps with RTB around thighs 3x20 BIL ?Backwards monster walks with RTB around thighs 3x20 BIL ?Standing abdominal press-down with 20# cable 3x10 ?Standing trunk extension stretch 2x10 ?Prone swimmers 3x20 ?Manual Therapy: ?N/A ?Neuromuscular re-ed: ?N/A ?Therapeutic Activity: ?Pt educated on objective  measures, progress  made in PT to this point ?Modalities: ?N/A ?Self Care: ?N/A ?  ?  ?  ?PATIENT EDUCATION:  ?Education details: Discussed POC, prognosis, and importance of HEP adherence following discharge ?Person educated: Patient ?Education method: Verbal instruction ?Education comprehension: verbalized understanding.  ?  ?HOME EXERCISE PROGRAM: ?Access Code: [email or web address] ?URL: [email or web address] ?Date: 06/06/2021 ?Prepared by: Octavio Manns ? ?Exercises ?Supine Posterior Pelvic Tilt - 1 x daily - 7 x weekly - 2 sets - 10 reps - 5" hold ?Supine 90/90 Abdominal Bracing - 1 x daily - 7 x weekly - 4 reps - 20 sec hold ?Supine Bridge - 1 x daily - 7 x weekly - 3 sets - 10 reps - 3 sec hold ?Supine Double Knee to Chest - 1 x daily - 7 x weekly - 2-3 reps - 30 sec hold ?Cat to Child's Pose with Posterior Pelvic Tilt - 1 x daily - 7 x weekly - 2-3 reps - 30 sec hold ?Standing Hip Abduction with Resistance at Ankles and Counter Support - 1 x daily - 7 x weekly - 3 sets - 10 reps ?Standing Hip Extension with Resistance at Ankles and Counter Support - 1 x daily - 7 x weekly - 3 sets - 10 reps ? ?Added 07/06/2021: ?Modified Side Plank with Hip Abduction - 1 x daily - 7 x weekly - 2 sets - 10 reps ?Standard Plank - 1 x daily - 7 x weekly - 3 sets ?Prone Swimmer - 1 x daily - 7 x weekly - 3 sets - 20 reps ? ?  ?ASSESSMENT: ?  ?CLINICAL IMPRESSION: ?Pt responded well to all strengthening exercises today, demonstrating good form and no increase in pain. Upon re-assessment of objective measures, it was found that the pt has seen a plateau in progress with PT. She made good early improvements in strength, ROM, and functional ability, however, she has not seen continued improvement over the past few weeks. Due to this plateau, the pt is discharged at this time to independently progress her HEP. She was encouraged to follow up with her PCM to discuss additional treatment alternatives as needed. ? ?REHAB POTENTIAL: Good ?   ?CLINICAL DECISION MAKING: Stable/uncomplicated ?  ?EVALUATION COMPLEXITY: Low ?  ?  ?GOALS: ?Goals reviewed with patient? Yes ?  ?SHORT TERM GOALS: ?  ?STG Name Target Date Goal status  ?1 Pt will report un

## 2021-07-23 ENCOUNTER — Other Ambulatory Visit: Payer: Self-pay

## 2021-07-23 ENCOUNTER — Other Ambulatory Visit (INDEPENDENT_AMBULATORY_CARE_PROVIDER_SITE_OTHER): Payer: PRIVATE HEALTH INSURANCE

## 2021-07-23 DIAGNOSIS — E042 Nontoxic multinodular goiter: Secondary | ICD-10-CM

## 2021-07-23 DIAGNOSIS — E221 Hyperprolactinemia: Secondary | ICD-10-CM

## 2021-07-23 LAB — TSH: TSH: 0.65 u[IU]/mL (ref 0.35–5.50)

## 2021-07-23 LAB — T4, FREE: Free T4: 0.61 ng/dL (ref 0.60–1.60)

## 2021-07-24 ENCOUNTER — Ambulatory Visit: Payer: BLUE CROSS/BLUE SHIELD

## 2021-07-24 DIAGNOSIS — M5440 Lumbago with sciatica, unspecified side: Secondary | ICD-10-CM | POA: Diagnosis not present

## 2021-07-24 DIAGNOSIS — G8929 Other chronic pain: Secondary | ICD-10-CM

## 2021-07-24 DIAGNOSIS — M5441 Lumbago with sciatica, right side: Secondary | ICD-10-CM

## 2021-07-24 DIAGNOSIS — M6281 Muscle weakness (generalized): Secondary | ICD-10-CM

## 2021-07-24 LAB — PROLACTIN: Prolactin: 17.5 ng/mL (ref 4.8–23.3)

## 2021-07-26 ENCOUNTER — Ambulatory Visit: Payer: Medicaid Other | Admitting: Endocrinology

## 2021-07-30 ENCOUNTER — Encounter: Payer: Self-pay | Admitting: Neurology

## 2021-07-31 ENCOUNTER — Ambulatory Visit (INDEPENDENT_AMBULATORY_CARE_PROVIDER_SITE_OTHER): Payer: PRIVATE HEALTH INSURANCE | Admitting: Endocrinology

## 2021-07-31 ENCOUNTER — Other Ambulatory Visit: Payer: Self-pay

## 2021-07-31 ENCOUNTER — Encounter: Payer: Self-pay | Admitting: Endocrinology

## 2021-07-31 VITALS — BP 122/84 | HR 87 | Ht 62.0 in | Wt 172.4 lb

## 2021-07-31 DIAGNOSIS — E041 Nontoxic single thyroid nodule: Secondary | ICD-10-CM | POA: Diagnosis not present

## 2021-07-31 DIAGNOSIS — D352 Benign neoplasm of pituitary gland: Secondary | ICD-10-CM | POA: Diagnosis not present

## 2021-07-31 NOTE — Progress Notes (Signed)
Per Dr.Jaffe, OK to order MRI of pituitary gland with and without contrast for evaluation of pituitary adenoma  ?

## 2021-07-31 NOTE — Progress Notes (Signed)
Patient ID: Kristin Daniels, female   DOB: March 21, 1976, 46 y.o.   MRN: [id number] ? ?       ? ? ? ?Chief complaint: Endocrinology follow-up ? ?History of Present Illness ? ? ? ?PROBLEM 1: ? ?Prolactinoma ?In 2009 she had presented to her physician with complaints of milky discharge from her breasts and headaches ?She had not had any late or missed menstrual cycles at that time ?She reportedly was diagnosed to have a prolactinoma and started on cabergoline ?She does not know what her baseline prolactin level was ? ?She had been followed by an endocrinologist in [place] state with periodic lab work and MRIs and continued on cabergoline ?Only a few reports are available from previous records and not clear if her prolactin has been consistently controlled ?In 12/16 her prolactin was 45 ?About 6 months prior to her visit in December 2018 with her PCP she was told to start taking 1/2 tablet only once a week with her Dostinex ? ?RECENT history: ?In late 2018 started having some headaches and also breast fullness and mild galactorrhea ?When her prolactin level was 29 she was told to increase her dosage to half tablet twice a week ?With relatively higher prolactin levels she is usually has symptoms of her breasts swelling, feeling full and tender.  Also would have a milky discharge on expression but no change in menstrual cycles. ? ?On her visit in 6/22 for a couple of weeks she had missed an occasional dose of her cabergoline and prolactin was high normal at 22.7 without any change in symptoms ?The current dose is of 1 tablet alternating with half tablet  ?She says her symptoms of some breast tenderness and heaviness along with the occasional breast discharge with manual expression are present to some degree ?He has not missed any doses reportedly ? ?Prolactin is now 17 but still normal ? ?Prolactin levels: ? ?Lab Results  ?Component Value Date  ? PROLACTIN 17.5 07/23/2021  ? PROLACTIN 9.1 02/22/2021  ? PROLACTIN 22.7  10/23/2020  ? PROLACTIN 17.7 06/21/2020  ? ?MRI of pituitary gland was done by neurologist in the 05/2019 which showed an 8 mm hyperintense mass in the posterior sella.  No comparison was made with prior MRI as below ? 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 ?Past history: ? ?During her pregnancy when she was not on regular medication she developed severe diabetes insipidus  which was controlled with her restarting treatment and she took initially bromocriptine and subsequently cabergoline during her pregnancy ? ? ?PROBLEM 2: ? ?Multinodular goiter. ? ?She had this diagnosed on routine exam several years ago ? ?No prior ultrasound reports are available when [place] and no description of her exam is available from previous notes.  However baseline size of the nodule on the left was 2.8 cm in 2013 ?Records have shown that she had needle aspiration biopsies of the left dominant nodule 3 times, see below.  Biopsies have been benign consistently including Afirma testing ? ?Thyroid nodule measured 4.4 cm in 04/2020 compared to 3.7 cm as of 2018 ? ?No symptoms of local pressure sensation, difficulty swallowing or choking ?She again feels that her left thyroid nodule may be growing and she may be having more than 1 nodule ? ?Previously in 11/21 her left-sided nodule was about 3-3.5 cm  on exam ? ?Her thyroid levels have been variable with generally low normal TSH levels without increase in free T4 or T3 levels ?TSH is consistently normal  ?Free T4 low normal ? ?Lab Results  ?Component Value Date  ? TSH 0.65 07/23/2021  ? TSH 0.79 02/22/2021  ? TSH 0.529 10/23/2020  ? FREET4 0.61 07/23/2021  ? FREET4 0.68 02/22/2021  ? FREET4 0.85 10/23/2020  ? ?Lab Results  ?Component Value Date  ? T3FREE 2.5 10/23/2020  ? T3FREE 3.0 03/13/2020  ?  T3FREE 3.6 09/08/2018  ? ? ? ?Previous studies:  ? ?Her first biopsy was done in 09/2011 which had shown indeterminate cytology and this was confirmed to be benign on Affirma testing ?At that time her nodule was 2.8 cm ? ?Needle aspiration done in 01/2015 indicated she had a 3 cm nodule that previously had been benign; this showed scanty cellular specimen along with colloid and degenerated macrophages ?In 08/2016 the needle aspiration biopsy showed a benign follicular adenoma and the nodule size was indicated at 3.7 cm ? ?Lab Results  ?Component Value Date  ? TSH 0.65 07/23/2021  ? TSH 0.79 02/22/2021  ? TSH 0.529 10/23/2020  ? FREET4 0.61 07/23/2021  ? FREET4 0.68 02/22/2021  ? FREET4 0.85 10/23/2020  ? ? ? ?Allergies as of 07/31/2021   ? ?   Reactions  ? Metronidazole Anaphylaxis, Hives  ? Pollen Extract   ? Diflucan [fluconazole] Rash  ? Broke out in a rash with hives around her mouth, hands, face.  ? ?  ? ?  ?Medication List  ?  ? ?  ? Accurate as of July 31, 2021  9:40 AM. If you have any questions, ask your nurse or doctor.  ?  ?  ? ?  ? ?ALPRAZolam 1 MG tablet ?Commonly known as: Duanne Moron ?Take 1 mg by mouth daily. ?  ?ARIPiprazole 30 MG tablet ?Commonly known as: ABILIFY ?Take 30 mg by mouth daily. ?  ?cabergoline 0.5 MG tablet ?Commonly known as: DOSTINEX ?TAKE 1 TAB ON SUNDAYS AND HALF TABLET ON THURSDAYS ?  ?cetirizine 10 MG tablet ?Commonly known as: ZYRTEC ?Take 1 tablet (10 mg total) by mouth daily. ?  ?clindamycin 1 % external solution ?Commonly known as: Cleocin-T ?Apply topically 2 (two) times daily as needed. ?  ?cyclobenzaprine 5 MG tablet ?Commonly known as: FLEXERIL ?Take 1 tablet (5 mg total) by mouth 3 (three) times daily as needed for muscle spasms. ?  ?desonide 0.05 % cream ?Commonly known as: DESOWEN ?Apply topically 2 (two) times daily. ?  ?FIBER PO ?Take by mouth. ?  ?FISH OIL PO ?Take by mouth. ?  ?fluticasone 50 MCG/ACT nasal spray ?Commonly known as: FLONASE ?Place 2 sprays into both  nostrils daily. ?  ?gabapentin 100 MG capsule ?Commonly known as: Neurontin ?Take 2 capsules (200 mg total) by mouth 3 (three) times daily. ?  ?hydrocortisone 2.5 % cream ?Apply topically 2 (two) times daily. ?  ?Ibrexafungerp Citrate 150 MG Tabs ?Take 2 tablets by mouth 2 (two) times daily. ?  ?MAGNESIUM PO ?Take by mouth. ?  ?Miconazole Nitrate Applicator 161 & 2 MG-% (9GM) Kit ?Place 100 mg vaginally at bedtime. ?  ?multivitamin tablet ?Take 1 tablet by mouth daily. ?  ?naproxen sodium 550 MG tablet ?Commonly known as: Anaprox DS ?Take 1 tablet (550 mg total) by mouth 2 (two) times daily as needed. ?  ?nortriptyline 50 MG capsule ?Commonly known as: PAMELOR ?Take 2 capsules (100 mg total) by mouth at bedtime. ?  ?  nystatin 100000 UNIT/ML suspension ?Commonly known as: MYCOSTATIN ?Take 5 mLs (500,000 Units total) by mouth 4 (four) times daily. ?  ?Olopatadine HCl 0.2 % Soln ?Apply 1 drop to eye daily. ?  ?omeprazole 40 MG capsule ?Commonly known as: PRILOSEC ?Take 1 capsule (40 mg total) by mouth daily. ?  ?Riboflavin-Magnesium-Feverfew 100-90-25 MG Tabs ?Take 100 mg by mouth daily. ?  ?SUMAtriptan 20 MG/ACT nasal spray ?Commonly known as: IMITREX ?1 spray in one nostril as needed.  May repeat in 2 hours if headache persists or recurs.  Maximum 2 sprays in 24 hours. ?  ?terconazole 0.4 % vaginal cream ?Commonly known as: TERAZOL 7 ?Place 1 applicator vaginally at bedtime. ?  ?Trulance 3 MG Tabs ?Generic drug: Plecanatide ?Take 1 tablet by mouth daily. ?  ?ZINC SULFATE-VITAMIN C MT ?Use as directed in the mouth or throat. ?  ?zolpidem 10 MG tablet ?Commonly known as: AMBIEN ?Take 10 mg by mouth at bedtime as needed. ?  ? ?  ? ? ?Allergies:  ?Allergies  ?Allergen Reactions  ? Metronidazole Anaphylaxis and Hives  ? Pollen Extract   ? Diflucan [Fluconazole] Rash  ?  Broke out in a rash with hives around her mouth, hands, face.  ? ? ?Past Medical History:  ?Diagnosis Date  ? Acid reflux   ? Colon polyp   ? H. pylori  infection   ? History of seasonal allergies   ? Hypercholesterolemia   ? Migraines   ? Pituitary tumor   ? Thyroid nodule   ? Vaginal Pap smear, abnormal   ? ? ?Past Surgical History:  ?Procedure Laterality Date  ? CESAREAN

## 2021-08-02 ENCOUNTER — Other Ambulatory Visit: Payer: Self-pay | Admitting: Neurology

## 2021-08-06 ENCOUNTER — Other Ambulatory Visit (HOSPITAL_COMMUNITY)
Admission: RE | Admit: 2021-08-06 | Discharge: 2021-08-06 | Disposition: A | Discharge status: Home / Self Care | Payer: PRIVATE HEALTH INSURANCE | Source: Ambulatory Visit | Attending: Obstetrics and Gynecology | Admitting: Obstetrics and Gynecology

## 2021-08-06 ENCOUNTER — Ambulatory Visit (INDEPENDENT_AMBULATORY_CARE_PROVIDER_SITE_OTHER): Payer: PRIVATE HEALTH INSURANCE | Admitting: Obstetrics and Gynecology

## 2021-08-06 VITALS — BP 119/75 | HR 89 | Wt 171.0 lb

## 2021-08-06 DIAGNOSIS — D259 Leiomyoma of uterus, unspecified: Secondary | ICD-10-CM

## 2021-08-06 DIAGNOSIS — N941 Unspecified dyspareunia: Secondary | ICD-10-CM | POA: Diagnosis not present

## 2021-08-06 DIAGNOSIS — N898 Other specified noninflammatory disorders of vagina: Secondary | ICD-10-CM

## 2021-08-06 DIAGNOSIS — R339 Retention of urine, unspecified: Secondary | ICD-10-CM | POA: Diagnosis not present

## 2021-08-06 NOTE — Progress Notes (Signed)
Obstetrics and Gynecology ?New Patient Evaluation ? ?Appointment Date: 08/06/2021 ? ?OB[facility] Clinic: [facility]  ? ?Primary Care Provider: Lyndee Hensen ? ? ?Chief Complaint:  ?Chief Complaint  ?Patient presents with  ? Ultrasound Follow Up  ? ? ?History of Present Illness: Kristin Daniels is a 46 y.o. [id number] (Patient's last menstrual period was 07/28/2021.), seen for the above chief complaint.  ? ?Patient seen by Dr. Gala Romney on 1/23 for ED follow up. During 12/19 MRI in the ED for back and abdominal pain, a 4cm simple RO cyst was seen. Dr. Barnett Abu a vaginal swab for yeast and BV and ordered a repeat u/s to follow up the cyst. The swab came back yeast+ and she sent in ibrexafungerp on 3/4. U/s showed 9-10cm sized uterus with three 1.5-2cm fibroids (see below). ? ?Patient states she never got a notification for the ibrexafungerp at the pharmacy and hasn't taken anything OTC for the yeast. She notes some dyspareunia (can't describe where) and h/o chronic incomplete bladder emptying and pt is doing at home kegel exercises.  ? ?Review of Systems: Pertinent items noted in HPI and remainder of comprehensive ROS otherwise negative.  ? ? ?Patient Active Problem List  ? Diagnosis Date Noted  ? Vaginal candidiasis 05/21/2021  ? Oral candidiasis 05/21/2021  ? Bilateral leg pain 05/21/2021  ? Lumbar radiculopathy, acute 04/10/2021  ? Costochondral chest pain 12/01/2020  ? Pain of right lower leg 12/01/2020  ? Abdominal wall fluid collections 12/01/2020  ? Seasonal allergic rhinitis due to pollen 08/28/2020  ? Acne 07/26/2020  ? Physically well but worried 07/26/2020  ? H. pylori infection 03/26/2019  ? Lymph node symptom 09/21/2018  ? Chronic idiopathic constipation 12/29/2017  ? 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 History of pituitary tumor 04/21/2017  ? History of thyroid nodule 04/21/2017  ? Enlarged thyroid gland 03/14/2017  ? Fibrocystic breast changes, bilateral 03/14/2017  ?  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 Uterine leiomyoma 03/14/2017  ? ? ? ?Past Medical History:  ?Past Medical History:  ?Diagnosis Date  ? Acid reflux   ? Colon polyp   ? H. pylori infection   ? History of seasonal allergies   ? Hypercholesterolemia   ? Migraines   ? Pituitary tumor   ? Thyroid nodule   ? Vaginal Pap smear, abnormal   ? ? ?Past Surgical History:  ?Past Surgical History:  ?Procedure Laterality Date  ? CESAREAN SECTION    ? OTHER SURGICAL HISTORY    ? hemangio removed at child and had something added to head to help indentation  ? ? ?Past Obstetrical History:  ?OB History  ?Gravida Para Term Preterm AB Living  ?$Remove'2 1   1 1 1  'MhYEaEg$ ?SAB IAB Ectopic Multiple Live Births  ?1       1  ?  ?# Outcome Date GA Lbr Len/2nd Weight Sex Delivery Anes PTL Lv  ?2 Preterm 2011 [redacted]w[redacted]d  7 lb 2 oz (3.232 kg) M CS-LTranv EPI N LIV  ?1 SAB 1995          ? ? ?Past Gynecological History: As per HPI. ?Periods: qmonth, regular, no intermenstrual bleeding, not painful, somewhat heavy, not prolonged ?History of Pap Smear(s): Yes.   Last pap 2021, which was negative ?She is currently using no method for contraception.  ? ? ?Social History:  ?Social History  ? ?Socioeconomic History  ? Marital status: Single  ?  Spouse name: Not on file  ?  Number of children: 1  ? Years of education: Not on file  ? Highest education level: Not on file  ?Occupational History  ? Occupation: not working  ?Tobacco Use  ? Smoking status: Never  ? Smokeless tobacco: Never  ?Vaping Use  ? Vaping Use: Never used  ?Substance and Sexual Activity  ? Alcohol use: Yes  ?  Comment: holidays  ? Drug use: No  ? Sexual activity: Yes  ?  Birth control/protection: Condom  ?Other Topics Concern  ? Not on file  ?Social History Narrative  ? Right handed  ? Lives in two story home with son.  ? ?Social Determinants of Health  ? ?Financial Resource Strain: Not on file  ?Food Insecurity: Not on file  ?Transportation Needs: Not on file  ?Physical Activity: Not on  file  ?Stress: Not on file  ?Social Connections: Not on file  ?Intimate Partner Violence: Not on file  ? ? ?Family History:  ?Family History  ?Problem Relation Age of Onset  ? Hypertension Mother   ? Hypercholesterolemia Mother   ? Colon polyps Maternal Grandmother   ? Colon cancer Neg Hx   ? Esophageal cancer Neg Hx   ? Stomach cancer Neg Hx   ? Rectal cancer Neg Hx   ? ? ?Medications ?Kristin Daniels had no medications administered during this visit. ?Current Outpatient Medications  ?Medication Sig Dispense Refill  ? ALPRAZolam (XANAX) 1 MG tablet Take 1 mg by mouth daily.    ? ARIPiprazole (ABILIFY) 30 MG tablet Take 30 mg by mouth daily.    ? cabergoline (DOSTINEX) 0.5 MG tablet TAKE 1 TAB ON SUNDAYS AND HALF TABLET ON THURSDAYS 18 tablet 1  ? cetirizine (ZYRTEC) 10 MG tablet Take 1 tablet (10 mg total) by mouth daily. 30 tablet 11  ? clindamycin (CLEOCIN-T) 1 % external solution Apply topically 2 (two) times daily as needed. 30 mL 2  ? cyclobenzaprine (FLEXERIL) 5 MG tablet Take 1 tablet (5 mg total) by mouth 3 (three) times daily as needed for muscle spasms. 30 tablet 0  ? desonide (DESOWEN) 0.05 % cream Apply topically 2 (two) times daily. 30 g 0  ? FIBER PO Take by mouth.    ? fluticasone (FLONASE) 50 MCG/ACT nasal spray Place 2 sprays into both nostrils daily. 16 g 6  ? hydrocortisone 2.5 % cream Apply topically 2 (two) times daily. 90 g 1  ? Ibrexafungerp Citrate 150 MG TABS Take 2 tablets by mouth 2 (two) times daily. 4 tablet 0  ? MAGNESIUM PO Take by mouth.    ? Miconazole Nitrate Applicator 854 & 2 MG-% (9GM) KIT Place 100 mg vaginally at bedtime. 1 kit 2  ? Multiple Vitamin (MULTIVITAMIN) tablet Take 1 tablet by mouth daily.    ? naproxen sodium (ANAPROX DS) 550 MG tablet Take 1 tablet (550 mg total) by mouth 2 (two) times daily as needed. 20 tablet 5  ? nortriptyline (PAMELOR) 50 MG capsule TAKE 2 CAPSULES (100 MG TOTAL) BY MOUTH AT BEDTIME. 180 capsule 2  ? nystatin (MYCOSTATIN) 100000 UNIT/ML  suspension Take 5 mLs (500,000 Units total) by mouth 4 (four) times daily. 60 mL 0  ? Olopatadine HCl 0.2 % SOLN Apply 1 drop to eye daily. 1 Bottle 0  ? Omega-3 Fatty Acids (FISH OIL PO) Take by mouth.    ? omeprazole (PRILOSEC) 40 MG capsule Take 1 capsule (40 mg total) by mouth daily. 30 capsule 0  ? Plecanatide (TRULANCE) 3 MG TABS Take 1 tablet  by mouth daily. 30 tablet 11  ? Riboflavin-Magnesium-Feverfew 100-90-25 MG TABS Take 100 mg by mouth daily. 30 tablet 0  ? SUMAtriptan (IMITREX) 20 MG/ACT nasal spray 1 spray in one nostril as needed.  May repeat in 2 hours if headache persists or recurs.  Maximum 2 sprays in 24 hours. 6 each 5  ? terconazole (TERAZOL 7) 0.4 % vaginal cream Place 1 applicator vaginally at bedtime. 45 g 0  ? ZINC SULFATE-VITAMIN C MT Use as directed in the mouth or throat.    ? zolpidem (AMBIEN) 10 MG tablet Take 10 mg by mouth at bedtime as needed.    ? gabapentin (NEURONTIN) 100 MG capsule Take 2 capsules (200 mg total) by mouth 3 (three) times daily. 180 capsule 0  ? ?No current facility-administered medications for this visit.  ? ? ?Allergies ?Metronidazole, Pollen extract, and Diflucan [fluconazole] ? ? ?Physical Exam:  ?BP 119/75   Pulse 89   Wt 171 lb (77.6 kg)   LMP 07/28/2021   BMI 31.28 kg/m?  Body mass index is 31.28 kg/m?. ?General appearance: Well nourished, well developed female in no acute distress.  ?Respiratory:  Normal respiratory effort ?Neuro/Psych:  Normal mood and affect.  ?Skin:  Warm and dry.  ? ?Laboratory:  ?As per hpi ? ?Radiology:  ?Narrative & Impression  ?CLINICAL DATA:  Pelvic and RIGHT lower quadrant pain, history of ?Caesarean section, question cyst RIGHT ovary on MRI, LMP 06/29/2021 ?  ?EXAM: ?TRANSABDOMINAL AND TRANSVAGINAL ULTRASOUND OF PELVIS ?  ?TECHNIQUE: ?Both transabdominal and transvaginal ultrasound examinations of the ?pelvis were performed. Transabdominal technique was performed for ?global imaging of the pelvis including uterus, ovaries,  adnexal ?regions, and pelvic cul-de-sac. It was necessary to proceed with ?endovaginal exam following the transabdominal exam to visualize the ?endometrium and ovaries. ?  ?COMPARISON:  None ?  ?FINDINGS: ?Uterus ?

## 2021-08-07 LAB — CERVICOVAGINAL ANCILLARY ONLY
Bacterial Vaginitis (gardnerella): NEGATIVE
Candida Glabrata: NEGATIVE
Candida Vaginitis: NEGATIVE
Chlamydia: NEGATIVE
Comment: NEGATIVE
Comment: NEGATIVE
Comment: NEGATIVE
Comment: NEGATIVE
Comment: NEGATIVE
Comment: NORMAL
Neisseria Gonorrhea: NEGATIVE
Trichomonas: NEGATIVE

## 2021-08-24 ENCOUNTER — Other Ambulatory Visit: Payer: PRIVATE HEALTH INSURANCE

## 2021-08-28 ENCOUNTER — Ambulatory Visit: Payer: Medicaid Other | Admitting: Physical Therapy

## 2021-08-28 ENCOUNTER — Ambulatory Visit: Payer: PRIVATE HEALTH INSURANCE | Admitting: Endocrinology

## 2021-08-29 ENCOUNTER — Ambulatory Visit: Payer: PRIVATE HEALTH INSURANCE | Attending: Obstetrics and Gynecology

## 2021-08-29 DIAGNOSIS — M6281 Muscle weakness (generalized): Secondary | ICD-10-CM | POA: Insufficient documentation

## 2021-08-29 DIAGNOSIS — M62838 Other muscle spasm: Secondary | ICD-10-CM | POA: Insufficient documentation

## 2021-08-29 DIAGNOSIS — R279 Unspecified lack of coordination: Secondary | ICD-10-CM | POA: Diagnosis not present

## 2021-08-29 DIAGNOSIS — R339 Retention of urine, unspecified: Secondary | ICD-10-CM | POA: Diagnosis not present

## 2021-08-29 DIAGNOSIS — N941 Unspecified dyspareunia: Secondary | ICD-10-CM | POA: Insufficient documentation

## 2021-08-29 NOTE — Patient Instructions (Signed)
Double-voiding: ? ?This technique is to help with post-void dribbling, or leaking a little bit when ?you stand up right after urinating. ? ?Use relaxed toileting mechanics to urinate as much as you feel like you ?have to without straining. ? ?Sit back upright from leaning forward and relax this way for 10-20 seconds. ? ?Lean forward again to finish voiding any amount more. ? ? ?Squatty potty: ?When your knees are level or below the level of your hips, pelvic floor ?muscles are pressed against rectum, preventing ease of bowel movement. ?By getting knees above the level of the hips, these pelvic floor muscles ?relax, allowing easier passage of bowel movement. ?? Ways to get knees above hips: ?o Physiological scientist (7inch and 9inch versions) ?o Small stool ?o Roll of toilet paper under each foot ?o Hardback book or stack of magazines under each foot ? ?Relaxed Toileting mechanics: ?Once in this position, make sure to lean forward with forearms on thighs, ?wide knees, relaxed stomach, and breathe. ? ?

## 2021-08-29 NOTE — Therapy (Signed)
?OUTPATIENT PHYSICAL THERAPY FEMALE PELVIC EVALUATION ? ? ?Patient Name: Kristin Daniels ?MRN: [id number] ?DOB:April 03, 1976, 46 y.o., female ?Today's Date: 08/29/2021 ? ? ? ?Past Medical History:  ?Diagnosis Date  ? Acid reflux   ? Colon polyp   ? H. pylori infection   ? History of seasonal allergies   ? Hypercholesterolemia   ? Migraines   ? Pituitary tumor   ? Thyroid nodule   ? Vaginal Pap smear, abnormal   ? ?Past Surgical History:  ?Procedure Laterality Date  ? CESAREAN SECTION    ? OTHER SURGICAL HISTORY    ? hemangio removed at child and had something added to head to help indentation  ? ?Patient Active Problem List  ? Diagnosis Date Noted  ? Vaginal candidiasis 05/21/2021  ? Oral candidiasis 05/21/2021  ? Bilateral leg pain 05/21/2021  ? Lumbar radiculopathy, acute 04/10/2021  ? Costochondral chest pain 12/01/2020  ? Pain of right lower leg 12/01/2020  ? Abdominal wall fluid collections 12/01/2020  ? Seasonal allergic rhinitis due to pollen 08/28/2020  ? Acne 07/26/2020  ? Physically well but worried 07/26/2020  ? H. pylori infection 03/26/2019  ? Lymph node symptom 09/21/2018  ? Chronic idiopathic constipation 12/29/2017  ? 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 History of pituitary tumor 04/21/2017  ? History of thyroid nodule 04/21/2017  ? Enlarged thyroid gland 03/14/2017  ? Fibrocystic breast changes, bilateral 03/14/2017  ? 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 Uterine leiomyoma 03/14/2017  ? ? ?PCP: Lyndee Hensen, DO ? ?REFERRING PROVIDER: Lyndee Hensen, DO ? ?REFERRING DIAG: N94.10 (ICD-10-CM) - Dyspareunia, female ?R33.9 (ICD-10-CM) - Incomplete bladder emptying ? ?THERAPY DIAG:  ?No diagnosis found. ? ?ONSET DATE: 05/06/2018 ? ?SUBJECTIVE:                                                                                                                                                                                          ? ?SUBJECTIVE STATEMENT: ?Pt states that she has been having  difficulty emptying bladder completely due to intermittent stream. She sits down to urinate, then stream stops and she has to bear down to finish. Rt lower quadrant pain not present any longer since cyst is gone. She was having low back pain and went to PT, which helped some.  ?Fluid intake: Yes: reports she drinks a lot of water - occasional alcohol and carbonated beverages   ? ?Patient confirms identification and approves PT to assess pelvic floor and treatment Yes ? ? ?PAIN:  ?Are you having pain? No ? ? ? ?PRECAUTIONS: None ? ?WEIGHT BEARING RESTRICTIONS No ? ?FALLS:  ?Has patient fallen in  last 6 months? No ? ?LIVING ENVIRONMENT: ?Lives with: lives with their family ?Lives in: House/apartment ? ? ?OCCUPATION: not currently working ? ?PLOF: Independent ? ?PATIENT GOALS decrease pain and improve bladder emptying ? ?PERTINENT HISTORY:  ?Cesarean 2011, liposuction 2022 ?Sexual abuse: No ? ?BOWEL MOVEMENT ?Pain with bowel movement: No ?Type of bowel movement:Frequency 1x/day and Strain Yes ?Fully empty rectum: Yes: - ?Leakage: No ?Pads: No ?Fiber supplement: Yes: - ? ?URINATION ?Pain with urination: No ?Fully empty bladder: No ?Stream:  intermittent, has to strain to completely empty ?Urgency: No ?Frequency: every hour ?Leakage:  none ?Pads: No ? ?INTERCOURSE ?Pain with intercourse: During Penetration ?Ability to have vaginal penetration:  Yes: she will get sharp shooting pains and dull achiness - no pain after ?Climax: no pain ? ? ?PREGNANCY ?Vaginal deliveries 0 ?Tearing No ?C-section deliveries 1 - 2011 ?Currently pregnant No ? ? ? ? ?OBJECTIVE:  ? ?DIAGNOSTIC FINDINGS:  ?MRI 04/2018 demonstrates Rt ovarian cyst 4cm ?Recent US with multiple uterine fibroids ? ?COGNITION: ? Overall cognitive status: Within functional limits for tasks assessed   ?  ?SENSATION: ? Light touch: Appears intact ? Proprioception: Appears intact ? ?MUSCLE LENGTH: ?Hamstrings: Right dec deg; Left dec deg ?Thomas test: Right WNL deg; Left  WNL deg ?Piriformis dec bil ? ? ?FUNCTIONAL TESTS:  ?Squat: lt weight shift ?Single leg stance: 4 sec bil ?GAIT: increased lumbar lordosis/stiffness ? ?POSTURE:  ?Rt facing sacrum ? ?LUMBARAROM/PROM ?Flexion 75%, extension 25% with pain bil buttocks, bil rotation 50% with lumbar tightness, bil side bend 75% with bil lumbar tightness ? ?LE ROM: ? ? ? ?LE MMT: ?  Bil grossly 3/5 ? ?PELVIC MMT: ? Not assessed today ? ? ?      PALPATION: ?  General  2 finger width diastasis with no distortion; significant cesarean scar tissue restriction; high tone in abdominals; tenderness to palpation throughout lumbar paraspinals, glutes, and lower abdomen ? ?              External Perineal Exam NA ?              ?              Internal Pelvic Floor NA ? ?TONE: ?NA ? ?PROLAPSE: ?NA ? ?TODAY'S TREATMENT 08/29/21 ?EVAL  ?Self-care: ?Squatty potty/relaxed toileting mechanics ?Double-voiding ?Intra-abdominal pressure management ?Pelvic floor/core anatomy ? ?Check all possible CPT codes: 41324 - Self Care    ? ?If treatment provided at initial evaluation, no treatment charged due to lack of authorization.    ? ? ? ?PATIENT EDUCATION:  ?Education details: See above self-care ?Person educated: Patient ?Education method: Explanation, Demonstration, Tactile cues, Verbal cues, and Handouts ?Education comprehension: verbalized understanding ? ? ?HOME EXERCISE PROGRAM: ?Written handouts today ? ?ASSESSMENT: ? ?CLINICAL IMPRESSION: ?Patient is a 46 y.o. female who was seen today for physical therapy evaluation and treatment for incomplete bladder emptying, deep dyspareunia, and low back pain. Exam findings notable for decreased and painful lumbar A/ROM, significant hip weakness, high tone in abdominal muscles and cesarean scar tissue restriction, 2 finger width diastasis without any distortion, decreased posterior chain flexibility, tenderness to palpation throughout lower abdomen/bil lumbar paraspinals/bil glutes, and decreased balance with  single leg stance; patient requested holding off on internal pelvic exam due to being on menstrual cycle. Signs and symptoms are most consistent with suspected pelvic floor muscular tension and possible anterior vaginal wall laxity; we will perform internal assessment next treatment session. Believe that decreased lumbar mobility, flexibility, hip strength, scar  tissue restriction, and high abdominal tone are also contributing to condition. Initial treatment consisted of extensive education on importance of abdominal pressure management, balancing strength and mobility in the core, squatty potty/relaxed toileting mechanics, and double-voiding. She will benefit from skilled PT intervention in order to address impairments, improve complete bladder emptying, decrease deep dyspareunia, decrease low back pain, and perform functional strengthening with appropriate abdominal pressure management.    ? ? ?OBJECTIVE IMPAIRMENTS decreased activity tolerance, decreased cognition, decreased coordination, decreased endurance, decreased mobility, decreased strength, hypomobility, increased fascial restrictions, increased muscle spasms, impaired flexibility, impaired tone, postural dysfunction, and pain.  ? ?ACTIVITY LIMITATIONS  interpersonal relationships; emptying bladder .  ? ?PERSONAL FACTORS 1-2 comorbidities: cesarean delivery 2011 and liposuction 2022  are also affecting patient's functional outcome.  ? ? ?REHAB POTENTIAL: Good ? ?CLINICAL DECISION MAKING: Evolving/moderate complexity ? ?EVALUATION COMPLEXITY: Moderate ? ? ?GOALS: ?Goals reviewed with patient? Yes ? ?SHORT TERM GOALS: Target date: 09/26/2021 ? ?Pt will be independent with HEP.  ? ?Baseline: ?Goal status: INITIAL ? ?2.  Pt will be able to correctly perform diaphragmatic breathing and appropriate pressure management in order to prevent worsening vaginal wall laxity and improve pelvic floor A/ROM.  ? ?Baseline:  ?Goal status: INITIAL ? ?3.  Pt will be able to  teach back and utilize urge suppression technique in order to help reduce number of trips to the bathroom.   ? ?Baseline:  ?Goal status: INITIAL ? ? ? ?LONG TERM GOALS: Target date: 11/21/2021 ? ?Pt will be indep

## 2021-09-05 ENCOUNTER — Other Ambulatory Visit: Payer: Medicaid Other

## 2021-09-06 ENCOUNTER — Ambulatory Visit: Payer: BLUE CROSS/BLUE SHIELD | Attending: Obstetrics and Gynecology

## 2021-09-06 DIAGNOSIS — R279 Unspecified lack of coordination: Secondary | ICD-10-CM | POA: Insufficient documentation

## 2021-09-06 DIAGNOSIS — M62838 Other muscle spasm: Secondary | ICD-10-CM | POA: Insufficient documentation

## 2021-09-06 DIAGNOSIS — M6281 Muscle weakness (generalized): Secondary | ICD-10-CM | POA: Diagnosis present

## 2021-09-06 NOTE — Therapy (Signed)
?OUTPATIENT PHYSICAL THERAPY TREATMENT NOTE ? ? ?Patient Name: Kristin Daniels ?MRN: [id number] ?DOB:12-Mar-1976, 46 y.o., female ?Today's Date: 09/06/2021 ? ?PCP: Lyndee Hensen, DO ?REFERRING PROVIDER: Aletha Halim, MD ? ?END OF SESSION:  ? PT End of Session - 09/06/21 1105   ? ? Visit Number 2   ? Date for PT Re-Evaluation 11/21/21   ? Authorization Type CCME   ? Authorization Time Period 09/04/21-11/26/21   ? Authorization - Visit Number 1   ? Authorization - Number of Visits 12   ? PT Start Time 1105   ? PT Stop Time 1148   ? PT Time Calculation (min) 43 min   ? Activity Tolerance Patient tolerated treatment well   ? Behavior During Therapy [facility] for tasks assessed/performed   ? ?  ?  ? ?  ? ? ?Past Medical History:  ?Diagnosis Date  ? Acid reflux   ? Colon polyp   ? H. pylori infection   ? History of seasonal allergies   ? Hypercholesterolemia   ? Migraines   ? Pituitary tumor   ? Thyroid nodule   ? Vaginal Pap smear, abnormal   ? ?Past Surgical History:  ?Procedure Laterality Date  ? CESAREAN SECTION    ? OTHER SURGICAL HISTORY    ? hemangio removed at child and had something added to head to help indentation  ? ?Patient Active Problem List  ? Diagnosis Date Noted  ? Vaginal candidiasis 05/21/2021  ? Oral candidiasis 05/21/2021  ? Bilateral leg pain 05/21/2021  ? Lumbar radiculopathy, acute 04/10/2021  ? Costochondral chest pain 12/01/2020  ? Pain of right lower leg 12/01/2020  ? Abdominal wall fluid collections 12/01/2020  ? Seasonal allergic rhinitis due to pollen 08/28/2020  ? Acne 07/26/2020  ? Physically well but worried 07/26/2020  ? H. pylori infection 03/26/2019  ? Lymph node symptom 09/21/2018  ? Chronic idiopathic constipation 12/29/2017  ? 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 History of pituitary tumor 04/21/2017  ? History of thyroid nodule 04/21/2017  ? Enlarged thyroid gland 03/14/2017  ? Fibrocystic breast changes, bilateral 03/14/2017  ? 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  Uterine leiomyoma 03/14/2017  ? ? ?REFERRING DIAG: N94.10 (ICD-10-CM) - Dyspareunia, female ?R33.9 (ICD-10-CM) - Incomplete bladder emptying ? ?THERAPY DIAG:  ?Other muscle spasm ? ?Muscle weakness (generalized) ? ?Unspecified lack of coordination ? ?PERTINENT HISTORY: Cesarean 2011, liposuction 2022 ? ?PRECAUTIONS: NA ? ?SUBJECTIVE: Pt states that she is working hard on double voiding, but has to remind herself to do it. She feels like it may be helpful.  ? ?PAIN:  ?Are you having pain? Yes- cramping in lower abdomen ? ?SUBJECTIVE STATEMENT 08/29/21: ?Pt states that she has been having difficulty emptying bladder completely due to intermittent stream. She sits down to urinate, then stream stops and she has to bear down to finish. Rt lower quadrant pain not present any longer since cyst is gone. She was having low back pain and went to PT, which helped some.  ?Fluid intake: Yes: reports she drinks a lot of water - occasional alcohol and carbonated beverages   ?  ?Patient confirms identification and approves PT to assess pelvic floor and treatment Yes ?  ?  ?PAIN:  ?Are you having pain? No ?  ?  ?  ?PRECAUTIONS: None ?  ?WEIGHT BEARING RESTRICTIONS No ?  ?FALLS:  ?Has patient fallen in last 6 months? No ?  ?LIVING ENVIRONMENT: ?Lives with: lives with their family ?Lives in:  House/apartment ?  ?  ?OCCUPATION: not currently working ?  ?PLOF: Independent ?  ?PATIENT GOALS decrease pain and improve bladder emptying ?  ?PERTINENT HISTORY:  ? ?Sexual abuse: No ?  ?BOWEL MOVEMENT ?Pain with bowel movement: No ?Type of bowel movement:Frequency 1x/day and Strain Yes ?Fully empty rectum: Yes: - ?Leakage: No ?Pads: No ?Fiber supplement: Yes: - ?  ?URINATION ?Pain with urination: No ?Fully empty bladder: No ?Stream:  intermittent, has to strain to completely empty ?Urgency: No ?Frequency: every hour ?Leakage:  none ?Pads: No ?  ?INTERCOURSE ?Pain with intercourse: During Penetration ?Ability to have vaginal penetration:  Yes: she  will get sharp shooting pains and dull achiness - no pain after ?Cl[place]ax: no pain ?  ?  ?PREGNANCY ?Vaginal deliveries 0 ?Tearing No ?C-section deliveries 1 - 2011 ?Currently pregnant No ?  ?  ?  ?  ?OBJECTIVE 08/29/21:  ?  ?DIAGNOSTIC FINDINGS:  ?MRI 04/2018 demonstrates Rt ovarian cyst 4cm ?Recent US with multiple uterine fibroids ?  ?COGNITION: ?           Overall cognitive status: Within functional limits for tasks assessed              ?            ?SENSATION: ?           Light touch: Appears intact ?           Proprioception: Appears intact ?  ?MUSCLE LENGTH: ?Hamstrings: Right dec deg; Left dec deg ?Thomas test: Right WNL deg; Left WNL deg ?Piriformis dec bil ?  ?  ?FUNCTIONAL TESTS:  ?Squat: lt weight shift ?Single leg stance: 4 sec bil ?GAIT: increased lumbar lordosis/stiffness ?  ?POSTURE:  ?Rt facing sacrum ?  ?LUMBARAROM/PROM ?Flexion 75%, extension 25% with pain bil buttocks, bil rotation 50% with lumbar tightness, bil side bend 75% with bil lumbar tightness ?  ?LE ROM: ?  ?  ?  ?LE MMT: ?                        Bil grossly 3/5 ?  ?PELVIC MMT: ? Not assessed today ?  ?  ?      PALPATION: ?  General  2 finger width diastasis with no distortion; significant cesarean scar tissue restriction; high tone in abdominals; tenderness to palpation throughout lumbar paraspinals, glutes, and lower abdomen ?  ?              External Perineal Exam NA ?              ?              Internal Pelvic Floor NA ?  ?TONE: ?NA ?  ?PROLAPSE: ?NA ?  ?TODAY'S TREATMENT 09/06/21 ?Manual: ?Soft tissue mobilization: ?Scar tissue mobilization: ?Myofascial release: ?Abdominal scar tissue ?Spinal mobilization: ?Internal pelvic floor techniques: ?Dry needling: ?Neuromuscular re-education: ?Core retraining:  ?Core facilitation: ?Form correction: ?Pelvic floor contraction training: ?Down training: ?Exercises: ?Stretches/mobility: ?Lower trunk rotation 3 x 10 ?Bent knee fall out 10x bil ?Cat/cow 2 x 10 ?Strengthening: ?Bridge (emphasis on  abdominal opening) ?Therapeutic activities: ?Functional strengthening activities: ?Self-care: ?Abdominal pressure management, impact of belly binder on pressure/pelvic floor ?Role of scar tissue in core function ? ? ?TREATMENT 08/29/21 ?EVAL  ?Self-care: ?Squatty potty/relaxed toileting mechanics ?Double-voiding ?Intra-abdominal pressure management ?Pelvic floor/core anatomy ?  ?Check all possible CPT codes: 58527 - Self Care                              ?  ?  If treatment provided at initial evaluation, no treatment charged due to lack of authorization.                          ?  ?  ?  ?PATIENT EDUCATION:  ?Education details: See above self-care ?Person educated: Patient ?Education method: Explanation, Demonstration, Tactile cues, Verbal cues, and Handouts ?Education comprehension: verbalized understanding ?  ?  ?HOME EXERCISE PROGRAM: ?[id number] ?  ?ASSESSMENT: ?  ?CLINICAL IMPRESSION: ?Pt has seen no changes since first treatment session.  No internal exam performed to assess pelvic floor this treatment session due to patient request; we will plan for next treatment session. Manual techniques performed to abdomen  to help improve myofascial/scar tissue mobility with good release. We discussed impact of wearing belly binder and how it can alter pressure mechanics in abdomen/pelvic floor. She did well with all exercises to help being working on mobility on abdominal wall. She will continue to benefit from skilled PT intervention in order to address impairments, improve complete bladder emptying, decrease deep dyspareunia, decrease low back pain, and perform functional strengthening with appropriate abdominal pressure management.    ?  ?  ?OBJECTIVE IMPAIRMENTS decreased activity tolerance, decreased cognition, decreased coordination, decreased endurance, decreased mobility, decreased strength, hypomobility, increased fascial restrictions, increased muscle spasms, impaired flexibility, impaired tone, postural  dysfunction, and pain.  ?  ?ACTIVITY LIMITATIONS  interpersonal relationships; emptying bladder .  ?  ?PERSONAL FACTORS 1-2 comorbidities: cesarean delivery 2011 and liposuction 2022  are also affecting patient's func

## 2021-09-10 ENCOUNTER — Ambulatory Visit: Payer: Medicaid Other

## 2021-09-10 NOTE — Progress Notes (Deleted)
   SUBJECTIVE:   CHIEF COMPLAINT / HPI:   No chief complaint on file.    Kristin Daniels is a 46 y.o. female here for ***   Pt reports ***    PERTINENT  PMH / PSH: reviewed and updated as appropriate   OBJECTIVE:   There were no vitals taken for this visit.  ***  ASSESSMENT/PLAN:   No problem-specific Assessment & Plan notes found for this encounter.     Lyndee Hensen, DO PGY-3, [facility] 09/10/2021      {    This will disappear when note is signed, click to select method of visit    :1}

## 2021-09-12 ENCOUNTER — Encounter: Payer: Self-pay | Admitting: Physical Therapy

## 2021-09-13 ENCOUNTER — Ambulatory Visit: Payer: BLUE CROSS/BLUE SHIELD

## 2021-09-13 ENCOUNTER — Ambulatory Visit: Payer: Medicaid Other

## 2021-09-13 DIAGNOSIS — M62838 Other muscle spasm: Secondary | ICD-10-CM | POA: Diagnosis not present

## 2021-09-13 DIAGNOSIS — R279 Unspecified lack of coordination: Secondary | ICD-10-CM

## 2021-09-13 DIAGNOSIS — M6281 Muscle weakness (generalized): Secondary | ICD-10-CM

## 2021-09-13 NOTE — Therapy (Signed)
?OUTPATIENT PHYSICAL THERAPY TREATMENT NOTE ? ? ?Patient Name: Kristin Daniels ?MRN: [id number] ?DOB:[birth year], 46 y.o., female ?Today's Date: 09/13/2021 ? ?PCP: Lyndee Hensen, DO ?REFERRING PROVIDER: Aletha Halim, MD ? ?END OF SESSION:  ? PT End of Session - 09/13/21 1016   ? ? Visit Number 3   ? Date for PT Re-Evaluation 11/21/21   ? Authorization Type CCME   ? Authorization Time Period 09/04/21-11/26/21   ? Authorization - Visit Number 2   ? Authorization - Number of Visits 12   ? PT Start Time 1015   ? PT Stop Time 1055   ? PT Time Calculation (min) 40 min   ? Activity Tolerance Patient tolerated treatment well   ? Behavior During Therapy [facility] for tasks assessed/performed   ? ?  ?  ? ?  ? ? ? ?Past Medical History:  ?Diagnosis Date  ? Acid reflux   ? Colon polyp   ? H. pylori infection   ? History of seasonal allergies   ? Hypercholesterolemia   ? Migraines   ? Pituitary tumor   ? Thyroid nodule   ? Vaginal Pap smear, abnormal   ? ?Past Surgical History:  ?Procedure Laterality Date  ? CESAREAN SECTION    ? OTHER SURGICAL HISTORY    ? hemangio removed at child and had something added to head to help indentation  ? ?Patient Active Problem List  ? Diagnosis Date Noted  ? Vaginal candidiasis 05/21/2021  ? Oral candidiasis 05/21/2021  ? Bilateral leg pain 05/21/2021  ? Lumbar radiculopathy, acute 04/10/2021  ? Costochondral chest pain 12/01/2020  ? Pain of right lower leg 12/01/2020  ? Abdominal wall fluid collections 12/01/2020  ? Seasonal allergic rhinitis due to pollen 08/28/2020  ? Acne 07/26/2020  ? Physically well but worried 07/26/2020  ? H. pylori infection 03/26/2019  ? Lymph node symptom 09/21/2018  ? Chronic idiopathic constipation 12/29/2017  ? 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 History of pituitary tumor 04/21/2017  ? History of thyroid nodule 04/21/2017  ? Enlarged thyroid gland 03/14/2017  ? Fibrocystic breast changes, bilateral 03/14/2017  ? 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  Uterine leiomyoma 03/14/2017  ? ? ?REFERRING DIAG: N94.10 (ICD-10-CM) - Dyspareunia, female ?R33.9 (ICD-10-CM) - Incomplete bladder emptying ? ?THERAPY DIAG:  ?Other muscle spasm ? ?Muscle weakness (generalized) ? ?Unspecified lack of coordination ? ?PERTINENT HISTORY: Cesarean 2011, liposuction 2022 ? ?PRECAUTIONS: NA ? ?SUBJECTIVE: Pt states that abdominal cramping is feeling better this week.  ? ?PAIN:  ?Are you having pain? Yes- cramping in lower abdomen ? ?SUBJECTIVE STATEMENT 08/29/21: ?Pt states that she has been having difficulty emptying bladder completely due to intermittent stream. She sits down to urinate, then stream stops and she has to bear down to finish. Rt lower quadrant pain not present any longer since cyst is gone. She was having low back pain and went to PT, which helped some.  ?Fluid intake: Yes: reports she drinks a lot of water - occasional alcohol and carbonated beverages   ?  ?Patient confirms identification and approves PT to assess pelvic floor and treatment Yes ?  ?  ?PAIN:  ?Are you having pain? No ?  ?  ?  ?PRECAUTIONS: None ?  ?WEIGHT BEARING RESTRICTIONS No ?  ?FALLS:  ?Has patient fallen in last 6 months? No ?  ?LIVING ENVIRONMENT: ?Lives with: lives with their family ?Lives in: House/apartment ?  ?  ?OCCUPATION: not currently working ?  ?PLOF: Independent ?  ?  PATIENT GOALS decrease pain and improve bladder emptying ?  ?PERTINENT HISTORY:  ? ?Sexual abuse: No ?  ?BOWEL MOVEMENT ?Pain with bowel movement: No ?Type of bowel movement:Frequency 1x/day and Strain Yes ?Fully empty rectum: Yes: - ?Leakage: No ?Pads: No ?Fiber supplement: Yes: - ?  ?URINATION ?Pain with urination: No ?Fully empty bladder: No ?Stream:  intermittent, has to strain to completely empty ?Urgency: No ?Frequency: every hour ?Leakage:  none ?Pads: No ?  ?INTERCOURSE ?Pain with intercourse: During Penetration ?Ability to have vaginal penetration:  Yes: she will get sharp shooting pains and dull achiness - no pain  after ?Climax: no pain ?  ?  ?PREGNANCY ?Vaginal deliveries 0 ?Tearing No ?C-section deliveries 1 - 2011 ?Currently pregnant No ?  ?  ?  ?  ?OBJECTIVE 09/13/21: Minimal anterior vaginal wall laxity, pelvic floor strength 2-3/5 with poor coordination and relaxation, vaginal dryness, discomfort and restriction with palpation of bil levator ani and sphincter urethrae.  ? ? ?08/29/21:  ?  ?DIAGNOSTIC FINDINGS:  ?MRI 04/2018 demonstrates Rt ovarian cyst 4cm ?Recent US with multiple uterine fibroids ?  ?COGNITION: ?           Overall cognitive status: Within functional limits for tasks assessed              ?            ?SENSATION: ?           Light touch: Appears intact ?           Proprioception: Appears intact ?  ?MUSCLE LENGTH: ?Hamstrings: Right dec deg; Left dec deg ?Thomas test: Right WNL deg; Left WNL deg ?Piriformis dec bil ?  ?  ?FUNCTIONAL TESTS:  ?Squat: lt weight shift ?Single leg stance: 4 sec bil ?GAIT: increased lumbar lordosis/stiffness ?  ?POSTURE:  ?Rt facing sacrum ?  ?LUMBARAROM/PROM ?Flexion 75%, extension 25% with pain bil buttocks, bil rotation 50% with lumbar tightness, bil side bend 75% with bil lumbar tightness ?  ?LE ROM: ?  ?  ?  ?LE MMT: ?                        Bil grossly 3/5 ?  ?PELVIC MMT: ? Not assessed today ?  ?  ?      PALPATION: ?  General  2 finger width diastasis with no distortion; significant cesarean scar tissue restriction; high tone in abdominals; tenderness to palpation throughout lumbar paraspinals, glutes, and lower abdomen ?  ?              External Perineal Exam NA ?              ?              Internal Pelvic Floor NA ?  ?TONE: ?NA ?  ?PROLAPSE: ?NA ?  ?TODAY'S TREATMENT 09/13/21: ?Manual: ?Soft tissue mobilization: ?Scar tissue mobilization: ?Myofascial release: ?Spinal mobilization: ?Internal pelvic floor techniques: ?No emotional/communication barriers or cognitive limitation. Patient is motivated to learn. Patient understands and agrees with treatment goals and plan. PT  explains patient will be examined in standing, sitting, and lying down to see how their muscles and joints work. When they are ready, they will be asked to remove their underwear so PT can examine their perineum. The patient is also given the option of providing their own chaperone as one is not provided in our facility. The patient also has the right and is explained the right to  defer or refuse any part of the evaluation or treatment including the internal exam. With the patient's consent, PT will use one gloved finger to gently assess the muscles of the pelvic floor, seeing how well it contracts and relaxes and if there is muscle symmetry. After, the patient will get dressed and PT and patient will discuss exam findings and plan of care. PT and patient discuss plan of care, schedule, attendance policy and HEP activities. ?Bil deep pelvic floor muscle release and bil urethrae sphincter release ?Dry needling: ?Neuromuscular re-education: ?Core retraining:  ?Core facilitation: ?Form correction: ?Pelvic floor contraction training: ?Down training: ?Diaphragmatic breathing with internal feedback for improved pelvic floor muscle release - extensive discussion about how to allow abdominals to relax and expand as well to improve pelvic floor release.  ?Improving awareness of abdominal/pelvic floor clenching ?Exercises: ?Stretches/mobility: ?Strengthening: ?Therapeutic activities: ?Functional strengthening activities: ?Self-care: ?Pelvic floor anatomy ?Prolapse anatomy ?Intra-abdominal pressure management ?What an internal pelvic exam entails in physical therapy why it's beneficial to perform ?Lubricant and moisturizer samples given ? ? ?TREATMENT 09/06/21 ?Manual: ?Soft tissue mobilization: ?Scar tissue mobilization: ?Myofascial release: ?Abdominal scar tissue ?Spinal mobilization: ?Internal pelvic floor techniques: ?Dry needling: ?Neuromuscular re-education: ?Core retraining:  ?Core facilitation: ?Form correction: ?Pelvic  floor contraction training: ?Down training: ?Exercises: ?Stretches/mobility: ?Lower trunk rotation 3 x 10 ?Bent knee fall out 10x bil ?Cat/cow 2 x 10 ?Strengthening: ?Bridge (emphasis on abdominal openin

## 2021-09-17 ENCOUNTER — Encounter: Payer: Self-pay | Admitting: Physical Therapy

## 2021-09-18 ENCOUNTER — Ambulatory Visit: Payer: BLUE CROSS/BLUE SHIELD

## 2021-09-18 ENCOUNTER — Ambulatory Visit: Payer: Medicaid Other | Admitting: Family Medicine

## 2021-09-18 DIAGNOSIS — R279 Unspecified lack of coordination: Secondary | ICD-10-CM

## 2021-09-18 DIAGNOSIS — M62838 Other muscle spasm: Secondary | ICD-10-CM

## 2021-09-18 DIAGNOSIS — M6281 Muscle weakness (generalized): Secondary | ICD-10-CM

## 2021-09-18 NOTE — Therapy (Signed)
?OUTPATIENT PHYSICAL THERAPY TREATMENT NOTE ? ? ?Patient Name: Kristin Daniels ?MRN: [id number] ?DOB:1975/07/25, 46 y.o., female ?Today's Date: 09/18/2021 ? ?PCP: Lyndee Hensen, DO ?REFERRING PROVIDER: Aletha Halim, MD ? ?END OF SESSION:  ? PT End of Session - 09/18/21 1542   ? ? Visit Number 4   ? Date for PT Re-Evaluation 11/21/21   ? Authorization Type CCME   ? Authorization Time Period 09/04/21-11/26/21   ? Authorization - Visit Number 3   ? Authorization - Number of Visits 12   ? PT Start Time 1541   ? PT Stop Time 1612   ? PT Time Calculation (min) 31 min   ? Activity Tolerance Patient tolerated treatment well   ? Behavior During Therapy [facility] for tasks assessed/performed   ? ?  ?  ? ?  ? ? ? ? ?Past Medical History:  ?Diagnosis Date  ? Acid reflux   ? Colon polyp   ? H. pylori infection   ? History of seasonal allergies   ? Hypercholesterolemia   ? Migraines   ? Pituitary tumor   ? Thyroid nodule   ? Vaginal Pap smear, abnormal   ? ?Past Surgical History:  ?Procedure Laterality Date  ? CESAREAN SECTION    ? OTHER SURGICAL HISTORY    ? hemangio removed at child and had something added to head to help indentation  ? ?Patient Active Problem List  ? Diagnosis Date Noted  ? Vaginal candidiasis 05/21/2021  ? Oral candidiasis 05/21/2021  ? Bilateral leg pain 05/21/2021  ? Lumbar radiculopathy, acute 04/10/2021  ? Costochondral chest pain 12/01/2020  ? Pain of right lower leg 12/01/2020  ? Abdominal wall fluid collections 12/01/2020  ? Seasonal allergic rhinitis due to pollen 08/28/2020  ? Acne 07/26/2020  ? Physically well but worried 07/26/2020  ? H. pylori infection 03/26/2019  ? Lymph node symptom 09/21/2018  ? Chronic idiopathic constipation 12/29/2017  ? 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 History of pituitary tumor 04/21/2017  ? History of thyroid nodule 04/21/2017  ? Enlarged thyroid gland 03/14/2017  ? Fibrocystic breast changes, bilateral 03/14/2017  ? 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  Uterine leiomyoma 03/14/2017  ? ? ?REFERRING DIAG: N94.10 (ICD-10-CM) - Dyspareunia, female ?R33.9 (ICD-10-CM) - Incomplete bladder emptying ? ?THERAPY DIAG:  ?Other muscle spasm ? ?Muscle weakness (generalized) ? ?Unspecified lack of coordination ? ?PERTINENT HISTORY: Cesarean 2011, liposuction 2022 ? ?PRECAUTIONS: NA ? ?SUBJECTIVE: Pt states that she is beginning to notice improvement in ability to urinate without having to bear down.  ? ?PAIN:  ?Are you having pain? Yes- cramping in lower abdomen ? ?SUBJECTIVE STATEMENT 08/29/21: ?Pt states that she has been having difficulty emptying bladder completely due to intermittent stream. She sits down to urinate, then stream stops and she has to bear down to finish. Rt lower quadrant pain not present any longer since cyst is gone. She was having low back pain and went to PT, which helped some.  ?Fluid intake: Yes: reports she drinks a lot of water - occasional alcohol and carbonated beverages   ?  ?Patient confirms identification and approves PT to assess pelvic floor and treatment Yes ?  ?  ?PAIN:  ?Are you having pain? No ?  ?  ?  ?PRECAUTIONS: None ?  ?WEIGHT BEARING RESTRICTIONS No ?  ?FALLS:  ?Has patient fallen in last 6 months? No ?  ?LIVING ENVIRONMENT: ?Lives with: lives with their family ?Lives in: House/apartment ?  ?  ?  OCCUPATION: not currently working ?  ?PLOF: Independent ?  ?PATIENT GOALS decrease pain and improve bladder emptying ?  ?PERTINENT HISTORY:  ? ?Sexual abuse: No ?  ?BOWEL MOVEMENT ?Pain with bowel movement: No ?Type of bowel movement:Frequency 1x/day and Strain Yes ?Fully empty rectum: Yes: - ?Leakage: No ?Pads: No ?Fiber supplement: Yes: - ?  ?URINATION ?Pain with urination: No ?Fully empty bladder: No ?Stream:  intermittent, has to strain to completely empty ?Urgency: No ?Frequency: every hour ?Leakage:  none ?Pads: No ?  ?INTERCOURSE ?Pain with intercourse: During Penetration ?Ability to have vaginal penetration:  Yes: she will get sharp  shooting pains and dull achiness - no pain after ?Climax: no pain ?  ?  ?PREGNANCY ?Vaginal deliveries 0 ?Tearing No ?C-section deliveries 1 - 2011 ?Currently pregnant No ?  ?  ?  ?  ?OBJECTIVE 09/13/21: Minimal anterior vaginal wall laxity, pelvic floor strength 2-3/5 with poor coordination and relaxation, vaginal dryness, discomfort and restriction with palpation of bil levator ani and sphincter urethrae.  ? ? ?08/29/21:  ?  ?DIAGNOSTIC FINDINGS:  ?MRI 04/2018 demonstrates Rt ovarian cyst 4cm ?Recent US with multiple uterine fibroids ?  ?COGNITION: ?           Overall cognitive status: Within functional limits for tasks assessed              ?            ?SENSATION: ?           Light touch: Appears intact ?           Proprioception: Appears intact ?  ?MUSCLE LENGTH: ?Hamstrings: Right dec deg; Left dec deg ?Thomas test: Right WNL deg; Left WNL deg ?Piriformis dec bil ?  ?  ?FUNCTIONAL TESTS:  ?Squat: lt weight shift ?Single leg stance: 4 sec bil ?GAIT: increased lumbar lordosis/stiffness ?  ?POSTURE:  ?Rt facing sacrum ?  ?LUMBARAROM/PROM ?Flexion 75%, extension 25% with pain bil buttocks, bil rotation 50% with lumbar tightness, bil side bend 75% with bil lumbar tightness ?  ?LE ROM: ?  ?  ?  ?LE MMT: ?                        Bil grossly 3/5 ?  ?PELVIC MMT: ? Not assessed today ?  ?  ?      PALPATION: ?  General  2 finger width diastasis with no distortion; significant cesarean scar tissue restriction; high tone in abdominals; tenderness to palpation throughout lumbar paraspinals, glutes, and lower abdomen ?  ?              External Perineal Exam NA ?              ?              Internal Pelvic Floor NA ?  ?TONE: ?NA ?  ?PROLAPSE: ?NA ?  ?TODAY'S TREATMENT 09/18/21: ?Manual: ?Soft tissue mobilization: ?Scar tissue mobilization: ?Abdominal scar tissue mobilization ?Myofascial release: ?External bladder release ?Spinal mobilization: ?Internal pelvic floor techniques: ?No emotional/communication barriers or cognitive  limitation. Patient is motivated to learn. Patient understands and agrees with treatment goals and plan. PT explains patient will be examined in standing, sitting, and lying down to see how their muscles and joints work. When they are ready, they will be asked to remove their underwear so PT can examine their perineum. The patient is also given the option of providing their own chaperone as one is  not provided in our facility. The patient also has the right and is explained the right to defer or refuse any part of the evaluation or treatment including the internal exam. With the patient's consent, PT will use one gloved finger to gently assess the muscles of the pelvic floor, seeing how well it contracts and relaxes and if there is muscle symmetry. After, the patient will get dressed and PT and patient will discuss exam findings and plan of care. PT and patient discuss plan of care, schedule, attendance policy and HEP activities. ?Bil deep pelvic floor muscle release and bil urethrae sphincter release ?Dry needling: ?Neuromuscular re-education: ?Core retraining:  ?Core facilitation: ?Form correction: ?Pelvic floor contraction training: ?Down training: ?Exercises: ?Stretches/mobility: ?Open books 10x bil ?Hip flexor stretch 60 sec bil ?Strengthening: ?Therapeutic activities: ?Functional strengthening activities: ?Self-care: ? ? ? ?TREATMENT 09/13/21: ?Manual: ?Soft tissue mobilization: ?Scar tissue mobilization: ?Myofascial release: ?Spinal mobilization: ?Internal pelvic floor techniques: ?No emotional/communication barriers or cognitive limitation. Patient is motivated to learn. Patient understands and agrees with treatment goals and plan. PT explains patient will be examined in standing, sitting, and lying down to see how their muscles and joints work. When they are ready, they will be asked to remove their underwear so PT can examine their perineum. The patient is also given the option of providing their own  chaperone as one is not provided in our facility. The patient also has the right and is explained the right to defer or refuse any part of the evaluation or treatment including the internal exam. With the patien

## 2021-10-04 ENCOUNTER — Ambulatory Visit: Payer: PRIVATE HEALTH INSURANCE | Attending: Obstetrics and Gynecology

## 2021-10-04 DIAGNOSIS — M6281 Muscle weakness (generalized): Secondary | ICD-10-CM | POA: Insufficient documentation

## 2021-10-04 DIAGNOSIS — R279 Unspecified lack of coordination: Secondary | ICD-10-CM | POA: Insufficient documentation

## 2021-10-04 DIAGNOSIS — M62838 Other muscle spasm: Secondary | ICD-10-CM | POA: Diagnosis present

## 2021-10-04 NOTE — Therapy (Signed)
OUTPATIENT PHYSICAL THERAPY TREATMENT NOTE   Patient Name: Kristin Daniels MRN: [id number] DOB:June 08, 1975, 46 y.o., female Today's Date: 10/04/2021  PCP: Lyndee Hensen, DO REFERRING PROVIDER: Aletha Halim, MD  END OF SESSION:   PT End of Session - 10/04/21 1100     Visit Number 5    Date for PT Re-Evaluation 11/21/21    Authorization Type [employer]    Authorization - Visit Number 4    Authorization - Number of Visits 12    PT Start Time 1100    PT Stop Time 1140    PT Time Calculation (min) 40 min    Activity Tolerance Patient tolerated treatment well    Behavior During Therapy WFL for tasks assessed/performed               Past Medical History:  Diagnosis Date   Acid reflux    Colon polyp    H. pylori infection    History of seasonal allergies    Hypercholesterolemia    Migraines    Pituitary tumor    Thyroid nodule    Vaginal Pap smear, abnormal    Past Surgical History:  Procedure Laterality Date   CESAREAN SECTION     OTHER SURGICAL HISTORY     hemangio removed at child and had something added to head to help indentation   Patient Active Problem List   Diagnosis Date Noted   Vaginal candidiasis 05/21/2021   Oral candidiasis 05/21/2021   Bilateral leg pain 05/21/2021   Lumbar radiculopathy, acute 04/10/2021   Costochondral chest pain 12/01/2020   Pain of right lower leg 12/01/2020   Abdominal wall fluid collections 12/01/2020   Seasonal allergic rhinitis due to pollen 08/28/2020   Acne 07/26/2020   Physically well but worried 07/26/2020   H. 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 She feels like urinating continues to become a little bit easier.   PAIN:  Are you having pain? Yes- cramping in lower abdomen  SUBJECTIVE STATEMENT 08/29/21: Pt states that she has been having difficulty emptying bladder completely due to intermittent stream. She sits down to urinate, then stream stops and she has to bear down to finish. Rt lower quadrant pain not present any longer since cyst is gone. She was having low back pain and went to PT, which helped some.  Fluid intake: Yes: reports she drinks a lot of water - occasional alcohol and carbonated beverages     Patient confirms identification and approves PT to assess pelvic floor and treatment Yes     PAIN:  Are you having pain? No       PRECAUTIONS: None   WEIGHT BEARING RESTRICTIONS No   FALLS:  Has patient fallen in last 6 months? No   LIVING ENVIRONMENT: Lives with: lives with their family Lives in: House/apartment  OCCUPATION: not currently working   PLOF: Independent   PATIENT GOALS decrease pain and improve bladder emptying   PERTINENT HISTORY:   Sexual abuse: No   BOWEL MOVEMENT Pain with bowel movement: No Type of bowel movement:Frequency 1x/day and Strain Yes Fully empty rectum: Yes: - Leakage: No Pads: No Fiber supplement: Yes: -   URINATION Pain with urination: No Fully empty bladder: No Stream:  intermittent, has to strain to completely empty Urgency: No Frequency: every hour Leakage:  none Pads: No   INTERCOURSE Pain with intercourse: During Penetration Ability to have vaginal  penetration:  Yes: she will get sharp shooting pains and dull achiness - no pain after [place]: no pain     PREGNANCY Vaginal deliveries 0 Tearing No C-section deliveries 1 - 2011 Currently pregnant No         OBJECTIVE 09/13/21: Minimal anterior vaginal wall laxity, pelvic floor strength 2-3/5 with poor coordination and relaxation, vaginal dryness, discomfort and restriction with palpation of bil levator ani and sphincter urethrae.    08/29/21:    DIAGNOSTIC FINDINGS:  MRI 04/2018 demonstrates Rt ovarian cyst 4cm Recent US with multiple uterine fibroids   COGNITION:            Overall cognitive status: Within functional limits for tasks assessed                          SENSATION:            Light touch: Appears intact            Proprioception: Appears intact   MUSCLE LENGTH: Hamstrings: Right dec deg; Left dec deg Marcello Moores test: Right WNL deg; Left WNL deg Piriformis dec bil     FUNCTIONAL TESTS:  Squat: lt weight shift Single leg stance: 4 sec bil GAIT: increased lumbar lordosis/stiffness   POSTURE:  Rt facing sacrum   LUMBARAROM/PROM Flexion 75%, extension 25% with pain bil buttocks, bil rotation 50% with lumbar tightness, bil side bend 75% with bil lumbar tightness   LE ROM:       LE MMT:                         Bil grossly 3/5   PELVIC MMT:  Not assessed today           PALPATION:   General  2 finger width diastasis with no distortion; significant cesarean scar tissue restriction; high tone in abdominals; tenderness to palpation throughout lumbar paraspinals, glutes, and lower abdomen                 External Perineal Exam NA                             Internal Pelvic Floor NA   TONE: NA   PROLAPSE: NA   TODAY'S TREATMENT 10/04/21 Neuromuscular re-education: Pelvic floor contraction training: Pelvic floor bulge/contraction training in seated with towel roll for feedback - used diaphragmatic breathing to help improve pelvic floor  relaxation/expansion with inhale Down training: Diaphragmatic breathing in various positions to improve motor control and rib/abdominal excursion  Exercises: Stretches/mobility: Child's pose with lateral pull and manual to help improve mobility with breathing 2 x 10 breaths each direction Sidelying stretch with arm overhead and lying on bolster  - manual cues  Self-care: Double voiding Pelvic floor anatomy: reverse kegel vs kegel   TREATMENT 09/18/21: Manual: Soft  tissue mobilization: Scar tissue mobilization: Abdominal scar tissue mobilization Myofascial release: External bladder release Spinal mobilization: Internal pelvic floor techniques: No emotional/communication barriers or cognitive limitation. Patient is motivated to learn. Patient understands and agrees with treatment goals and plan. PT explains patient will be examined in standing, sitting, and lying down to see how their muscles and joints work. When they are ready, they will be asked to remove their underwear so PT can examine their perineum. The patient is also given the option of providing their own chaperone as one is not provided in our facility. The patient also has the right and is explained the right to defer or refuse any part of the evaluation or treatment including the internal exam. With the patient's consent, PT will use one gloved finger to gently assess the muscles of the pelvic floor, seeing how well it contracts and relaxes and if there is muscle symmetry. After, the patient will get dressed and PT and patient will discuss exam findings and plan of care. PT and patient discuss plan of care, schedule, attendance policy and HEP activities. Bil deep pelvic floor muscle release and bil urethrae sphincter release Dry needling: Neuromuscular re-education: Core retraining:  Core facilitation: Form correction: Pelvic floor contraction training: Down training: Exercises: Stretches/mobility: Open books 10x bil Hip  flexor stretch 60 sec bil Strengthening: Therapeutic activities: Functional strengthening activities: Self-care:    TREATMENT 09/13/21: Manual: Soft tissue mobilization: Scar tissue mobilization: Myofascial release: Spinal mobilization: Internal pelvic floor techniques: No emotional/communication barriers or cognitive limitation. Patient is motivated to learn. Patient understands and agrees with treatment goals and plan. PT explains patient will be examined in standing, sitting, and lying down to see how their muscles and joints work. When they are ready, they will be asked to remove their underwear so PT can examine their perineum. The patient is also given the option of providing their own chaperone as one is not provided in our facility. The patient also has the right and is explained the right to defer or refuse any part of the evaluation or treatment including the internal exam. With the patient's consent, PT will use one gloved finger to gently assess the muscles of the pelvic floor, seeing how well it contracts and relaxes and if there is muscle symmetry. After, the patient will get dressed and PT and patient will discuss exam findings and plan of care. PT and patient discuss plan of care, schedule, attendance policy and HEP activities. Bil deep pelvic floor muscle release and bil urethrae sphincter release Dry needling: Neuromuscular re-education: Core retraining:  Core facilitation: Form correction: Pelvic floor contraction training: Down training: Diaphragmatic breathing with internal feedback for improved pelvic floor muscle release - extensive discussion about how to allow abdominals to relax and expand as well to improve pelvic floor release.  Improving awareness of abdominal/pelvic floor clenching Exercises: Stretches/mobility: Strengthening: Therapeutic activities: Functional strengthening activities: Self-care: Pelvic floor anatomy Prolapse anatomy Intra-abdominal  pressure management What an internal pelvic exam entails in physical therapy why it's beneficial to perform Lubricant and moisturizer samples given      PATIENT EDUCATION:  Education details: See above self-care; exercise progressions Person educated: Patient Education method: Explanation, Demonstration, Tactile cues, Verbal cues, and Handouts Education comprehension: verbalized understanding     HOME EXERCISE PROGRAM: [id number]   ASSESSMENT:   CLINICAL IMPRESSION: Pt is seeing some progress with decreased pain with intercourse and improved ease of urination. However, she is still bearing down after she finishes urination; we discussed how this  is working against pelvic floor relaxation and complete emptying, and attempt pelvic floor bulge and double-voiding instead. She is making great improvements in diaphragmatic breathing with improved expansion; various positions and activities performed to help encourage this today. We spent time clarifying difference between kegel and reverse kegel with breath coordination. She will continue to benefit from skilled PT intervention in order to address impairments, improve complete bladder emptying, decrease deep dyspareunia, decrease low back pain, and perform functional strengthening with appropriate abdominal pressure management.        OBJECTIVE IMPAIRMENTS decreased activity tolerance, decreased cognition, decreased coordination, decreased endurance, decreased mobility, decreased strength, hypomobility, increased fascial restrictions, increased muscle spasms, impaired flexibility, impaired tone, postural dysfunction, and pain.    ACTIVITY LIMITATIONS  interpersonal relationships; emptying bladder .    PERSONAL FACTORS 1-2 comorbidities: cesarean delivery 2011 and liposuction 2022  are also affecting patient's functional outcome.      REHAB POTENTIAL: Good   CLINICAL DECISION MAKING: Evolving/moderate complexity   EVALUATION COMPLEXITY:  Moderate     GOALS: Goals reviewed with patient? Yes   SHORT TERM GOALS: Target date: 09/26/2021   Pt will be independent with HEP.    Baseline: Goal status: INITIAL   2.  Pt will be able to correctly perform diaphragmatic breathing and appropriate pressure management in order to prevent worsening vaginal wall laxity and improve pelvic floor A/ROM.    Baseline:  Goal status: INITIAL   3.  Pt will be able to teach back and utilize urge suppression technique in order to help reduce number of trips to the bathroom.     Baseline:  Goal status: INITIAL       LONG TERM GOALS: Target date: 11/21/2021   Pt will be independent with advanced HEP.    Baseline:  Goal status: INITIAL   2.  Pt will demonstrate normal pelvic floor muscle tone and A/ROM, able to achieve 4/5 strength with contractions and 10 sec endurance, in order to provide appropriate lumbopelvic support in functional activities.    Baseline:  Goal status: INITIAL   3.  Pt will report 0/10 pain with vaginal penetration in order to improve intimate relationship with partner.     Baseline:  Goal status: INITIAL   4.  Pt will report complete bladder emptying, indicating improved pelvic floor relaxation/coordination.  Baseline:  Goal status: INITIAL   5.  Pt will be able to go 2-3 hours in between voids without urgency or incontinence in order to improve QOL and perform all functional activities with less difficulty.    Baseline:  Goal status: INITIAL   6.  Pt will demonstrate appropriate intra-abdominal pressure management to avoid worsening vaginal wall laxity, diastasis, and pelvic floor tension with all functional activities/strengthening.  Baseline:  Goal status: INITIAL   PLAN: PT FREQUENCY: 1x/week   PT DURATION: 12 weeks   PLANNED INTERVENTIONS: Therapeutic exercises, Therapeutic activity, Neuromuscular re-education, Balance training, Gait training, Patient/Family education, Joint mobilization, Dry  Needling, Biofeedback, and Manual therapy   PLAN FOR NEXT SESSION: Continue manual techniques as needed; progress mobility and begin gentle core retraining.    Heather Roberts, PT, DPT06/05/2309:44 AM

## 2021-10-08 ENCOUNTER — Ambulatory Visit: Payer: PRIVATE HEALTH INSURANCE

## 2021-10-08 DIAGNOSIS — M62838 Other muscle spasm: Secondary | ICD-10-CM

## 2021-10-08 DIAGNOSIS — R279 Unspecified lack of coordination: Secondary | ICD-10-CM

## 2021-10-08 DIAGNOSIS — M6281 Muscle weakness (generalized): Secondary | ICD-10-CM | POA: Diagnosis not present

## 2021-10-08 NOTE — Therapy (Signed)
OUTPATIENT PHYSICAL THERAPY TREATMENT NOTE   Patient Name: Kristin Daniels MRN: [id number] DOB:[birth year], 46 y.o., female Today's Date: 10/08/2021  PCP: Lyndee Hensen, DO REFERRING PROVIDER: Aletha Halim, MD  END OF SESSION:   PT End of Session - 10/08/21 1112     Visit Number 6    Date for PT Re-Evaluation 11/21/21    Authorization Type [employer]    Authorization Time Period 09/04/21-11/26/21    Authorization - Visit Number 5    Authorization - Number of Visits 12    PT Start Time 1108    PT Stop Time 1151    PT Time Calculation (min) 43 min    Activity Tolerance Patient tolerated treatment well    Behavior During Therapy WFL for tasks assessed/performed               Past Medical History:  Diagnosis Date   Acid reflux    Colon polyp    H. pylori infection    History of seasonal allergies    Hypercholesterolemia    Migraines    Pituitary tumor    Thyroid nodule    Vaginal Pap smear, abnormal    Past Surgical History:  Procedure Laterality Date   CESAREAN SECTION     OTHER SURGICAL HISTORY     hemangio removed at child and had something added to head to help indentation   Patient Active Problem List   Diagnosis Date Noted   Vaginal candidiasis 05/21/2021   Oral candidiasis 05/21/2021   Bilateral leg pain 05/21/2021   Lumbar radiculopathy, acute 04/10/2021   Costochondral chest pain 12/01/2020   Pain of right lower leg 12/01/2020   Abdominal wall fluid collections 12/01/2020   Seasonal allergic rhinitis due to pollen 08/28/2020   Acne 07/26/2020   Physically well but worried 07/26/2020   H. 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 She is trying very hard to not push the remainder of urine out, but sometimes feels like she leaks when she is wiping. She is not consistently using double voiding technique. She is feeling more relaxed overall and feels like she is having less low back pain/hip pain.   PAIN:  Are you having pain? Yes- cramping in lower abdomen  SUBJECTIVE STATEMENT 08/29/21: Pt states that she has been having difficulty emptying bladder completely due to intermittent stream. She sits down to urinate, then stream stops and she has to bear down to finish. Rt lower quadrant pain not present any longer since cyst is gone. She was having low back pain and went to PT, which helped some.  Fluid intake: Yes: reports she drinks a lot of water - occasional alcohol and carbonated beverages     Patient confirms identification and approves PT to assess pelvic floor and treatment Yes     PAIN:  Are you having pain? No  PRECAUTIONS: None   WEIGHT BEARING RESTRICTIONS No   FALLS:  Has patient fallen in last 6 months? No   LIVING ENVIRONMENT: Lives with: lives with their family Lives in: House/apartment     OCCUPATION: not currently working   PLOF: Independent   PATIENT GOALS decrease pain and improve bladder emptying   PERTINENT HISTORY:   Sexual abuse: No   BOWEL MOVEMENT Pain with bowel movement: No Type of bowel movement:Frequency 1x/day and Strain Yes Fully empty rectum: Yes: - Leakage: No Pads: No Fiber supplement: Yes: -   URINATION Pain with urination: No Fully empty bladder: No Stream:   intermittent, has to strain to completely empty Urgency: No Frequency: every hour Leakage:  none Pads: No   INTERCOURSE Pain with intercourse: During Penetration Ability to have vaginal penetration:  Yes: she will get sharp shooting pains and dull achiness - no pain after Climax: no pain     PREGNANCY Vaginal deliveries 0 Tearing No C-section deliveries 1 - 2011 Currently pregnant No         OBJECTIVE 09/13/21: Minimal anterior vaginal wall laxity, pelvic floor strength 2-3/5 with poor coordination and relaxation, vaginal dryness, discomfort and restriction with palpation of bil levator ani and sphincter urethrae.    08/29/21:    DIAGNOSTIC FINDINGS:  MRI 04/2018 demonstrates Rt ovarian cyst 4cm Recent US with multiple uterine fibroids   COGNITION:            Overall cognitive status: Within functional limits for tasks assessed                          SENSATION:            Light touch: Appears intact            Proprioception: Appears intact   MUSCLE LENGTH: Hamstrings: Right dec deg; Left dec deg Marcello Moores test: Right WNL deg; Left WNL deg Piriformis dec bil     FUNCTIONAL TESTS:  Squat: lt weight shift Single leg stance: 4 sec bil GAIT: increased lumbar lordosis/stiffness   POSTURE:  Rt facing sacrum   LUMBARAROM/PROM Flexion 75%, extension 25% with pain bil buttocks, bil rotation 50% with lumbar tightness, bil side bend 75% with bil lumbar tightness   LE ROM:       LE MMT:                         Bil grossly 3/5   PELVIC MMT:  Not assessed today           PALPATION:   General  2 finger width diastasis with no distortion; significant cesarean scar tissue restriction; high tone in abdominals; tenderness to palpation throughout lumbar paraspinals, glutes, and lower abdomen                 External Perineal Exam NA                             Internal Pelvic Floor NA   TONE: NA   PROLAPSE: NA   TODAY'S TREATMENT 10/08/21 Neuromuscular  re-education: Core retraining:  Multimodal cues to perform transversus abdominus training with breath coordination Core facilitation: Supine march 3 x 10 with core facilitation/breath coordination Leg extensions 2 x 10 bil with core facilitation/breath coordination Cat/cow with core facilitation cues 2 x 10  Exercises: Stretches/mobility: Supine piriformis stretch 60 sec bil Strengthening: Bridge with ball squeeze  2 x 10 Clam shells 2 x 10 bil   TREATMENT 10/04/21 Neuromuscular re-education: Pelvic floor contraction training: Pelvic floor bulge/contraction training in seated with towel roll for feedback - used diaphragmatic breathing to help improve pelvic floor relaxation/expansion with inhale Down training: Diaphragmatic breathing in various positions to improve motor control and rib/abdominal excursion  Exercises: Stretches/mobility: Child's pose with lateral pull and manual to help improve mobility with breathing 2 x 10 breaths each direction Sidelying stretch with arm overhead and lying on bolster  - manual cues  Self-care: Double voiding Pelvic floor anatomy: reverse kegel vs kegel   TREATMENT 09/18/21: Manual: Soft tissue mobilization: Scar tissue mobilization: Abdominal scar tissue mobilization Myofascial release: External bladder release Spinal mobilization: Internal pelvic floor techniques: No emotional/communication barriers or cognitive limitation. Patient is motivated to learn. Patient understands and agrees with treatment goals and plan. PT explains patient will be examined in standing, sitting, and lying down to see how their muscles and joints work. When they are ready, they will be asked to remove their underwear so PT can examine their perineum. The patient is also given the option of providing their own chaperone as one is not provided in our facility. The patient also has the right and is explained the right to defer or refuse any part of the evaluation or  treatment including the internal exam. With the patient's consent, PT will use one gloved finger to gently assess the muscles of the pelvic floor, seeing how well it contracts and relaxes and if there is muscle symmetry. After, the patient will get dressed and PT and patient will discuss exam findings and plan of care. PT and patient discuss plan of care, schedule, attendance policy and HEP activities. Bil deep pelvic floor muscle release and bil urethrae sphincter release Dry needling: Neuromuscular re-education: Core retraining:  Core facilitation: Form correction: Pelvic floor contraction training: Down training: Exercises: Stretches/mobility: Open books 10x bil Hip flexor stretch 60 sec bil Strengthening: Therapeutic activities: Functional strengthening activities: Self-care:       PATIENT EDUCATION:  Education details: See above self-care; exercise progressions Person educated: Patient Education method: Explanation, Demonstration, Tactile cues, Verbal cues, and Handouts Education comprehension: verbalized understanding     HOME EXERCISE PROGRAM: [id number]   ASSESSMENT:   CLINICAL IMPRESSION: Patient is doing very well overall with decreasing intra-abdominal pressure with urination and other activities. To reduce post-void dribbling while wiping, we reviewed double voiding and consistent use. She did well with all core facilitation training and incorporation into more difficult exercises with excellent understanding of breath coordination. She was also able to progress hip strengthening with good core activation and pressure management. She will continue to benefit from skilled PT intervention in order to address impairments, improve complete bladder emptying, decrease deep dyspareunia, decrease low back pain, and perform functional strengthening with appropriate abdominal pressure management.        OBJECTIVE IMPAIRMENTS decreased activity tolerance, decreased cognition,  decreased coordination, decreased endurance, decreased mobility, decreased strength, hypomobility, increased fascial restrictions, increased muscle spasms, impaired flexibility, impaired tone, postural dysfunction, and pain.    ACTIVITY LIMITATIONS  interpersonal relationships; emptying bladder .    PERSONAL FACTORS 1-2 comorbidities: cesarean delivery 2011 and liposuction 2022  are also affecting patient's functional outcome.      REHAB POTENTIAL: Good   CLINICAL DECISION MAKING: Evolving/moderate complexity   EVALUATION COMPLEXITY: Moderate     GOALS: Goals reviewed with patient? Yes   SHORT TERM GOALS: Target date: 09/26/2021   Pt will be  independent with HEP.    Baseline: Goal status: MET 10/08/21   2.  Pt will be able to correctly perform diaphragmatic breathing and appropriate pressure management in order to prevent worsening vaginal wall laxity and improve pelvic floor A/ROM.    Baseline:  Goal status: MET 10/08/21   3.  Pt will be able to teach back and utilize urge suppression technique in order to help reduce number of trips to the bathroom.     Baseline:  Goal status: IN PROGRESS 10/08/21       LONG TERM GOALS: Target date: 11/21/2021   Pt will be independent with advanced HEP.    Baseline:  Goal status: IN PROGRESS 10/08/21   2.  Pt will demonstrate normal pelvic floor muscle tone and A/ROM, able to achieve 4/5 strength with contractions and 10 sec endurance, in order to provide appropriate lumbopelvic support in functional activities.    Baseline:  Goal status: IN PROGRESS 10/08/21   3.  Pt will report 0/10 pain with vaginal penetration in order to improve intimate relationship with partner.     Baseline:  Goal status: IN PROGRESS 10/08/21   4.  Pt will report complete bladder emptying, indicating improved pelvic floor relaxation/coordination.  Baseline:  Goal status: IN PROGRESS 10/08/21   5.  Pt will be able to go 2-3 hours in between voids without urgency or  incontinence in order to improve QOL and perform all functional activities with less difficulty.    Baseline: She is getting closer to every 2 hours consistently  Goal status: IN PROGRESS 10/08/21   6.  Pt will demonstrate appropriate intra-abdominal pressure management to avoid worsening vaginal wall laxity, diastasis, and pelvic floor tension with all functional activities/strengthening.  Baseline:  Goal status: IN PROGRESS 10/08/21   PLAN: PT FREQUENCY: 1x/week   PT DURATION: 12 weeks   PLANNED INTERVENTIONS: Therapeutic exercises, Therapeutic activity, Neuromuscular re-education, Balance training, Gait training, Patient/Family education, Joint mobilization, Dry Needling, Biofeedback, and Manual therapy   PLAN FOR NEXT SESSION: Return to pelvic floor to assess for progress and release any restriction/tension. Progress core strengthening/mobility. Teach urge suppression technique with internal feedback.    Heather Roberts, PT, DPT06/09/2309:56 AM

## 2021-10-09 ENCOUNTER — Ambulatory Visit
Admission: RE | Admit: 2021-10-09 | Discharge: 2021-10-09 | Disposition: A | Discharge status: Home / Self Care | Payer: Medicaid Other | Source: Ambulatory Visit | Attending: Neurology | Admitting: Neurology

## 2021-10-09 ENCOUNTER — Encounter: Payer: Self-pay | Admitting: *Deleted

## 2021-10-09 DIAGNOSIS — D352 Benign neoplasm of pituitary gland: Secondary | ICD-10-CM

## 2021-10-09 MED ORDER — GADOBENATE DIMEGLUMINE 529 MG/ML IV SOLN
10.0000 mL | Freq: Once | INTRAVENOUS | Status: AC | PRN
  Administered 2021-10-09: 10 mL via INTRAVENOUS

## 2021-10-15 ENCOUNTER — Ambulatory Visit: Payer: PRIVATE HEALTH INSURANCE

## 2021-10-15 DIAGNOSIS — R279 Unspecified lack of coordination: Secondary | ICD-10-CM

## 2021-10-15 DIAGNOSIS — M6281 Muscle weakness (generalized): Secondary | ICD-10-CM | POA: Diagnosis not present

## 2021-10-15 DIAGNOSIS — M62838 Other muscle spasm: Secondary | ICD-10-CM

## 2021-10-15 NOTE — Therapy (Signed)
OUTPATIENT PHYSICAL THERAPY TREATMENT NOTE   Patient Name: Tyanne Derocher MRN: [id number] DOB:[birth year], 46 y.o., female Today's Date: 10/15/2021  PCP: Lyndee Hensen, DO REFERRING PROVIDER: Aletha Halim, MD  END OF SESSION:   PT End of Session - 10/15/21 1104     Visit Number 7    Date for PT Re-Evaluation 11/21/21    Authorization Type [employer]    Authorization Time Period 09/04/21-11/26/21    Authorization - Visit Number 6    Authorization - Number of Visits 12    PT Start Time 1100    PT Stop Time 1140    PT Time Calculation (min) 40 min    Activity Tolerance Patient tolerated treatment well    Behavior During Therapy WFL for tasks assessed/performed                Past Medical History:  Diagnosis Date   Acid reflux    Colon polyp    H. pylori infection    History of seasonal allergies    Hypercholesterolemia    Migraines    Pituitary tumor    Thyroid nodule    Vaginal Pap smear, abnormal    Past Surgical History:  Procedure Laterality Date   CESAREAN SECTION     OTHER SURGICAL HISTORY     hemangio removed at child and had something added to head to help indentation   Patient Active Problem List   Diagnosis Date Noted   Vaginal candidiasis 05/21/2021   Oral candidiasis 05/21/2021   Bilateral leg pain 05/21/2021   Lumbar radiculopathy, acute 04/10/2021   Costochondral chest pain 12/01/2020   Pain of right lower leg 12/01/2020   Abdominal wall fluid collections 12/01/2020   Seasonal allergic rhinitis due to pollen 08/28/2020   Acne 07/26/2020   Physically well but worried 07/26/2020   H. 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 She feels like she is still seeing improvements with bladder emptying and is working very hard on not straining.    PAIN:  Are you having pain? Yes- cramping in lower abdomen  SUBJECTIVE STATEMENT 08/29/21: Pt states that she has been having difficulty emptying bladder completely due to intermittent stream. She sits down to urinate, then stream stops and she has to bear down to finish. Rt lower quadrant pain not present any longer since cyst is gone. She was having low back pain and went to PT, which helped some.  Fluid intake: Yes: reports she drinks a lot of water - occasional alcohol and carbonated beverages     Patient confirms identification and approves PT to assess pelvic floor and treatment Yes     PAIN:  Are you having pain? No       PRECAUTIONS: None   WEIGHT BEARING RESTRICTIONS  No   FALLS:  Has patient fallen in last 6 months? No   LIVING ENVIRONMENT: Lives with: lives with their family Lives in: House/apartment     OCCUPATION: not currently working   PLOF: Independent   PATIENT GOALS decrease pain and improve bladder emptying   PERTINENT HISTORY:   Sexual abuse: No   BOWEL MOVEMENT Pain with bowel movement: No Type of bowel movement:Frequency 1x/day and Strain Yes Fully empty rectum: Yes: - Leakage: No Pads: No Fiber supplement: Yes: -   URINATION Pain with urination: No Fully empty bladder: No Stream:  intermittent, has to strain to completely empty Urgency: No Frequency: every  hour Leakage:  none Pads: No   INTERCOURSE Pain with intercourse: During Penetration Ability to have vaginal penetration:  Yes: she will get sharp shooting pains and dull achiness - no pain after Climax: no pain     PREGNANCY Vaginal deliveries 0 Tearing No C-section deliveries 1 - 2011 Currently pregnant No         OBJECTIVE 09/13/21: Minimal anterior vaginal wall laxity, pelvic floor strength 2-3/5 with poor coordination and relaxation, vaginal dryness, discomfort and restriction with palpation of bil levator ani and sphincter urethrae.    08/29/21:    DIAGNOSTIC FINDINGS:  MRI 04/2018 demonstrates Rt ovarian cyst 4cm Recent US with multiple uterine fibroids   COGNITION:            Overall cognitive status: Within functional limits for tasks assessed                          SENSATION:            Light touch: Appears intact            Proprioception: Appears intact   MUSCLE LENGTH: Hamstrings: Right dec deg; Left dec deg Marcello Moores test: Right WNL deg; Left WNL deg Piriformis dec bil     FUNCTIONAL TESTS:  Squat: lt weight shift Single leg stance: 4 sec bil GAIT: increased lumbar lordosis/stiffness   POSTURE:  Rt facing sacrum   LUMBARAROM/PROM Flexion 75%, extension 25% with pain bil buttocks, bil rotation 50% with lumbar tightness, bil side bend 75% with bil lumbar tightness   LE ROM:       LE MMT:                         Bil grossly 3/5   PELVIC MMT:  Not assessed today           PALPATION:   General  2 finger width diastasis with no distortion; significant cesarean scar tissue restriction; high tone in abdominals; tenderness to palpation throughout lumbar paraspinals, glutes, and lower abdomen                 External Perineal Exam NA                             Internal Pelvic Floor NA   TONE: NA   PROLAPSE: NA   TODAY'S TREATMENT 10/15/21: Manual: Scar tissue mobilization: Lower abdominal scar tissue mobilization Myofascial  release: Bladder mobilization Internal pelvic floor techniques: No emotional/communication barriers or cognitive limitation. Patient is motivated to learn. Patient understands and agrees with treatment goals and plan. PT explains patient will be examined in standing, sitting, and lying down to see how their muscles and joints work. When they are ready, they will be asked  to remove their underwear so PT can examine their perineum. The patient is also given the option of providing their own chaperone as one is not provided in our facility. The patient also has the right and is explained the right to defer or refuse any part of the evaluation or treatment including the internal exam. With the patient's consent, PT will use one gloved finger to gently assess the muscles of the pelvic floor, seeing how well it contracts and relaxes and if there is muscle symmetry. After, the patient will get dressed and PT and patient will discuss exam findings and plan of care. PT and patient discuss plan of care, schedule, attendance policy and HEP activities. Urethral mobilization bil Bil deep pelvic floor release, Rt>Lt Internal bladder traction Neuromuscular re-education: Core retraining:  Core facilitation: Form correction: Pelvic floor contraction training: Down training: Mindfulness training Diaphragmatic breathing Reverse kegels/pelvic floor bulge   TREATMENT 10/08/21 Neuromuscular re-education: Core retraining:  Multimodal cues to perform transversus abdominus training with breath coordination Core facilitation: Supine march 3 x 10 with core facilitation/breath coordination Leg extensions 2 x 10 bil with core facilitation/breath coordination Cat/cow with core facilitation cues 2 x 10  Exercises: Stretches/mobility: Supine piriformis stretch 60 sec bil Strengthening: Bridge with ball squeeze 2 x 10 Clam shells 2 x 10 bil   TREATMENT 10/04/21 Neuromuscular re-education: Pelvic floor contraction  training: Pelvic floor bulge/contraction training in seated with towel roll for feedback - used diaphragmatic breathing to help improve pelvic floor relaxation/expansion with inhale Down training: Diaphragmatic breathing in various positions to improve motor control and rib/abdominal excursion  Exercises: Stretches/mobility: Child's pose with lateral pull and manual to help improve mobility with breathing 2 x 10 breaths each direction Sidelying stretch with arm overhead and lying on bolster  - manual cues  Self-care: Double voiding Pelvic floor anatomy: reverse kegel vs kegel      PATIENT EDUCATION:  Education details: Discussed increase in tension and how stress can contribute - mindfulness education Person educated: Patient Education method: Explanation, Demonstration, Tactile cues, Verbal cues, and Handouts Education comprehension: verbalized understanding     HOME EXERCISE PROGRAM: [id number]   ASSESSMENT:   CLINICAL IMPRESSION: Patient demonstrated increase in pelvic floor tension today. We discussed how stress can contribute to this and it doesn't mean she is doing anything wrong. However, we did discuss how stretches and relaxation breathing can be very helpful during periods of stress to combat any increase in tension. She tolerated manual techniques to bladder, urethra, and deep pelvic floor well with no increase in pain after treatment and good improvements in restriction throughout. She was educated on mindfulness and written handout given to begin working on. Due to significant restriction of scar tissue directly over bladder, she was encouraged to perform self-scar tissue mobilization here. She will continue to benefit from skilled PT intervention in order to address impairments, improve complete bladder emptying, decrease deep dyspareunia, decrease low back pain, and perform functional strengthening with appropriate abdominal pressure management.        OBJECTIVE  IMPAIRMENTS decreased activity tolerance, decreased cognition, decreased coordination, decreased endurance, decreased mobility, decreased strength, hypomobility, increased fascial restrictions, increased muscle spasms, impaired flexibility, impaired tone, postural dysfunction, and pain.    ACTIVITY LIMITATIONS  interpersonal relationships; emptying bladder .    PERSONAL FACTORS 1-2 comorbidities: cesarean delivery 2011 and liposuction 2022  are also affecting patient's functional outcome.      REHAB POTENTIAL: Good   CLINICAL DECISION MAKING: Evolving/moderate complexity   EVALUATION COMPLEXITY:  Moderate     GOALS: Goals reviewed with patient? Yes   SHORT TERM GOALS: Target date: 09/26/2021   Pt will be independent with HEP.    Baseline: Goal status: MET 10/08/21   2.  Pt will be able to correctly perform diaphragmatic breathing and appropriate pressure management in order to prevent worsening vaginal wall laxity and improve pelvic floor A/ROM.    Baseline:  Goal status: MET 10/08/21   3.  Pt will be able to teach back and utilize urge suppression technique in order to help reduce number of trips to the bathroom.     Baseline:  Goal status: IN PROGRESS 10/08/21       LONG TERM GOALS: Target date: 11/21/2021   Pt will be independent with advanced HEP.    Baseline:  Goal status: IN PROGRESS 10/08/21   2.  Pt will demonstrate normal pelvic floor muscle tone and A/ROM, able to achieve 4/5 strength with contractions and 10 sec endurance, in order to provide appropriate lumbopelvic support in functional activities.    Baseline:  Goal status: IN PROGRESS 10/08/21   3.  Pt will report 0/10 pain with vaginal penetration in order to improve intimate relationship with partner.     Baseline:  Goal status: IN PROGRESS 10/08/21   4.  Pt will report complete bladder emptying, indicating improved pelvic floor relaxation/coordination.  Baseline:  Goal status: IN PROGRESS 10/08/21   5.  Pt  will be able to go 2-3 hours in between voids without urgency or incontinence in order to improve QOL and perform all functional activities with less difficulty.    Baseline: She is getting closer to every 2 hours consistently  Goal status: IN PROGRESS 10/08/21   6.  Pt will demonstrate appropriate intra-abdominal pressure management to avoid worsening vaginal wall laxity, diastasis, and pelvic floor tension with all functional activities/strengthening.  Baseline:  Goal status: IN PROGRESS 10/08/21   PLAN: PT FREQUENCY: 1x/week   PT DURATION: 12 weeks   PLANNED INTERVENTIONS: Therapeutic exercises, Therapeutic activity, Neuromuscular re-education, Balance training, Gait training, Patient/Family education, Joint mobilization, Dry Needling, Biofeedback, and Manual therapy   PLAN FOR NEXT SESSION: Return to pelvic floor to assess for progress and release any restriction/tension. Progress core strengthening/mobility. Teach urge suppression technique with internal feedback.    Heather Roberts, PT, DPT06/04/2311:00 PM

## 2021-10-15 NOTE — Patient Instructions (Signed)
Mindfulness: A common source of pelvic floor muscle tension is stress and anxiety. Mindfulness training has been shown to be beneficial in reducing stress and anxiety by increasing bodily awareness and learning to relax unwanted tension. A good starting mindfulness practice is by Mitzi Davenport; follow instructions below to access free recorded sessions: Type Mitzi Davenport into browser, go to website and click guided meditation, listen now, pick "progressive relaxation" or "relaxation body scan" If you do not like these sessions, try typing the above titles into YouTube for hundreds of options! A great YouTube option is [employer]: 10 minute body scan Mindfulness is a very Tree surgeon; therefore, it can take time and trial and error in order to find what is most beneficial for you. There are also apps you can pay to use: [facility]  [facility] [address] [place], [place] [address] Phone # [phone] Fax [phone]

## 2021-10-17 ENCOUNTER — Encounter: Payer: Self-pay | Admitting: Neurology

## 2021-10-17 NOTE — Telephone Encounter (Signed)
Called patient with recommendations. Patient not understanding why the MRI didn't show the tumor before. Patient had many more questions that I asked her to save until her appointment on 6-26 that dr. Tomi Likens can go over all of those questions at the time of the appointment. Dr. Tomi Likens did want me to personally call the patient to answer as many questions as I could before the appointment but patient asking very specific questions I am unable to provide all the answers to and asked her to please speak to Dr. Tomi Likens at the upcoming appointment. She did state she is having new symptoms that I let her know with new symptoms she needs to be examined at her appointment

## 2021-10-22 ENCOUNTER — Ambulatory Visit: Payer: PRIVATE HEALTH INSURANCE

## 2021-10-22 DIAGNOSIS — M6281 Muscle weakness (generalized): Secondary | ICD-10-CM | POA: Diagnosis not present

## 2021-10-22 DIAGNOSIS — R279 Unspecified lack of coordination: Secondary | ICD-10-CM

## 2021-10-22 DIAGNOSIS — M62838 Other muscle spasm: Secondary | ICD-10-CM

## 2021-10-22 NOTE — Therapy (Signed)
OUTPATIENT PHYSICAL THERAPY TREATMENT NOTE   Patient Name: Kristin Daniels MRN: [id number] DOB:1975-07-19, 46 y.o., female Today's Date: 10/22/2021  PCP: Katha Cabal, DO REFERRING PROVIDER: Montezuma Bing, MD  END OF SESSION:   PT End of Session - 10/22/21 1029     Visit Number 8    Date for PT Re-Evaluation 11/21/21    Authorization Type CC[employer]    Authorization Time Period 09/04/21-11/26/21    Authorization - Visit Number 7    Authorization - Number of Visits 12    PT Start Time 1028    PT Stop Time 1058    PT Time Calculation (min) 30 min    Activity Tolerance Patient tolerated treatment well    Behavior During Therapy WFL for tasks assessed/performed                Past Medical History:  Diagnosis Date   Acid reflux    Colon polyp    H. pylori infection    History of seasonal allergies    Hypercholesterolemia    Migraines    Pituitary tumor    Thyroid nodule    Vaginal Pap smear, abnormal    Past Surgical History:  Procedure Laterality Date   CESAREAN SECTION     OTHER SURGICAL HISTORY     hemangio removed at child and had something added to head to help indentation   Patient Active Problem List   Diagnosis Date Noted   Vaginal candidiasis 05/21/2021   Oral candidiasis 05/21/2021   Bilateral leg pain 05/21/2021   Lumbar radiculopathy, acute 04/10/2021   Costochondral chest pain 12/01/2020   Pain of right lower leg 12/01/2020   Abdominal wall fluid collections 12/01/2020   Seasonal allergic rhinitis due to pollen 08/28/2020   Acne 07/26/2020   Physically well but worried 07/26/2020   H. 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   History of pituitary tumor 04/21/2017   History of thyroid nodule 04/21/2017   Enlarged thyroid gland 03/14/2017   Fibrocystic breast changes, bilateral 03/14/2017   Benign tumor of pituitary gland (HCC) 03/14/2017    Uterine leiomyoma 03/14/2017    REFERRING DIAG: N94.10 (ICD-10-CM) - Dyspareunia, female R33.9 (ICD-10-CM) - Incomplete bladder emptying  THERAPY DIAG:  Muscle weakness (generalized)  Other muscle spasm  Unspecified lack of coordination  PERTINENT HISTORY: Cesarean 2011, liposuction 2022  PRECAUTIONS: NA  SUBJECTIVE: Patient states that she is very stressed due to recent imaging findings and waiting further results. She feels like her whole body has been in pain due to this.   PAIN:  Are you having pain? Yes- 8/10, stress is exacerbating pain in whole body - worse in arms and legs, was helped by using body roller  SUBJECTIVE STATEMENT 08/29/21: Pt states that she has been having difficulty emptying bladder completely due to intermittent stream. She sits down to urinate, then stream stops and she has to bear down to finish. Rt lower quadrant pain not present any longer since cyst is gone. She was having low back pain and went to PT, which helped some.  Fluid intake: Yes: reports she drinks a lot of water - occasional alcohol and carbonated beverages     Patient confirms identification and approves PT to assess pelvic floor and treatment Yes     PAIN:  Are you having pain? No       PRECAUTIONS: None   WEIGHT BEARING RESTRICTIONS No  FALLS:  Has patient fallen in last 6 months? No   LIVING ENVIRONMENT: Lives with: lives with their family Lives in: House/apartment     OCCUPATION: not currently working   PLOF: Independent   PATIENT GOALS decrease pain and improve bladder emptying   PERTINENT HISTORY:   Sexual abuse: No   BOWEL MOVEMENT Pain with bowel movement: No Type of bowel movement:Frequency 1x/day and Strain Yes Fully empty rectum: Yes: - Leakage: No Pads: No Fiber supplement: Yes: -   URINATION Pain with urination: No Fully empty bladder: No Stream:  intermittent, has to strain to completely empty Urgency: No Frequency: every hour Leakage:   none Pads: No   INTERCOURSE Pain with intercourse: During Penetration Ability to have vaginal penetration:  Yes: she will get sharp shooting pains and dull achiness - no pain after Climax: no pain     PREGNANCY Vaginal deliveries 0 Tearing No C-section deliveries 1 - 2011 Currently pregnant No         OBJECTIVE 09/13/21: Minimal anterior vaginal wall laxity, pelvic floor strength 2-3/5 with poor coordination and relaxation, vaginal dryness, discomfort and restriction with palpation of bil levator ani and sphincter urethrae.    08/29/21:    DIAGNOSTIC FINDINGS:  MRI 04/2018 demonstrates Rt ovarian cyst 4cm Recent US with multiple uterine fibroids   COGNITION:            Overall cognitive status: Within functional limits for tasks assessed                          SENSATION:            Light touch: Appears intact            Proprioception: Appears intact   MUSCLE LENGTH: Hamstrings: Right dec deg; Left dec deg Marcello Moores test: Right WNL deg; Left WNL deg Piriformis dec bil     FUNCTIONAL TESTS:  Squat: lt weight shift Single leg stance: 4 sec bil GAIT: increased lumbar lordosis/stiffness   POSTURE:  Rt facing sacrum   LUMBARAROM/PROM Flexion 75%, extension 25% with pain bil buttocks, bil rotation 50% with lumbar tightness, bil side bend 75% with bil lumbar tightness   LE ROM:       LE MMT:                         Bil grossly 3/5   PELVIC MMT:  Not assessed today           PALPATION:   General  2 finger width diastasis with no distortion; significant cesarean scar tissue restriction; high tone in abdominals; tenderness to palpation throughout lumbar paraspinals, glutes, and lower abdomen                 External Perineal Exam NA                             Internal Pelvic Floor NA   TONE: NA   PROLAPSE: NA   TODAY'S TREATMENT 10/22/21 Exercises: Stretches/mobility: Swiss ball up wall 10x Swiss ball seated routine: circles, lateral glides, hip  shifts, marching, pelvic tilt, lateral flexion Kneeling hip flexor stretch 60 sec bil Wide stance thoracic openers using block 10x bil Strengthening: Self-care: Role of stress on the body, stress management Reviewed Mindfulness and importance especially at this time   TREATMENT 10/15/21: Manual: Scar tissue mobilization: Lower abdominal scar tissue mobilization Myofascial  release: Bladder mobilization Internal pelvic floor techniques: No emotional/communication barriers or cognitive limitation. Patient is motivated to learn. Patient understands and agrees with treatment goals and plan. PT explains patient will be examined in standing, sitting, and lying down to see how their muscles and joints work. When they are ready, they will be asked to remove their underwear so PT can examine their perineum. The patient is also given the option of providing their own chaperone as one is not provided in our facility. The patient also has the right and is explained the right to defer or refuse any part of the evaluation or treatment including the internal exam. With the patient's consent, PT will use one gloved finger to gently assess the muscles of the pelvic floor, seeing how well it contracts and relaxes and if there is muscle symmetry. After, the patient will get dressed and PT and patient will discuss exam findings and plan of care. PT and patient discuss plan of care, schedule, attendance policy and HEP activities. Urethral mobilization bil Bil deep pelvic floor release, Rt>Lt Internal bladder traction Neuromuscular re-education: Core retraining:  Core facilitation: Form correction: Pelvic floor contraction training: Down training: Mindfulness training Diaphragmatic breathing Reverse kegels/pelvic floor bulge   TREATMENT 10/08/21 Neuromuscular re-education: Core retraining:  Multimodal cues to perform transversus abdominus training with breath coordination Core facilitation: Supine march 3  x 10 with core facilitation/breath coordination Leg extensions 2 x 10 bil with core facilitation/breath coordination Cat/cow with core facilitation cues 2 x 10  Exercises: Stretches/mobility: Supine piriformis stretch 60 sec bil Strengthening: Bridge with ball squeeze 2 x 10 Clam shells 2 x 10 bil      PATIENT EDUCATION:  Education details: See above self-care Person educated: Patient Education method: Explanation, Demonstration, Tactile cues, Verbal cues, and Handouts Education comprehension: verbalized understanding     HOME EXERCISE PROGRAM: [id number]   ASSESSMENT:   CLINICAL IMPRESSION: Patient feeling impact of stress from recent imaging results over the last week demonstrated by increase in whole body aches/pains. She was able to focus on mobility exercises this treatment session to encourage anterior abdominal mobility and improve stress holding patterns that may be impacting bladder/urethral restriction. She tolerated all stretches/exercises well and reported feeling less tight in body at end of session. She will continue to benefit from skilled PT intervention in order to address impairments, improve complete bladder emptying, decrease deep dyspareunia, decrease low back pain, and perform functional strengthening with appropriate abdominal pressure management.        OBJECTIVE IMPAIRMENTS decreased activity tolerance, decreased cognition, decreased coordination, decreased endurance, decreased mobility, decreased strength, hypomobility, increased fascial restrictions, increased muscle spasms, impaired flexibility, impaired tone, postural dysfunction, and pain.    ACTIVITY LIMITATIONS  interpersonal relationships; emptying bladder .    PERSONAL FACTORS 1-2 comorbidities: cesarean delivery 2011 and liposuction 2022  are also affecting patient's functional outcome.      REHAB POTENTIAL: Good   CLINICAL DECISION MAKING: Evolving/moderate complexity   EVALUATION COMPLEXITY:  Moderate     GOALS: Goals reviewed with patient? Yes   SHORT TERM GOALS: Target date: 09/26/2021   Pt will be independent with HEP.    Baseline: Goal status: MET 10/08/21   2.  Pt will be able to correctly perform diaphragmatic breathing and appropriate pressure management in order to prevent worsening vaginal wall laxity and improve pelvic floor A/ROM.    Baseline:  Goal status: MET 10/08/21   3.  Pt will be able to teach back and utilize urge suppression  technique in order to help reduce number of trips to the bathroom.     Baseline:  Goal status: IN PROGRESS 10/08/21       LONG TERM GOALS: Target date: 11/21/2021   Pt will be independent with advanced HEP.    Baseline:  Goal status: IN PROGRESS 10/08/21   2.  Pt will demonstrate normal pelvic floor muscle tone and A/ROM, able to achieve 4/5 strength with contractions and 10 sec endurance, in order to provide appropriate lumbopelvic support in functional activities.    Baseline:  Goal status: IN PROGRESS 10/08/21   3.  Pt will report 0/10 pain with vaginal penetration in order to improve intimate relationship with partner.     Baseline:  Goal status: IN PROGRESS 10/08/21   4.  Pt will report complete bladder emptying, indicating improved pelvic floor relaxation/coordination.  Baseline:  Goal status: IN PROGRESS 10/08/21   5.  Pt will be able to go 2-3 hours in between voids without urgency or incontinence in order to improve QOL and perform all functional activities with less difficulty.    Baseline: She is getting closer to every 2 hours consistently  Goal status: IN PROGRESS 10/08/21   6.  Pt will demonstrate appropriate intra-abdominal pressure management to avoid worsening vaginal wall laxity, diastasis, and pelvic floor tension with all functional activities/strengthening.  Baseline:  Goal status: IN PROGRESS 10/08/21   PLAN: PT FREQUENCY: 1x/week   PT DURATION: 12 weeks   PLANNED INTERVENTIONS: Therapeutic exercises,  Therapeutic activity, Neuromuscular re-education, Balance training, Gait training, Patient/Family education, Joint mobilization, Dry Needling, Biofeedback, and Manual therapy   PLAN FOR NEXT SESSION: Return to pelvic floor to assess for progress and release any restriction/tension. Progress core strengthening/mobility. Teach urge suppression technique with internal feedback.    Heather Roberts, PT, DPT06/19/2311:02 AM

## 2021-10-26 NOTE — Progress Notes (Signed)
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  However, given the tinnitus in that ear, as well as reported decreased hearing, will refer to ENT.   Migraine prevention:  Start topiramate 25mg  at bedtime.  Continue nortriptyline to 100mg  at bedtime.  Migraine rescue:  sumatriptan 20mg  NS was denied, possibly because she needs to try a tablet?  Will have her try rizatriptan 10mg  Keep headache diary Refer to ENT regarding tinnitus, dizziness and left acoustic schwannoma Follow up 4 months.   Subjective:  Kristin Daniels is a 46 year old female with pituitary tumor (on cabergoline) and migraines who follows up for migraines.   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  Also noted is a small acoustic schwannoma in the left IAC, which is seen on prior imaging in retrospect and stable.  She reports worsening headaches.  She reports pain not only on the left side of head but also face and ear.  Also feels dizzy, off balance and with double vision with headache.   Intensity:  moderate Duration:  2 days Frequency:  they are now every other day.   She also has had episodes of vertigo since last year.  Occurs separate from headaches.  It seems positional, such as standing up.  Spinning sensation for a few seconds.  It occurs twice a week but may have 2 weeks without episodes.  She also has ringing in the left ear.  Cannot tell if she has hearing loss.     Current NSAIDS:  naproxen 550mg  Current analgesics:  none Current triptans:  none Current ergotamine:   none Current anti-emetic:  none Current muscle relaxants:  none Current anti-anxiolytic:  none Current sleep aide:  none Current Antihypertensive medications:  none Current Antidepressant medications:  nortriptyline 100mg  at bedtime Current Anticonvulsant medications:  none Current anti-CGRP:  none Current Vitamins/Herbal/Supplements:  Riboflavin-magnesium-feverfew 100-90-25mg  Current Antihistamines/Decongestants:  Flonase Other therapy:  none Hormone/birth control:  None Other medications:  cabergoline   Caffeine:  No coffee.   Diet:  Ginger ale.  Drinks a lot of water. Exercise:  no Depression:  no; Anxiety:  no Other pain:  no.  Seen in ED on 04/23/2021 for low back pain radiating down right leg with numbness and weakness where MRI of lumbar spine revealed right L5-S1 paracentral disc extrusion impinging on the right L5 nerve root.  She was referred to neurosurgery Sleep hygiene:  Inconsistent.  6-7 hours but often wakes up throughout the night.   HISTORY:  She has had headaches since her early 28s, at which time she was diagnosed with a pituitary adenoma.  Headaches are described as moderate to severe non-throbbing pressure involving top and front of head or left temporal region.  There is associated nausea, photophobia, phonophobia and scalp tenderness.  No associated vomiting, visual disturbance or numbness or weakness.  No specific triggers.  Rest helps relieve it.  They typically have occurred about 3 times a month.  However, they have been near daily since January 2020, lasting several hours up to all day.  She does not know why.  She has been treating headaches with  analgesics (Advil and Tylenol) about 4 days a week.     She takes cabergoline for her pituitary adenoma and is followed by endocrinology.  Thyroid and prolactin levels have been stable.  She had an eye exam which was unremarkable.    03/18/2016 MRI PITUITARY W WO:  posterior central left-sided pituitary adenoma,  likely proteinaceous, 7 mm x 4 mm x 4.5 mm extending to the insertion of the infundibulum which is slightly deviated toward the right, and mild remodeling of the sella floor.  05/31/2019 MRI BRAIN & PITUITARY W WO: two nonspecific punctate hyperintense foci in the right cerebral white matter (unremarkable) as well as 8 mm pituitary lesion which may be a microadenoma vs Rathke's cleft cyst.   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  She underwent reconstructive surgery in 2010.  PAST MEDICAL HISTORY: Past Medical History:  Diagnosis Date   Acid reflux    Colon polyp    H. pylori infection    History of seasonal allergies    Hypercholesterolemia    Migraines    Pituitary tumor    Thyroid nodule    Vaginal Pap smear, abnormal     MEDICATIONS: Current Outpatient Medications on File Prior to Visit  Medication Sig Dispense Refill   ALPRAZolam (XANAX) 1 MG tablet Take 1 mg by mouth daily.     ARIPiprazole (ABILIFY) 30 MG tablet Take 30 mg by mouth daily.     cabergoline (DOSTINEX) 0.5 MG tablet TAKE 1 TAB ON SUNDAYS AND HALF TABLET ON THURSDAYS 18 tablet 1   cetirizine (ZYRTEC) 10 MG tablet Take 1 tablet (10 mg total) by mouth daily. 30 tablet 11   clindamycin (CLEOCIN-T) 1 % external solution Apply topically 2 (two) times daily as needed. 30 mL 2   cyclobenzaprine (FLEXERIL) 5 MG tablet Take 1 tablet (5 mg total) by mouth 3 (three) times daily as needed for muscle spasms. 30  tablet 0   desonide (DESOWEN) 0.05 % cream Apply topically 2 (two) times daily. 30 g 0   FIBER PO Take by mouth.     fluticasone (FLONASE) 50 MCG/ACT nasal spray Place 2 sprays into both nostrils daily. 16 g 6   gabapentin (NEURONTIN) 100 MG capsule Take 2 capsules (200 mg total) by mouth 3 (three) times daily. 180 capsule 0   hydrocortisone 2.5 % cream Apply topically 2 (two) times daily. 90 g 1   Ibrexafungerp Citrate 150 MG TABS Take 2 tablets by mouth 2 (two) times daily. 4 tablet 0   MAGNESIUM PO Take by mouth.     Miconazole Nitrate Applicator 100 & 2 MG-% (9GM) KIT Place 100 mg vaginally at bedtime. 1 kit 2   Multiple Vitamin (MULTIVITAMIN) tablet Take 1 tablet by mouth daily.     naproxen sodium (ANAPROX DS) 550 MG tablet Take 1 tablet (550 mg total) by mouth 2 (two) times daily as needed. 20 tablet 5   nortriptyline (PAMELOR) 50 MG capsule TAKE 2 CAPSULES (100 MG TOTAL) BY MOUTH AT BEDTIME. 180 capsule 2  nystatin (MYCOSTATIN) 100000 UNIT/ML suspension Take 5 mLs (500,000 Units total) by mouth 4 (four) times daily. 60 mL 0   Olopatadine HCl 0.2 % SOLN Apply 1 drop to eye daily. 1 Bottle 0   Omega-3 Fatty Acids (FISH OIL PO) Take by mouth.     omeprazole (PRILOSEC) 40 MG capsule Take 1 capsule (40 mg total) by mouth daily. 30 capsule 0   Plecanatide (TRULANCE) 3 MG TABS Take 1 tablet by mouth daily. 30 tablet 11   Riboflavin-Magnesium-Feverfew 100-90-25 MG TABS Take 100 mg by mouth daily. 30 tablet 0   SUMAtriptan (IMITREX) 20 MG/ACT nasal spray 1 spray in one nostril as needed.  May repeat in 2 hours if headache persists or recurs.  Maximum 2 sprays in 24 hours. 6 each 5   terconazole (TERAZOL 7) 0.4 % vaginal cream Place 1 applicator vaginally at bedtime. 45 g 0   ZINC SULFATE-VITAMIN C MT Use as directed in the mouth or throat.     zolpidem (AMBIEN) 10 MG tablet Take 10 mg by mouth at bedtime as needed.     No current facility-administered medications on file prior to visit.     ALLERGIES: Allergies  Allergen Reactions   Metronidazole Anaphylaxis and Hives   Pollen Extract    Diflucan [Fluconazole] Rash    Broke out in a rash with hives around her mouth, hands, face.    FAMILY HISTORY: Family History  Problem Relation Age of Onset   Hypertension Mother    Hypercholesterolemia Mother    Colon polyps Maternal Grandmother    Colon cancer Neg Hx    Esophageal cancer Neg Hx    Stomach cancer Neg Hx    Rectal cancer Neg Hx       Objective:  Blood pressure 105/75, pulse (!) 110, height 5\' 2"  (1.575 m), weight 170 lb 6.4 oz (77.3 kg), SpO2 98 %. General: No acute distress.  Patient appears well-groomed.   Head:  Normocephalic/atraumatic Eyes:  Fundi examined but not visualized Neck: supple, no paraspinal tenderness, full range of motion Heart:  Regular rate and rhythm Lungs:  Clear to auscultation bilaterally Back: No paraspinal tenderness Neurological Exam: alert and oriented to person, place, and time.  Speech fluent and not dysarthric, language intact.  Mildly decreased hearing on left.  Otherwise, CN II-XII intact. Bulk and tone normal, muscle strength 5/5 throughout.  Sensation to light touch intact.  Deep tendon reflexes 2+ throughout.  Finger to nose testing intact.  Gait normal, Romberg negative.   Shon Millet, DO  CC: Katha Cabal, DO

## 2021-10-29 ENCOUNTER — Ambulatory Visit (INDEPENDENT_AMBULATORY_CARE_PROVIDER_SITE_OTHER): Payer: BLUE CROSS/BLUE SHIELD | Admitting: Neurology

## 2021-10-29 ENCOUNTER — Ambulatory Visit: Payer: PRIVATE HEALTH INSURANCE

## 2021-10-29 VITALS — BP 105/75 | HR 110 | Ht 62.0 in | Wt 170.4 lb

## 2021-10-29 DIAGNOSIS — R279 Unspecified lack of coordination: Secondary | ICD-10-CM

## 2021-10-29 DIAGNOSIS — M6281 Muscle weakness (generalized): Secondary | ICD-10-CM | POA: Diagnosis not present

## 2021-10-29 DIAGNOSIS — G43009 Migraine without aura, not intractable, without status migrainosus: Secondary | ICD-10-CM

## 2021-10-29 DIAGNOSIS — E236 Other disorders of pituitary gland: Secondary | ICD-10-CM

## 2021-10-29 DIAGNOSIS — H9312 Tinnitus, left ear: Secondary | ICD-10-CM

## 2021-10-29 DIAGNOSIS — M62838 Other muscle spasm: Secondary | ICD-10-CM

## 2021-10-29 DIAGNOSIS — D333 Benign neoplasm of cranial nerves: Secondary | ICD-10-CM | POA: Diagnosis not present

## 2021-10-29 MED ORDER — TOPIRAMATE 25 MG PO TABS: 25.0000 mg | ORAL_TABLET | Freq: Every day | ORAL | 5 refills | Status: AC

## 2021-10-29 MED ORDER — RIZATRIPTAN BENZOATE 10 MG PO TABS: 10.0000 mg | ORAL_TABLET | ORAL | 5 refills | Status: AC | PRN

## 2021-10-30 ENCOUNTER — Encounter: Payer: Self-pay | Admitting: Neurology

## 2021-10-31 ENCOUNTER — Other Ambulatory Visit: Payer: Self-pay

## 2021-10-31 DIAGNOSIS — H9312 Tinnitus, left ear: Secondary | ICD-10-CM

## 2021-10-31 NOTE — Progress Notes (Signed)
Referral ENT fxed Via [facility] to [place] office

## 2021-11-05 ENCOUNTER — Ambulatory Visit: Payer: PRIVATE HEALTH INSURANCE | Attending: Obstetrics and Gynecology

## 2021-11-05 ENCOUNTER — Telehealth: Payer: Self-pay

## 2021-11-05 DIAGNOSIS — M5440 Lumbago with sciatica, unspecified side: Secondary | ICD-10-CM | POA: Diagnosis present

## 2021-11-05 DIAGNOSIS — R279 Unspecified lack of coordination: Secondary | ICD-10-CM | POA: Diagnosis present

## 2021-11-05 DIAGNOSIS — M6281 Muscle weakness (generalized): Secondary | ICD-10-CM | POA: Diagnosis not present

## 2021-11-05 DIAGNOSIS — G8929 Other chronic pain: Secondary | ICD-10-CM | POA: Insufficient documentation

## 2021-11-05 DIAGNOSIS — M62838 Other muscle spasm: Secondary | ICD-10-CM | POA: Diagnosis present

## 2021-11-05 NOTE — Therapy (Signed)
OUTPATIENT PHYSICAL THERAPY TREATMENT NOTE   Patient Name: Kristin Daniels MRN: [id number] DOB:30-Jan-1976, 46 y.o., female Today's Date: 11/05/2021  PCP: Lyndee Hensen, DO REFERRING PROVIDER: Aletha Halim, MD  END OF SESSION:   PT End of Session - 11/05/21 1101     Visit Number 10    Date for PT Re-Evaluation 11/21/21    Authorization Type CC[employer]    Authorization Time Period 09/04/21-11/26/21    Authorization - Visit Number 9    Authorization - Number of Visits 12    PT Start Time 1100    PT Stop Time 1140    PT Time Calculation (min) 40 min    Activity Tolerance Patient tolerated treatment well    Behavior During Therapy WFL for tasks assessed/performed                  Past Medical History:  Diagnosis Date   Acid reflux    Colon polyp    H. pylori infection    History of seasonal allergies    Hypercholesterolemia    Migraines    Pituitary tumor    Thyroid nodule    Vaginal Pap smear, abnormal    Past Surgical History:  Procedure Laterality Date   CESAREAN SECTION     OTHER SURGICAL HISTORY     hemangio removed at child and had something added to head to help indentation   Patient Active Problem List   Diagnosis Date Noted   Vaginal candidiasis 05/21/2021   Oral candidiasis 05/21/2021   Bilateral leg pain 05/21/2021   Lumbar radiculopathy, acute 04/10/2021   Costochondral chest pain 12/01/2020   Pain of right lower leg 12/01/2020   Abdominal wall fluid collections 12/01/2020   Seasonal allergic rhinitis due to pollen 08/28/2020   Acne 07/26/2020   Physically well but worried 07/26/2020   H. 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 She feels like she is urinating every 2 hours. She expresses desire to continue pelvic floor PT in the future when other medical conditions are resolved/stable.   PAIN:  Are you having pain? Yes- 8/10, stress is exacerbating pain in whole body - worse in arms and legs, was helped by using body roller  SUBJECTIVE STATEMENT 08/29/21: Pt states that she has been having difficulty emptying bladder completely due to intermittent stream. She sits down to urinate, then stream stops and she has to bear down to finish. Rt lower quadrant pain not present any longer since cyst is gone. She was having low back pain and went to PT, which helped some.  Fluid intake: Yes: reports she drinks a lot of water - occasional alcohol and carbonated beverages     Patient confirms  identification and approves PT to assess pelvic floor and treatment Yes     PAIN:  Are you having pain? No       PRECAUTIONS: None   WEIGHT BEARING RESTRICTIONS No   FALLS:  Has patient fallen in last 6 months? No   LIVING ENVIRONMENT: Lives with: lives with their family Lives in: House/apartment     OCCUPATION: not currently working   PLOF: Independent   PATIENT GOALS decrease pain and improve bladder emptying   PERTINENT HISTORY:   Sexual abuse: No   BOWEL MOVEMENT Pain with bowel movement: No Type of bowel movement:Frequency 1x/day and Strain Yes Fully  empty rectum: Yes: - Leakage: No Pads: No Fiber supplement: Yes: -   URINATION Pain with urination: No Fully empty bladder: No Stream:  intermittent, has to strain to completely empty Urgency: No Frequency: every hour Leakage:  none Pads: No   INTERCOURSE Pain with intercourse: During Penetration Ability to have vaginal penetration:  Yes: she will get sharp shooting pains and dull achiness - no pain after [place]: no pain     PREGNANCY Vaginal deliveries 0 Tearing No C-section deliveries 1 - 2011 Currently pregnant No         OBJECTIVE 11/05/21: tenderness in superficial pelvic floor muscles 9-3 o'clock with some increased urgency; mild deep pelvic floor tenderness with associated pressure; pelvic floor strength 3/5; endurance 10 seconds at 3/10 with large improvement in relaxation after contraction; 6 repeat contractions with poor coordination with subsequent contractions with no relaxation; improved abdominal tone, but scar tissue restriction still present.    09/13/21: Minimal anterior vaginal wall laxity, pelvic floor strength 2-3/5 with poor coordination and relaxation, vaginal dryness, discomfort and restriction with palpation of bil levator ani and sphincter urethrae.    08/29/21:    DIAGNOSTIC FINDINGS:  MRI 04/2018 demonstrates Rt ovarian cyst 4cm Recent US with multiple uterine fibroids   COGNITION:            Overall cognitive status: Within functional limits for tasks assessed                          SENSATION:            Light touch: Appears intact            Proprioception: Appears intact   MUSCLE LENGTH: Hamstrings: Right dec deg; Left dec deg Marcello Moores test: Right WNL deg; Left WNL deg Piriformis dec bil     FUNCTIONAL TESTS:  Squat: lt weight shift Single leg stance: 4 sec bil GAIT: increased lumbar lordosis/stiffness   POSTURE:  Rt facing sacrum   LUMBARAROM/PROM Flexion 75%, extension 25% with pain bil buttocks, bil rotation 50% with lumbar  tightness, bil side bend 75% with bil lumbar tightness   LE ROM:       LE MMT:                         Bil grossly 3/5   PELVIC MMT:  Not assessed today           PALPATION:   General  2 finger width diastasis with no distortion; significant cesarean scar tissue restriction; high tone in abdominals; tenderness to palpation throughout lumbar paraspinals, glutes, and lower abdomen                 External Perineal Exam NA  Internal Pelvic Floor NA   TONE: NA   PROLAPSE: NA   TODAY'S TREATMENT 11/05/21: D/C reassessment Neuromuscular re-education: Pelvic floor contraction training: No emotional/communication barriers or cognitive limitation. Patient is motivated to learn. Patient understands and agrees with treatment goals and plan. PT explains patient will be examined in standing, sitting, and lying down to see how their muscles and joints work. When they are ready, they will be asked to remove their underwear so PT can examine their perineum. The patient is also given the option of providing their own chaperone as one is not provided in our facility. The patient also has the right and is explained the right to defer or refuse any part of the evaluation or treatment including the internal exam. With the patient's consent, PT will use one gloved finger to gently assess the muscles of the pelvic floor, seeing how well it contracts and relaxes and if there is muscle symmetry. After, the patient will get dressed and PT and patient will discuss exam findings and plan of care. PT and patient discuss plan of care, schedule, attendance policy and HEP activities. Pelvic floor relaxation training after contractions with breath coordination via internal vaginal exam  Self-care: D/C planning Review of HEP and all lifestyle modifications including bladder training, urge suppression technique, double-voiding   TREATMENT 10/29/21 Neuromuscular re-education: Core  retraining:  Transversus abdominus training with multimodal cues for improved motor control and breath coordination Core facilitation: Supine march 2 x 10 Supine leg extensions 10x bil Exercises: Stretches/mobility: Cat/cow 2 x 10 Piriformis stretch 60 sec  Self-care: D/C planning Lubricants/vaginal dryness - talking with MD Hep compliance while she is not in PT   TREATMENT 10/22/21 Exercises: Stretches/mobility: Swiss ball up wall 10x Swiss ball seated routine: circles, lateral glides, hip shifts, marching, pelvic tilt, lateral flexion Kneeling hip flexor stretch 60 sec bil Wide stance thoracic openers using block 10x bil Strengthening: Self-care: Role of stress on the body, stress management Reviewed Mindfulness and importance especially at this time      PATIENT EDUCATION:  Education details: Exercise/HEP review; D/C planning Person educated: Patient Education method: Consulting civil engineer, Demonstration, Tactile cues, Verbal cues, and Handouts Education comprehension: verbalized understanding     HOME EXERCISE PROGRAM: [id number]   ASSESSMENT:   CLINICAL IMPRESSION:  Patient overall having made good progress in initial PT sessions demonstrated by improved ability to urinate with consistent stream, decreased dyspareunia, increased pelvic floor strength and coordination, and improving abdominal muscle tone. She does still have superficial and deep pelvic floor tension with reproduction of urgency and pressure upon palpation (superficial worse) and decreased coordination with repeat contractions. We discussed importance of continuing to work on pelvic floor relaxation training and worked on this today as well; she was told that she is doing this very well after single contraction, but when activities get more complex or she cannot provide 100% focus to pelvic floor bulge/relaxation, she looses her coordination. Believe addressing this will continue to help improve ease of urination -  patient would like to be able to urinate with consistent stream without use of double-voiding. She is discharging at this time due to extended travel for new medical diagnosis, but plans to return to continue with pelvic floor PT in the fall, which will be beneficial. She was encouraged to continue working on all HEP items and start including regular vulvovaginal massage. She was instructed to reach out with any questions or concerns.      OBJECTIVE IMPAIRMENTS decreased activity tolerance, decreased cognition, decreased  coordination, decreased endurance, decreased mobility, decreased strength, hypomobility, increased fascial restrictions, increased muscle spasms, impaired flexibility, impaired tone, postural dysfunction, and pain.    ACTIVITY LIMITATIONS  interpersonal relationships; emptying bladder .    PERSONAL FACTORS 1-2 comorbidities: cesarean delivery 2011 and liposuction 2022  are also affecting patient's functional outcome.      REHAB POTENTIAL: Good   CLINICAL DECISION MAKING: Evolving/moderate complexity   EVALUATION COMPLEXITY: Moderate     GOALS: Goals reviewed with patient? Yes   SHORT TERM GOALS: Target date: 09/26/2021 - updated 11/05/21   Pt will be independent with HEP.    Baseline: Goal status: MET 10/08/21   2.  Pt will be able to correctly perform diaphragmatic breathing and appropriate pressure management in order to prevent worsening vaginal wall laxity and improve pelvic floor A/ROM.    Baseline:  Goal status: MET 10/08/21   3.  Pt will be able to teach back and utilize urge suppression technique in order to help reduce number of trips to the bathroom.     Baseline:  Goal status: MET 11/05/21       LONG TERM GOALS: Target date: 11/21/2021 - updated 11/05/21   Pt will be independent with advanced HEP.    Baseline:  Goal status: IN PROGRESS 11/05/21   2.  Pt will demonstrate normal pelvic floor muscle tone and A/ROM, able to achieve 4/5 strength with  contractions and 10 sec endurance, in order to provide appropriate lumbopelvic support in functional activities.    Baseline: Still working on active relaxation/reverse kegel Goal status: IN PROGRESS 11/05/21   3.  Pt will report 0/10 pain with vaginal penetration in order to improve intimate relationship with partner.     Baseline: better with deep breathing techniques and mindfulness; she states that pain was 6/10 Goal status: IN PROGRESS 11/05/21   4.  Pt will report complete bladder emptying, indicating improved pelvic floor relaxation/coordination.  Baseline: with utilizing double voiding and relaxation training, she feels 60% better, but without these techniques she feels the same Goal status: IN PROGRESS 11/05/21   5.  Pt will be able to go 2-3 hours in between voids without urgency or incontinence in order to improve QOL and perform all functional activities with less difficulty.    Baseline: She is getting closer to every 2 hours consistently  Goal status: IN PROGRESS 11/05/21   6.  Pt will demonstrate appropriate intra-abdominal pressure management to avoid worsening vaginal wall laxity, diastasis, and pelvic floor tension with all functional activities/strengthening.  Baseline: working hard on improving awareness of tension Goal status: IN PROGRESS 11/05/21   PLAN: PT FREQUENCY: NA   PT DURATION: NA   PLANNED INTERVENTIONS: DC   PLAN FOR NEXT SESSION: D/C   PHYSICAL THERAPY DISCHARGE SUMMARY  Visits from Start of Care: 10  Current functional level related to goals / functional outcomes: Goals in progress   Remaining deficits: See above   Education / Equipment: HEP   Patient agrees to discharge. Patient goals were partially met. Patient is being discharged due to  extended travel for new medical diagnosis.  Heather Roberts, PT, DPT07/03/231:19 PM

## 2021-11-05 NOTE — Telephone Encounter (Signed)
Telephone call from patient, Per patient Kristin Daniels called and lvm stating that Dr.jaffe ordered an MRI she needs to scheduled.  Advised patient Dr.jaffe hasn't order an MRI for the patient since she requested it in March.   Will ask Dr.Jaffe to see if the Correct Daniels was done with the focus on the Pituitary gland.  For right now please cancel the appt.

## 2021-11-07 NOTE — Telephone Encounter (Signed)
Patient advised of Dr.Jaffe,MRI is not indicated at this time.

## 2021-11-08 IMAGING — MG MM DIGITAL SCREENING BILAT W/ TOMO AND CAD
8 series · 8 of 24 positions shown · non-contrast
Comparison: Previous exam(s).

CLINICAL DATA: Screening.

EXAM:
DIGITAL SCREENING BILATERAL MAMMOGRAM WITH TOMOSYNTHESIS AND CAD
TECHNIQUE: Bilateral screening digital craniocaudal and mediolateral oblique
mammograms were obtained. Bilateral screening digital breast
tomosynthesis was performed. The images were evaluated with
computer-aided detection.

[R MLO synth-2D]
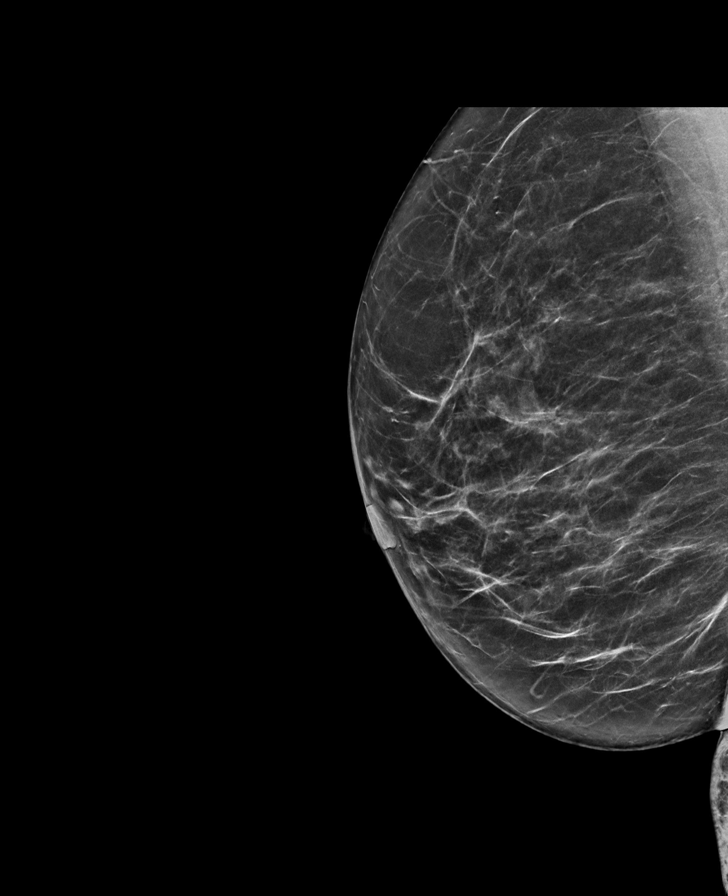

[L CC synth-2D]
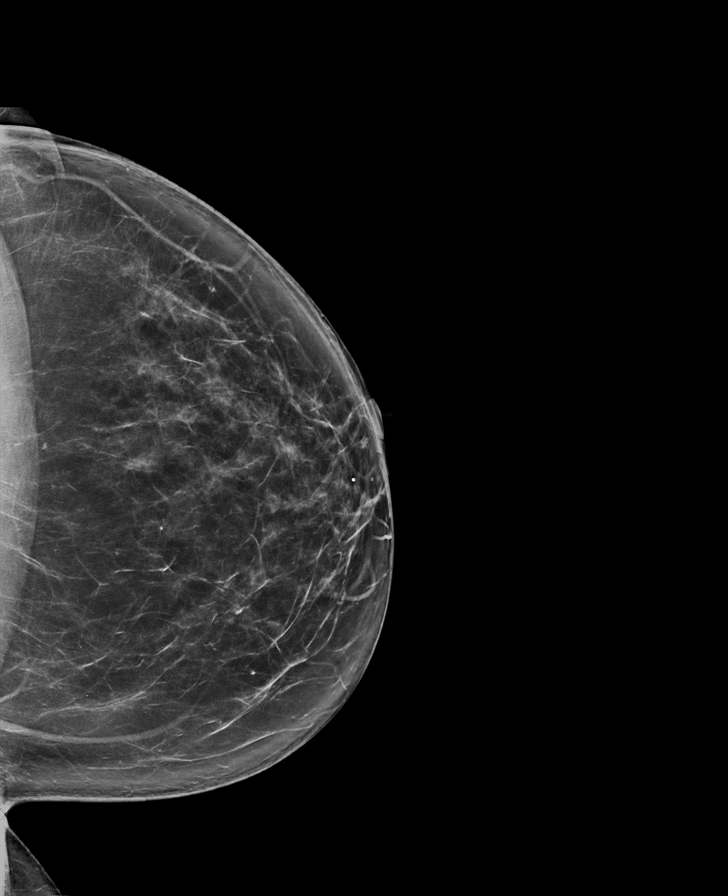

[R CC synth-2D]
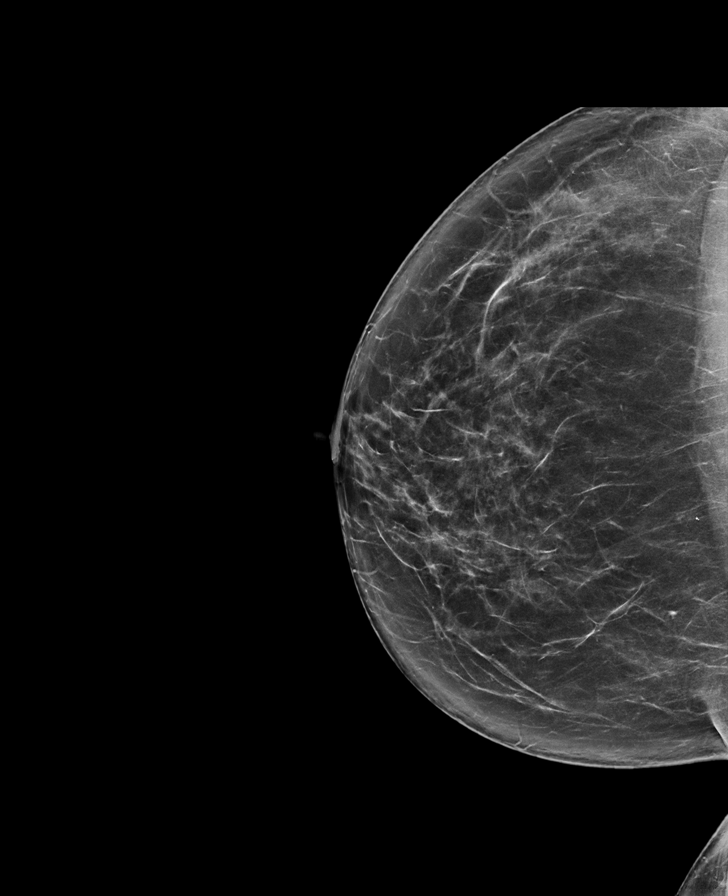

[L MLO synth-2D]
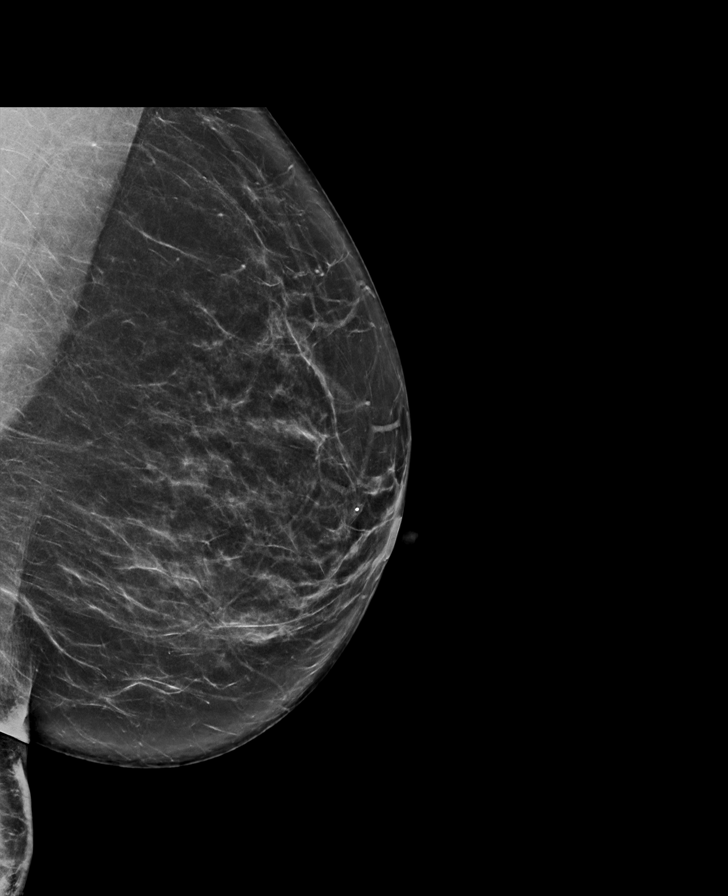

[L MLO tomo · tomo slice 41/80.0]
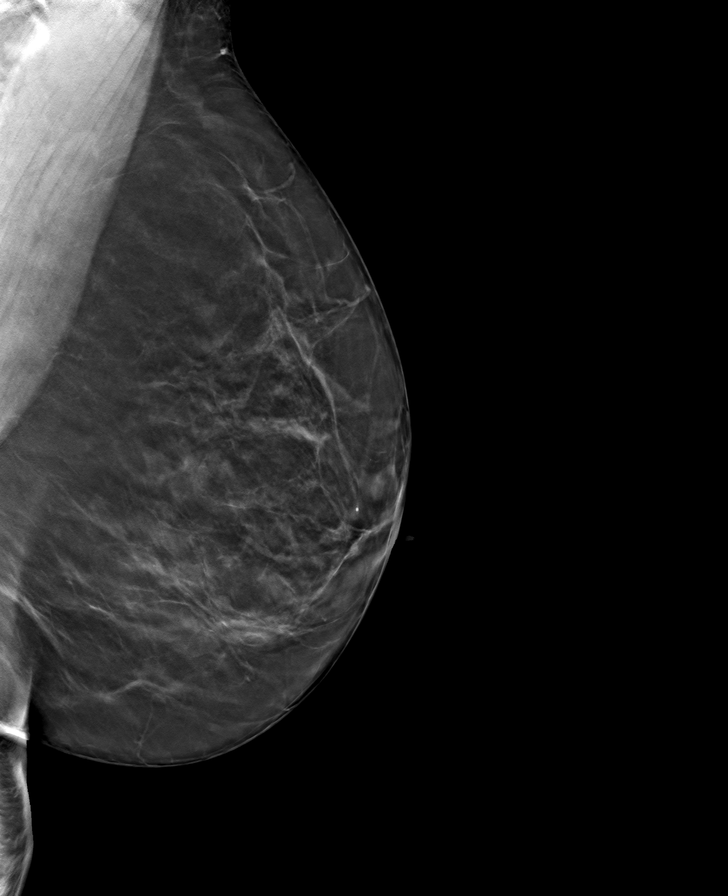

[R CC tomo · tomo slice 40/79.0]
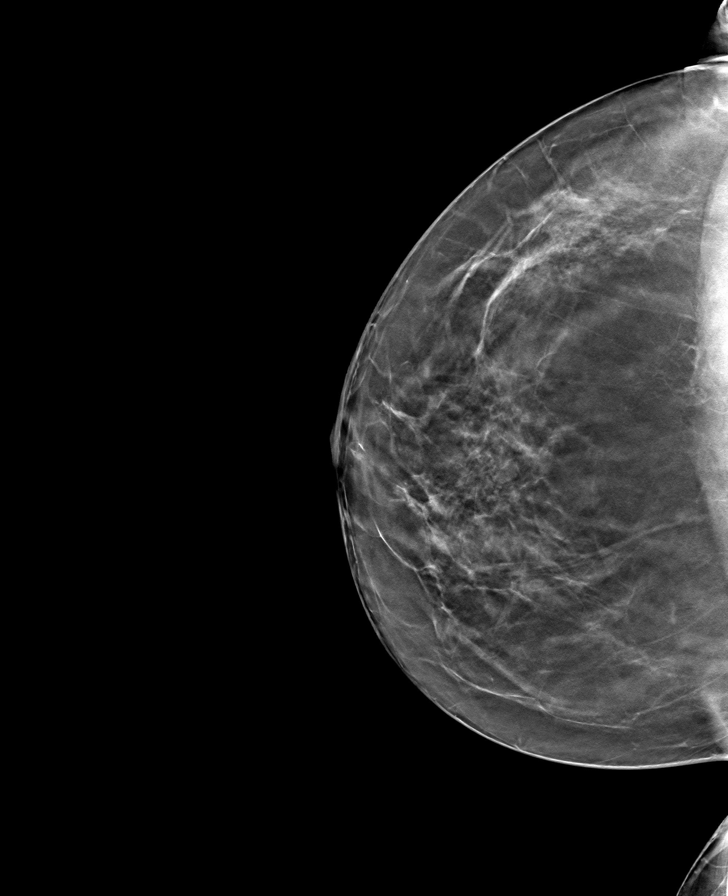

[L CC tomo · tomo slice 41/80.0]
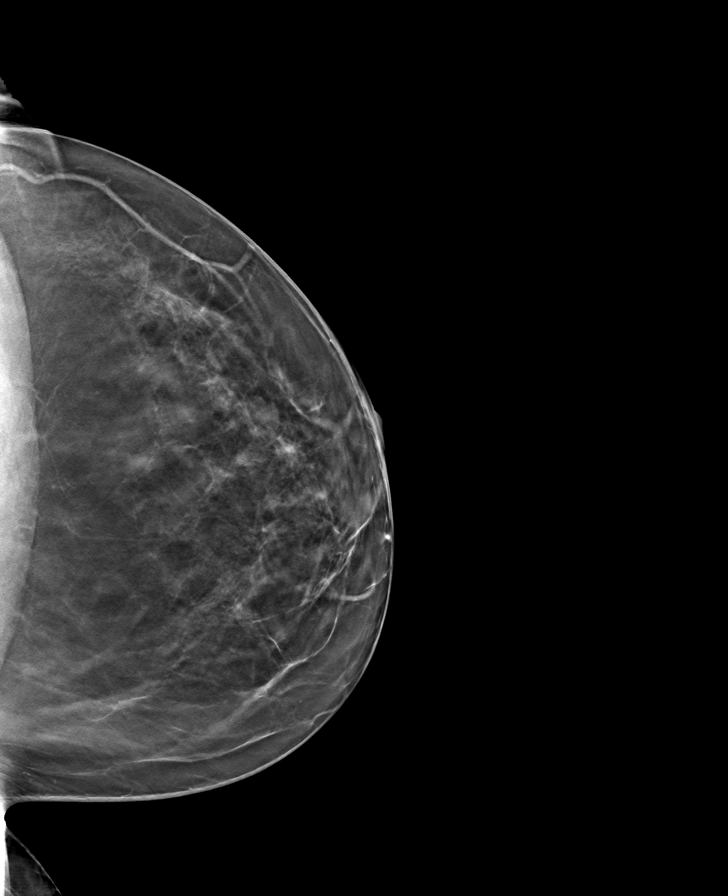

[R MLO tomo · tomo slice 37/74.0]
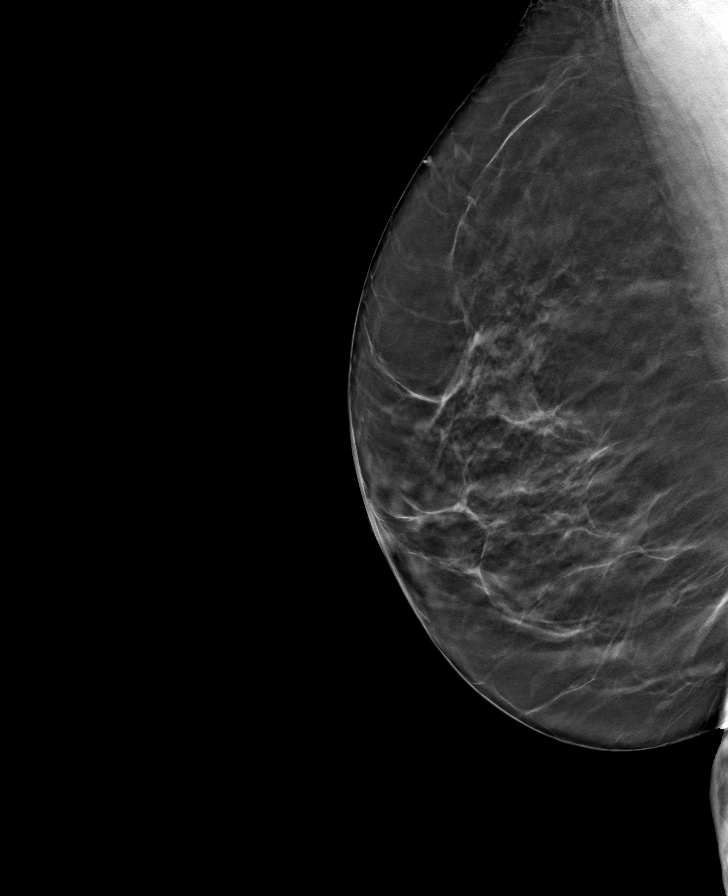

[8 of 24 positions shown; findings below may reference images not displayed]

ACR Breast Density Category b: There are scattered areas of
fibroglandular density.
FINDINGS: There are no findings suspicious for malignancy.
IMPRESSION: No mammographic evidence of malignancy. A result letter of this
screening mammogram will be mailed directly to the patient.

RECOMMENDATION:
Screening mammogram in one year. (Code:51-O-LD2)

BI-RADS CATEGORY  1: Negative.

## 2021-11-12 ENCOUNTER — Ambulatory Visit: Payer: PRIVATE HEALTH INSURANCE

## 2021-11-19 ENCOUNTER — Ambulatory Visit: Payer: PRIVATE HEALTH INSURANCE

## 2021-11-26 ENCOUNTER — Ambulatory Visit: Payer: PRIVATE HEALTH INSURANCE

## 2021-12-03 ENCOUNTER — Ambulatory Visit: Payer: PRIVATE HEALTH INSURANCE

## 2021-12-04 ENCOUNTER — Other Ambulatory Visit: Payer: Medicaid Other

## 2021-12-11 ENCOUNTER — Encounter: Payer: Self-pay | Admitting: Endocrinology

## 2021-12-13 ENCOUNTER — Other Ambulatory Visit: Payer: Self-pay | Admitting: Endocrinology

## 2021-12-13 DIAGNOSIS — E041 Nontoxic single thyroid nodule: Secondary | ICD-10-CM

## 2021-12-19 ENCOUNTER — Other Ambulatory Visit: Payer: Self-pay | Admitting: Endocrinology

## 2021-12-19 DIAGNOSIS — E041 Nontoxic single thyroid nodule: Secondary | ICD-10-CM

## 2021-12-26 ENCOUNTER — Other Ambulatory Visit: Payer: Self-pay | Admitting: Obstetrics & Gynecology

## 2021-12-26 ENCOUNTER — Other Ambulatory Visit: Payer: Self-pay | Admitting: Family Medicine

## 2021-12-26 DIAGNOSIS — Z1231 Encounter for screening mammogram for malignant neoplasm of breast: Secondary | ICD-10-CM

## 2021-12-30 ENCOUNTER — Other Ambulatory Visit: Payer: Medicaid Other

## 2021-12-31 ENCOUNTER — Other Ambulatory Visit: Payer: PRIVATE HEALTH INSURANCE

## 2021-12-31 NOTE — Progress Notes (Deleted)
  SUBJECTIVE:   CHIEF COMPLAINT / HPI:   Left acoustic neuroma/tinnitus: Was referred to ENT by neurology  They recommended   Thyroid Nodule:  Per [facility] "4.5 cm left lobe thyroid nodule that has a concerning appearance by ultrasound but has been relatively stable in size with repeatedly benign cytology in 2018, 2021, and 2023 The nodule is visible but otherwise asymptomatic"      PERTINENT  PMH / PSH:   Past Medical History:  Diagnosis Date   Acid reflux    Colon polyp    H. pylori infection    History of seasonal allergies    Hypercholesterolemia    Migraines    Pituitary tumor    Thyroid nodule    Vaginal Pap smear, abnormal     OBJECTIVE:  BP 117/83   Pulse 78   Wt 168 lb (76.2 kg)   LMP 12/09/2021   SpO2 99%   BMI 30.73 kg/m   General: NAD, pleasant, able to participate in exam Cardiac: RRR, no murmurs auscultated Respiratory: CTAB, normal WOB Abdomen: soft, non-tender, non-distended, normoactive bowel sounds Extremities: warm and well perfused, no edema or cyanosis Skin: warm and dry, no rashes noted Neuro: alert, no obvious focal deficits, speech normal Psych: Normal affect and mood  ASSESSMENT/PLAN:  No problem-specific Assessment & Plan notes found for this encounter.   No orders of the defined types were placed in this encounter.  No orders of the defined types were placed in this encounter.  No follow-ups on file. Erskine Emery, MD 01/03/2022, 1:40 PM PGY-2, [place] {    This will disappear when note is signed, click to select method of visit    :1}

## 2022-01-01 ENCOUNTER — Other Ambulatory Visit: Payer: Self-pay | Admitting: Endocrinology

## 2022-01-01 ENCOUNTER — Ambulatory Visit: Payer: Medicaid Other | Admitting: Neurology

## 2022-01-01 DIAGNOSIS — Z87898 Personal history of other specified conditions: Secondary | ICD-10-CM

## 2022-01-01 DIAGNOSIS — E221 Hyperprolactinemia: Secondary | ICD-10-CM

## 2022-01-03 ENCOUNTER — Encounter: Payer: Self-pay | Admitting: Student

## 2022-01-03 ENCOUNTER — Ambulatory Visit (INDEPENDENT_AMBULATORY_CARE_PROVIDER_SITE_OTHER): Payer: BLUE CROSS/BLUE SHIELD | Admitting: Student

## 2022-01-03 ENCOUNTER — Other Ambulatory Visit: Payer: Self-pay | Admitting: Endocrinology

## 2022-01-03 ENCOUNTER — Ambulatory Visit
Admission: RE | Admit: 2022-01-03 | Discharge: 2022-01-03 | Disposition: A | Discharge status: Home / Self Care | Payer: Self-pay | Source: Ambulatory Visit | Attending: Endocrinology | Admitting: Endocrinology

## 2022-01-03 VITALS — BP 117/83 | HR 78 | Wt 168.0 lb

## 2022-01-03 DIAGNOSIS — L709 Acne, unspecified: Secondary | ICD-10-CM | POA: Diagnosis not present

## 2022-01-03 DIAGNOSIS — N941 Unspecified dyspareunia: Secondary | ICD-10-CM | POA: Insufficient documentation

## 2022-01-03 DIAGNOSIS — D3610 Benign neoplasm of peripheral nerves and autonomic nervous system, unspecified: Secondary | ICD-10-CM | POA: Diagnosis not present

## 2022-01-03 DIAGNOSIS — M5416 Radiculopathy, lumbar region: Secondary | ICD-10-CM

## 2022-01-03 DIAGNOSIS — J301 Allergic rhinitis due to pollen: Secondary | ICD-10-CM

## 2022-01-03 DIAGNOSIS — Z8639 Personal history of other endocrine, nutritional and metabolic disease: Secondary | ICD-10-CM

## 2022-01-03 DIAGNOSIS — E041 Nontoxic single thyroid nodule: Secondary | ICD-10-CM

## 2022-01-03 MED ORDER — CETIRIZINE HCL 10 MG PO TABS: 10.0000 mg | ORAL_TABLET | Freq: Every day | ORAL | 11 refills | Status: DC

## 2022-01-03 MED ORDER — CLINDAMYCIN PHOSPHATE 1 % EX SOLN: Freq: Two times a day (BID) | CUTANEOUS | 2 refills | Status: DC | PRN

## 2022-01-03 MED ORDER — FLUTICASONE PROPIONATE 50 MCG/ACT NA SUSP: 2.0000 | Freq: Every day | NASAL | 6 refills | Status: AC

## 2022-01-03 NOTE — Assessment & Plan Note (Signed)
The patient is informed about her condition and is motivated to continue physical therapy. Provided a repeat referral to physical therapy for pelvic floor muscle strengthening, which patient will pursue once her PT returns from maternity leave in October. Advised patient not to use vaginal estrogen cream due to risk of adverse effects. Recommended Replens vaginal moisturizer for improvement of symptoms.

## 2022-01-03 NOTE — Assessment & Plan Note (Addendum)
Patient's symptoms in the setting of known lumbar disc herniation are consistent with radiculopathy. Patient does not report red flag neurological symptoms and conservative treatment with physical therapy is appropriate. Patient prefers to pursue physical therapy over surgery at this time. Provided patient with referral for PT. Provided return precautions if patient experiences new foot drop or urinary/bowel incontinence.

## 2022-01-03 NOTE — Assessment & Plan Note (Addendum)
Patient is satisfied with current treatment regimen. Refilled patient's clindamycin solution.

## 2022-01-03 NOTE — Assessment & Plan Note (Signed)
Patient will continue to receive hearing tests and MRI studies yearly. No further management necessary at this time, but will continue to monitor as patient follows with specialist.

## 2022-01-03 NOTE — Progress Notes (Signed)
SUBJECTIVE:   CHIEF COMPLAINT / HPI:   Kristin Daniels is a 46 y.o. female with a past medical history of lumbar disc herniation, thyroid nodule, benign pituitary adenoma, dyspareunia, presenting to the clinic for a chief complaint of back pain but with multiple secondary concerns the care of which she wishes to coordinate with her PCP.  1. History of thyroid nodule The patient has been diagnosed with benign follicular adenoma of the thyroid following biopsy in [place]. The patient states that she was advised by an ENT or otolaryngologist in [facility] that partial thyroidectomy is indicated, but she prefers to pursue RFA of the thyroid nodule rather than surgery. Her specialists have agreed that this is a viable course of action and she is planning to schedule an RFA appointment here in [place].  2. Lumbar radiculopathy, acute Patient reports approximately a year of chronic low grade baseline lumbar back pain with occasional episodes of severe 7/10 electrical shooting pains that travel down her right leg to her knee. She reports that her baseline pain is over the midline of the lumbar spine and over her right hip as well. The patient states that there is a clear distinction between her baseline pain and her "nerve pain." She is taking gabapentin and Motrin for the pain and has previously tried Advil, Alleve, and Tylenol as well. Notably, she takes the gabapentin PRN for pain, not daily. The patient denies urinary or bowel incontinence, foot drop, stumbling, or lower extremity weakness. Chart review reveals that the patient had a lumbar spine MRI in December 2022 showing an L5-S1 focal disc degeneration with disc impingement of the L5 nerve root.  3. Dyspareunia in female The patient has a known history of dyspareunia and she has seen an obygyn about this problem, who recommended physical therapy of her pelvic floor muscles. She states that she was doing the physical therapy and it was helping, but her PT  took maternity leave before the patient was able to finish her therapy course. She would like to resume her therapy once her PT returns from leave in October and requests another referral as the previous one may have expired. Additionally, the patient inquires about using vaginal estrogen cream at the vaginal introitus to help with symptoms as her PT told her that this may be an option.  4. Seasonal allergic rhinitis due to pollen The patient notes that she has been having nasal congestion and increased sinus pressure. She requests a refill for her Zyrtec and Flonase. She denies any recent fevers, green nasal drainage, or frank pain in her sinuses.  5. Acne, unspecified acne type Patient reports that her current clindamycin 1% treatment has been helping for her cystic acne and she wishes to continue the treatment, but needs a refill.  6. Acoustic schwannoma Patient was diagnosed with acoustic schwannoma and recent MRI in 10/2021 showed schwannoma stable from 2021. Patient reports that she saw ENT in [place] this past summer and was told that the schwannoma is not large enough to warrant operative treatment as that would likely result in hearing loss. At this time, patient is bothered by vertigo and balance issues that she associates with this tumor. Patient will need to receive yearly MRI studies and hearing tests to monitor growth of schwannoma.   PERTINENT  PMH / PSH:  Outpatient Medications Prior to Visit  Medication Sig   ALPRAZolam (XANAX) 1 MG tablet Take 1 mg by mouth daily.   ARIPiprazole (ABILIFY) 30 MG tablet Take 30  mg by mouth daily.   cabergoline (DOSTINEX) 0.5 MG tablet TAKE 1 TAB ON SUNDAYS AND HALF TABLET ON THURSDAYS   cyclobenzaprine (FLEXERIL) 5 MG tablet Take 1 tablet (5 mg total) by mouth 3 (three) times daily as needed for muscle spasms.   desonide (DESOWEN) 0.05 % cream Apply topically 2 (two) times daily.   gabapentin (NEURONTIN) 100 MG capsule Take 2 capsules (200 mg  total) by mouth 3 (three) times daily.   hydrocortisone 2.5 % cream Apply topically 2 (two) times daily.   Ibrexafungerp Citrate 150 MG TABS Take 2 tablets by mouth 2 (two) times daily.   Miconazole Nitrate Applicator 132 & 2 MG-% (9GM) KIT Place 100 mg vaginally at bedtime.   Multiple Vitamin (MULTIVITAMIN) tablet Take 1 tablet by mouth daily.   naproxen sodium (ANAPROX DS) 550 MG tablet Take 1 tablet (550 mg total) by mouth 2 (two) times daily as needed.   nortriptyline (PAMELOR) 50 MG capsule TAKE 2 CAPSULES (100 MG TOTAL) BY MOUTH AT BEDTIME.   Olopatadine HCl 0.2 % SOLN Apply 1 drop to eye daily.   Omega-3 Fatty Acids (FISH OIL PO) Take by mouth.   omeprazole (PRILOSEC) 40 MG capsule Take 1 capsule (40 mg total) by mouth daily.   Plecanatide (TRULANCE) 3 MG TABS Take 1 tablet by mouth daily.   Riboflavin-Magnesium-Feverfew 100-90-25 MG TABS Take 100 mg by mouth daily.   rizatriptan (MAXALT) 10 MG tablet Take 1 tablet (10 mg total) by mouth as needed for migraine. May repeat after 2 hours if needed.  Maximum 2 tablets in 24 hours.   topiramate (TOPAMAX) 25 MG tablet Take 1 tablet (25 mg total) by mouth at bedtime.   ZINC SULFATE-VITAMIN C MT Use as directed in the mouth or throat.   zolpidem (AMBIEN) 10 MG tablet Take 10 mg by mouth at bedtime as needed.   cetirizine (ZYRTEC) 10 MG tablet Take 1 tablet (10 mg total) by mouth daily.   clindamycin (CLEOCIN-T) 1 % external solution Apply topically 2 (two) times daily as needed.   fluticasone (FLONASE) 50 MCG/ACT nasal spray Place 2 sprays into both nostrils daily.   Past Medical History:  Diagnosis Date   Acid reflux    Colon polyp    H. pylori infection    History of seasonal allergies    Hypercholesterolemia    Migraines    Pituitary tumor    Thyroid nodule    Vaginal Pap smear, abnormal     Family History  Problem Relation Age of Onset   Hypertension Mother    Hypercholesterolemia Mother    Colon polyps Maternal Grandmother     Colon cancer Neg Hx    Esophageal cancer Neg Hx    Stomach cancer Neg Hx    Rectal cancer Neg Hx     Social History   Socioeconomic History   Marital status: Single   Number of children: 1  Occupational History   Occupation: not working  Tobacco and Vaping Use   Smoking status: Never  Substance and Sexual Activity   Alcohol use: Yes    Comment: holidays   Drug use: No   Sexual activity: Yes    Birth control/protection: Condom    OBJECTIVE:   BP 117/83   Pulse 78   Wt 168 lb (76.2 kg)   LMP 12/09/2021   SpO2 99%   BMI 30.73 kg/m   General: Age-appropriate, resting comfortably in chair, alert and at baseline. HEENT: Sclera without injection or icterus. MMM.  Sensation of fullness over facial sinuses bilaterally. TMs nonbulging and nonerythematous bilaterally, but with possible chronic scarring or increased cartilaginous prominences. Neck: Supple. No LAD. Nontender thyroid nodule palpable on left side of neck. Cardiovascular: Regular rate and rhythm. Normal S1/S2. No murmurs, rubs, or gallops appreciated. 2+ radial pulses. Pulmonary: Clear bilaterally to ascultation. No increased WOB, no accessory muscle usage on room air. No wheezes, rales, or crackles. Abdominal: No tenderness to deep or light palpation. No rebound or guarding. No HSM. MSK: Positive straight leg raise test on R side. Tenderness to palpation just lateral to lower lumbar spine on R side. Mild tenderness to palpation over trochanter. No foot drop. Normal patellar and Achilles DTRs bilaterally. Skin: Warm and dry. Extremities: No peripheral edema bilaterally.   ASSESSMENT/PLAN:   Ivery Michalski is a 46 y.o. female with a past medical history of lumbar disc herniation, thyroid nodule, benign pituitary adenoma, dyspareunia, presenting to the clinic for a chief complaint of back pain as well as multiple secondary chronic concerns and with findings of positive right-sided straight leg test on physical  exam.  Acne Patient is satisfied with current treatment regimen. Refilled patient's clindamycin solution.  Lumbar radiculopathy, acute Patient's symptoms in the setting of known lumbar disc herniation are consistent with radiculopathy. Patient does not report red flag neurological symptoms and conservative treatment with physical therapy is appropriate. Patient prefers to pursue physical therapy over surgery at this time. Provided patient with referral for PT. Provided return precautions if patient experiences new foot drop or urinary/bowel incontinence.  Seasonal allergic rhinitis due to pollen Patient does not report any symptoms concerning for acute sinusitis at this time. Refilled patient's Flonase and Zyrtec and requested patient follow up if symptoms worsen or do not improve in 2-4 weeks.  History of thyroid nodule Patient has consulted with her otolaryngologist and surgical oncologist in [place] and has decided to pursue RFA therapy rather than partial thyroidectomy. Patient feels informed and is scheduling an appointment with a Dr. Shuttle(?) at [facility].  Dyspareunia in female The patient is informed about her condition and is motivated to continue physical therapy. Provided a repeat referral to physical therapy for pelvic floor muscle strengthening, which patient will pursue once her PT returns from maternity leave in October. Advised patient not to use vaginal estrogen cream due to risk of adverse effects. Recommended Replens vaginal moisturizer for improvement of symptoms.  Benign schwannoma Patient will continue to receive hearing tests and MRI studies yearly. No further management necessary at this time, but will continue to monitor as patient follows with specialist.    Chrisa Hassan Katy Fitch, [place]

## 2022-01-03 NOTE — Assessment & Plan Note (Signed)
Patient does not report any symptoms concerning for acute sinusitis at this time. Refilled patient's Flonase and Zyrtec and requested patient follow up if symptoms worsen or do not improve in 2-4 weeks.

## 2022-01-03 NOTE — Patient Instructions (Addendum)
It was great to see you today! Thank you for choosing [facility] for your primary care. Kristin Daniels was seen for follow up.  Today we addressed:   Please continue with specialist treatment and see OB provider for follow up  You can start PT for your back now and I have placed an order for pelvic floor therapy  I have sent in refills as well   If you haven't already, sign up for My Chart to have easy access to your labs results, and communication with your primary care physician.  I recommend that you always bring your medications to each appointment as this makes it easy to ensure you are on the correct medications and helps [place] not miss refills when you need them. Call the clinic at [phone] if your symptoms worsen or you have any concerns.  You should return to our clinic Return if symptoms worsen or fail to improve, for multiple concerns, back pain, pelvic floor pain . Please arrive 15 minutes before your appointment to ensure smooth check in process.  We appreciate your efforts in making this happen.  Thank you for allowing me to participate in your care, Erskine Emery, MD 01/03/2022, 2:49 PM PGY-2, [place]

## 2022-01-03 NOTE — Assessment & Plan Note (Addendum)
Patient has consulted with her otolaryngologist and surgical oncologist in [place] and has decided to pursue RFA therapy rather than partial thyroidectomy. Patient feels informed and is scheduling an appointment with a Dr. Shuttle(?) at [facility].

## 2022-01-11 ENCOUNTER — Other Ambulatory Visit: Payer: Self-pay | Admitting: Interventional Radiology

## 2022-01-11 ENCOUNTER — Ambulatory Visit
Admission: RE | Admit: 2022-01-11 | Discharge: 2022-01-11 | Disposition: A | Discharge status: Home / Self Care | Payer: Medicaid Other | Source: Ambulatory Visit | Attending: Endocrinology | Admitting: Endocrinology

## 2022-01-11 ENCOUNTER — Encounter: Payer: Self-pay | Admitting: *Deleted

## 2022-01-11 DIAGNOSIS — E041 Nontoxic single thyroid nodule: Secondary | ICD-10-CM

## 2022-01-11 HISTORY — PX: IR RADIOLOGIST EVAL & MGMT: IMG5224

## 2022-01-11 NOTE — Consult Note (Signed)
Chief Complaint: Patient was seen in virtual telephone consultation today for symptomatic thyroid nodule  Referring Physician(s): Kumar,Ajay  History of Present Illness: Kristin Daniels is a 46 y.o. female with a symptomatic left thyroid nodule.  This was first diagnosed in 2004, and at that time was approximately 2 cm.  She has remained euthyroid throughout her life.  The nodule has slowly enlarged, now measuring over 4 cm.  She underwent FNA in [place] last month which resulted negative.  She endorses new pain that is occasionally associated with her nodule.  She occasionally has a rhaspy voice, which is new.  She denies dysphagia.  She states that the nodule has gradually become readily noticeable and palpable.   She recently saw an ENT in [facility] for the nodule who recommended hemithyroidectomy.  She found out about thyroid RFA online and was graciously referred to me by Dr. Dwyane Dee for evaluation.   Thyroid Symptom Score: 7/10  Thyroid Cosmetic Score:  Readily detected cosmetic problem (Grade 4).  Past Medical History:  Diagnosis Date   Acid reflux    Colon polyp    H. pylori infection    History of seasonal allergies    Hypercholesterolemia    Migraines    Pituitary tumor    Thyroid nodule    Vaginal Pap smear, abnormal     Past Surgical History:  Procedure Laterality Date   CESAREAN SECTION     OTHER SURGICAL HISTORY     hemangio removed at child and had something added to head to help indentation    Allergies: Metronidazole, Pollen extract, and Diflucan [fluconazole]  Medications: Prior to Admission medications   Medication Sig Start Date End Date Taking? Authorizing Provider  ALPRAZolam Duanne Moron) 1 MG tablet Take 1 mg by mouth daily. 06/28/20   [provider]  ARIPiprazole (ABILIFY) 30 MG tablet Take 30 mg by mouth daily. 07/19/20   [provider]  cabergoline (DOSTINEX) 0.5 MG tablet TAKE 1 TAB ON SUNDAYS AND HALF TABLET ON THURSDAYS 01/02/22    Elayne Snare, MD  cetirizine (ZYRTEC) 10 MG tablet Take 1 tablet (10 mg total) by mouth daily. 01/03/22   Erskine Emery, MD  clindamycin (CLEOCIN-T) 1 % external solution Apply topically 2 (two) times daily as needed. 01/03/22   Erskine Emery, MD  cyclobenzaprine (FLEXERIL) 5 MG tablet Take 1 tablet (5 mg total) by mouth 3 (three) times daily as needed for muscle spasms. 05/21/21   Gladys Damme, MD  desonide (DESOWEN) 0.05 % cream Apply topically 2 (two) times daily. 05/29/20   Lavonna Monarch, MD  FIBER PO Take by mouth.    [provider]  fluticasone (FLONASE) 50 MCG/ACT nasal spray Place 2 sprays into both nostrils daily. 01/03/22   Erskine Emery, MD  gabapentin (NEURONTIN) 100 MG capsule Take 2 capsules (200 mg total) by mouth 3 (three) times daily. 04/23/21 10/29/21  Regan Lemming, MD  hydrocortisone 2.5 % cream Apply topically 2 (two) times daily. 06/02/20   Lavonna Monarch, MD  Ibrexafungerp Citrate 150 MG TABS Take 2 tablets by mouth 2 (two) times daily. 07/07/21   Guss Bunde, MD  MAGNESIUM PO Take by mouth.    [provider]  Miconazole Nitrate Applicator 482 & 2 MG-% (9GM) KIT Place 100 mg vaginally at bedtime. 05/21/21   Gladys Damme, MD  Multiple Vitamin (MULTIVITAMIN) tablet Take 1 tablet by mouth daily.    [provider]  naproxen sodium (ANAPROX DS) 550 MG tablet Take 1 tablet (550 mg  total) by mouth 2 (two) times daily as needed. 01/11/20   Tomi Likens, Adam R, DO  nortriptyline (PAMELOR) 50 MG capsule TAKE 2 CAPSULES (100 MG TOTAL) BY MOUTH AT BEDTIME. 08/02/21   Tomi Likens, Adam R, DO  nystatin (MYCOSTATIN) 100000 UNIT/ML suspension Take 5 mLs (500,000 Units total) by mouth 4 (four) times daily. 05/21/21   Gladys Damme, MD  Olopatadine HCl 0.2 % SOLN Apply 1 drop to eye daily. 09/21/18   Myles Gip, DO  Omega-3 Fatty Acids (FISH OIL PO) Take by mouth.    [provider]  omeprazole (PRILOSEC) 40 MG capsule Take 1 capsule (40 mg total) by  mouth daily. 03/25/19   Kathrene Alu, MD  Plecanatide (TRULANCE) 3 MG TABS Take 1 tablet by mouth daily. 05/12/19   Ladene Artist, MD  Riboflavin-Magnesium-Feverfew 100-90-25 MG TABS Take 100 mg by mouth daily. 03/25/19   Kathrene Alu, MD  rizatriptan (MAXALT) 10 MG tablet Take 1 tablet (10 mg total) by mouth as needed for migraine. May repeat after 2 hours if needed.  Maximum 2 tablets in 24 hours. 10/29/21   Pieter Partridge, DO  terconazole (TERAZOL 7) 0.4 % vaginal cream Place 1 applicator vaginally at bedtime. 05/21/21   Gladys Damme, MD  topiramate (TOPAMAX) 25 MG tablet Take 1 tablet (25 mg total) by mouth at bedtime. 10/29/21   Pieter Partridge, DO  ZINC SULFATE-VITAMIN C MT Use as directed in the mouth or throat.    [provider]  zolpidem (AMBIEN) 10 MG tablet Take 10 mg by mouth at bedtime as needed. 07/19/20   [provider]     Family History  Problem Relation Age of Onset   Hypertension Mother    Hypercholesterolemia Mother    Colon polyps Maternal Grandmother    Colon cancer Neg Hx    Esophageal cancer Neg Hx    Stomach cancer Neg Hx    Rectal cancer Neg Hx     Social History   Socioeconomic History   Marital status: Single    Spouse name: Not on file   Number of children: 1   Years of education: Not on file   Highest education level: Not on file  Occupational History   Occupation: not working  Tobacco Use   Smoking status: Never   Smokeless tobacco: Never  Vaping Use   Vaping Use: Never used  Substance and Sexual Activity   Alcohol use: Yes    Comment: holidays   Drug use: No   Sexual activity: Yes    Birth control/protection: Condom  Other Topics Concern   Not on file  Social History Narrative   Right handed   Lives in two story home with son.   Social Determinants of Health   Financial Resource Strain: Not on file  Food Insecurity: Not on file  Transportation Needs: Not on file  Physical Activity: Not on file  Stress:  Not on file  Social Connections: Not on file     Review of Systems: A 12 point ROS discussed and pertinent positives are indicated in the HPI above.  All other systems are negative.  Vital Signs: LMP 12/09/2021   No physical examination was performed in lieu of virtual telephone clinic visit.  Imaging: US thyroid 01/11/22  4.4 x 2.1 x 3.1 cm = 15 cc   Labs: CBC 05/21/21 WBC 6.0, Hb 13, Hct 41.7, Plt 332  Coags None available  TFTs 07/23/21 Serum TSH 0.65 Serum free T4 0.61  Serum T3 not obtained Thyroperoxidase Antibody not obtained Thyroglobulin Antibody not obtained Calcitonin not obtained   Prior Thyroid FNA: 12/07/21 - left thyroid nodule, [place] II (per Care Everywhere, [place])  Reports of other prior left thyroid nodule FNA which were also benign     Assessment and Plan: 46 year old female with benign, symptomatic, 4.4 cm (15 cc) left thyroid nodule that has enlarged and become painful, caused mild voice changes, and with cosmetic score of 4.  She would be an excellent candidate for ultrasound guided thyroid radiofrequency ablation.    We discussed risks and benefits of radiofrequency thyroid ablation to most commonly include discomfort/pain (2.6%), followed by less commonly (all 1.0% or less) voice change, nodule rupture, infection, hypothyroidism, brachial plexus injury, hematoma, vomiting, and skin burn.   I recommend one addition thyroid FNA here in [place] prior to proceeding with the ablation procedure as older results are not readily available.    Once complete, if again benign, we will schedule left thyroid nodule RFA at [facility].    Electronically Signed: Suzette Battiest, MD 01/11/2022, 1:29 PM   I spent a total of  40 Minutes  in virtual telephone clinical consultation, greater than 50% of which was counseling/coordinating care for symptomatic thyroid nodule.

## 2022-01-15 ENCOUNTER — Ambulatory Visit: Payer: PRIVATE HEALTH INSURANCE | Attending: Obstetrics and Gynecology | Admitting: Physical Therapy

## 2022-01-15 ENCOUNTER — Other Ambulatory Visit: Payer: Self-pay

## 2022-01-15 ENCOUNTER — Encounter: Payer: Self-pay | Admitting: Student

## 2022-01-15 ENCOUNTER — Encounter: Payer: Self-pay | Admitting: Physical Therapy

## 2022-01-15 DIAGNOSIS — M6281 Muscle weakness (generalized): Secondary | ICD-10-CM | POA: Insufficient documentation

## 2022-01-15 DIAGNOSIS — M5416 Radiculopathy, lumbar region: Secondary | ICD-10-CM | POA: Insufficient documentation

## 2022-01-15 DIAGNOSIS — M5459 Other low back pain: Secondary | ICD-10-CM | POA: Diagnosis not present

## 2022-01-15 NOTE — Therapy (Signed)
OUTPATIENT PHYSICAL THERAPY EVALUATION   Patient Name: Kristin Daniels MRN: [id number] DOB:12-13-1975, 46 y.o., female Today's Date: 01/15/2022   PT End of Session - 01/15/22 1351     Visit Number 1    Number of Visits 9    Date for PT Re-Evaluation 03/12/22    Authorization Type Medicaid    PT Start Time 1014    PT Stop Time 1045    PT Time Calculation (min) 31 min    Activity Tolerance Patient tolerated treatment well    Behavior During Therapy WFL for tasks assessed/performed             Past Medical History:  Diagnosis Date   Acid reflux    Colon polyp    H. pylori infection    History of seasonal allergies    Hypercholesterolemia    Migraines    Pituitary tumor    Thyroid nodule    Vaginal Pap smear, abnormal    Past Surgical History:  Procedure Laterality Date   CESAREAN SECTION     IR RADIOLOGIST EVAL & MGMT  01/11/2022   OTHER SURGICAL HISTORY     hemangio removed at child and had something added to head to help indentation   Patient Active Problem List   Diagnosis Date Noted   Benign schwannoma 01/03/2022   Dyspareunia in female 01/03/2022   Vaginal candidiasis 05/21/2021   Oral candidiasis 05/21/2021   Bilateral leg pain 05/21/2021   Lumbar radiculopathy, acute 04/10/2021   Costochondral chest pain 12/01/2020   Pain of right lower leg 12/01/2020   Abdominal wall fluid collections 12/01/2020   Seasonal allergic rhinitis due to pollen 08/28/2020   Acne 07/26/2020   Physically well but worried 07/26/2020   H. 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 [place]) 03/14/2017   Uterine leiomyoma 03/14/2017    PCP: Erskine Emery, MD  REFERRING PROVIDER:  [name], MD  REFERRING DIAG: Lumbar radiculopathy, acute  Rationale for Evaluation and Treatment Rehabilitation  THERAPY DIAG:  Other low back pain  Muscle weakness (generalized)  ONSET DATE: Ongoing since 2021   SUBJECTIVE:       SUBJECTIVE STATEMENT: Patient reports chronic low back and right leg pain, that travels down to the calf. She reports pain can alos be on the right. This has been going on since 2021. She reports the pain can go away and then it comes back, reports it can be nothing that causes the pain. She does get more pain when standing or walking for longer periods, or doing any lifting.   PERTINENT HISTORY:  None  PAIN:  Are you having pain? Yes:  NPRS scale: 7/10 Pain location: Low back, bilateral legs (R > L) Pain description: Aching and dull in lower back, sharp and tight pain in legs Aggravating factors: Unknown Relieving factors: Using roller on legs, medication  PRECAUTIONS: None  WEIGHT BEARING RESTRICTIONS No  FALLS:  Has patient fallen in last 6 months? No  LIVING ENVIRONMENT: Lives with: lives with their family  OCCUPATION: No  PLOF: Independent  PATIENT GOALS: Pain relief   OBJECTIVE:  DIAGNOSTIC FINDINGS:  MRI Lumbar 04/23/2021: IMPRESSION: L5-S1 focal disc degeneration with superiorly migrating right paracentral extrusion impinging on  the descending right L5 nerve root.  PATIENT SURVEYS:  Modified Oswestry 42% disability   SCREENING FOR RED FLAGS: Negative  COGNITION:  Overall cognitive status: Within functional limits for tasks assessed     SENSATION: WFL  MUSCLE LENGTH: Hamstring muscle length limitation with report of radicular symptoms 90-90 length: right 30 deg, left 50 deg  POSTURE:  Rounded shoulders, increased lumbar lordosis  PALPATION: Generalized tenderness bilateral lumbar paraspinals  LUMBAR ROM:   Active  AROM  eval  Flexion 50%  Extension WFL  Right lateral flexion WFL  Left lateral  flexion WFL  Right rotation WFL  Left rotation [facility]   Patient reports lower back and bilateral posterior leg pulling  LOWER EXTREMITY ROM:      Grossly WFL  LOWER EXTREMITY MMT:    MMT Right eval Left eval  Hip flexion 4- 4  Hip extension 4- 4-  Hip abduction 4- 4-  Knee flexion 5 5  Knee extension 5 5   LUMBAR SPECIAL TESTS:  SLR test +  FUNCTIONAL TESTS:  DLLT to 45 deg indicating core/abdominal strength deficit  GAIT: Assistive device utilized: None Level of assistance: Complete Independence Comments: grossly WFL   TODAY'S TREATMENT  Prone press up x 10 Hooklying sciatic nerve floss with ankle DF/PF x 10 each Piriformis stretch 2 x 30 sec each Posterior pelvic tilt 10 x 5 sec   PATIENT EDUCATION:  Education details: Exam findings, POC, HEP Person educated: Patient Education method: Explanation, Demonstration, Tactile cues, Verbal cues, and Handouts Education comprehension: verbalized understanding, returned demonstration, verbal cues required, tactile cues required, and needs further education  HOME EXERCISE PROGRAM: Access Code: [email or web address]   ASSESSMENT: CLINICAL IMPRESSION: Patient is a 46 y.o. female who was seen today for physical therapy evaluation and treatment for chronic low back pain and radiating pain into bilateral legs. Evaluation limited due to patient arriving late. She does seem to demonstrate right lumbar radiculopathy with extension preference. She exhibits gross strength deficit of the core and hip region with positive neurodynamic limitations that are likely contributing to her ongoing symptoms.   OBJECTIVE IMPAIRMENTS decreased ROM, decreased strength, impaired flexibility, postural dysfunction, and pain.   ACTIVITY LIMITATIONS lifting, bending, standing, dressing, hygiene/grooming, and locomotion level  PARTICIPATION LIMITATIONS: meal prep, cleaning, laundry, driving, shopping, community activity, and occupation  [place],  Past/current experiences, and Time since onset of injury/illness/exacerbation are also affecting patient's functional outcome.   REHAB POTENTIAL: Good  CLINICAL DECISION MAKING: Stable/uncomplicated  EVALUATION COMPLEXITY: Low   GOALS: Goals reviewed with patient? Yes  SHORT TERM GOALS: Target date: 02/12/2022  Patient will be I with initial HEP in order to progress with therapy. Baseline: HEP provided at eval Goal status: INITIAL  2.  Patient will report pain </= 5/10 in order to reduce functional limitation with household tasks Baseline: patient reports 7/10 pain Goal status: INITIAL  3.  Patient will demonstrate right 90-90 hamstring length >/= 50 deg in order to indicate improved neurodynamics and reduce nerve tension to allow for better forward bend ability Baseline: right 90-90 hamstring length 30 deg Goal status: INITIAL  LONG TERM GOALS: Target date: 03/12/2022  Patient will be I with final HEP to maintain progress from PT. Baseline: HEP provided at eval Goal status: INITIAL  2.  Patient will report Modified ODI </= 30% in order to indicate improved functional ability. Baseline: 42% disability Goal status: INITIAL  3.  Patient will demonstrate gross hip and core strength >/= 4/5 MMT in order to  improve her standing/walking tolerance and lifting ability. Baseline:  Goal status: INITIAL  4.  Patient will demonstrate lumbar AROM grossly WFL and without pain to allow for ability to perform all self care and hygiene tasks, and household tasks including cleaning. Baseline: patient limited with lumbar flexion with increased pain Goal status: INITIAL  5.  Patient will report pain </= 3/10 in order to reduce functional limitation with household tasks and community activities Baseline: patient reports 7/10 pain Goal status: INITIAL   PLAN: PT FREQUENCY: 1x/week  PT DURATION: 8 weeks  PLANNED INTERVENTIONS: Therapeutic exercises, Therapeutic activity, Neuromuscular  re-education, Balance training, Gait training, Patient/Family education, Self Care, Joint mobilization, Joint manipulation, Aquatic Therapy, Dry Needling, Electrical stimulation, Spinal manipulation, Spinal mobilization, Cryotherapy, Moist heat, Manual therapy, and Re-evaluation.  PLAN FOR NEXT SESSION: Review HEP and progress PRN, continue with sciatic nerve glides and extension based exercises for directional preference, progression of core and hip strengthening   Hilda Blades, PT, DPT, LAT, ATC 01/16/22  10:20 AM Phone: [phone] Fax: [phone]   Check all possible CPT codes: 97164 - PT Re-evaluation, 97110- Therapeutic Exercise, [phone]- Neuro Re-education, [phone] - Gait Training, [phone] - Manual Therapy, 97530 - Therapeutic Activities, 97535 - Self Care, 97014 - Electrical stimulation (unattended), and [id number] - Aquatic therapy     If treatment provided at initial evaluation, no treatment charged due to lack of authorization.

## 2022-01-15 NOTE — Patient Instructions (Signed)
Access Code: [email or web address] URL: [email or web address] Date: 01/15/2022 Prepared by: Hilda Blades  Exercises - Prone Press Up  - 1 x daily - 3 sets - 10 reps - Supine Sciatic Nerve Glide  - 1 x daily - 3 sets - 10 reps - Supine Piriformis Stretch with Foot on Ground  - 1 x daily - 3 reps - 30 seconds hold - Supine Posterior Pelvic Tilt  - 1 x daily - 3 sets - 10 reps - 5 seconds hold

## 2022-01-18 ENCOUNTER — Other Ambulatory Visit: Payer: Self-pay | Admitting: Student

## 2022-01-18 DIAGNOSIS — D333 Benign neoplasm of cranial nerves: Secondary | ICD-10-CM

## 2022-01-21 ENCOUNTER — Ambulatory Visit: Payer: PRIVATE HEALTH INSURANCE | Admitting: Physical Therapy

## 2022-01-23 ENCOUNTER — Ambulatory Visit
Admission: RE | Admit: 2022-01-23 | Discharge: 2022-01-23 | Disposition: A | Discharge status: Home / Self Care | Payer: Medicaid Other | Source: Ambulatory Visit | Attending: Interventional Radiology | Admitting: Interventional Radiology

## 2022-01-23 ENCOUNTER — Other Ambulatory Visit (HOSPITAL_COMMUNITY)
Admission: RE | Admit: 2022-01-23 | Discharge: 2022-01-23 | Disposition: A | Discharge status: Home / Self Care | Payer: BLUE CROSS/BLUE SHIELD | Source: Ambulatory Visit | Attending: Interventional Radiology | Admitting: Interventional Radiology

## 2022-01-23 DIAGNOSIS — E041 Nontoxic single thyroid nodule: Secondary | ICD-10-CM | POA: Diagnosis present

## 2022-01-24 ENCOUNTER — Telehealth: Payer: Self-pay

## 2022-01-24 ENCOUNTER — Ambulatory Visit: Payer: PRIVATE HEALTH INSURANCE

## 2022-01-24 NOTE — Telephone Encounter (Signed)
Left message for pt regarding her 1st no show. Discussed clinic attendance policy, confirmed her next appointment, and provided the clinic phone number.

## 2022-01-25 ENCOUNTER — Other Ambulatory Visit: Payer: Self-pay | Admitting: Interventional Radiology

## 2022-01-25 ENCOUNTER — Ambulatory Visit: Payer: PRIVATE HEALTH INSURANCE

## 2022-01-25 ENCOUNTER — Other Ambulatory Visit: Payer: PRIVATE HEALTH INSURANCE

## 2022-01-25 DIAGNOSIS — M6281 Muscle weakness (generalized): Secondary | ICD-10-CM

## 2022-01-25 DIAGNOSIS — M5459 Other low back pain: Secondary | ICD-10-CM

## 2022-01-25 NOTE — Therapy (Addendum)
OUTPATIENT PHYSICAL THERAPY TREATMENT NOTE/ DISCHARGE SUMMARY   Patient Name: Kristin Daniels MRN: [id number] DOB:Feb 28, 1976, 46 y.o., female Today's Date: 01/25/2022  PCP: Erskine Emery, MD REFERRING PROVIDER: [name], MD  END OF SESSION:   PT End of Session - 01/25/22 1140     Visit Number 2    Number of Visits 9    Date for PT Re-Evaluation 03/12/22    Authorization Type Medicaid    Authorization Time Period approved 2 visits from 01/22/2022-02/11/2022    Authorization - Visit Number 1    Authorization - Number of Visits 2    PT Start Time 8182    PT Stop Time 1212    PT Time Calculation (min) 30 min    Activity Tolerance Patient tolerated treatment well    Behavior During Therapy WFL for tasks assessed/performed             Past Medical History:  Diagnosis Date   Acid reflux    Colon polyp    H. pylori infection    History of seasonal allergies    Hypercholesterolemia    Migraines    Pituitary tumor    Thyroid nodule    Vaginal Pap smear, abnormal    Past Surgical History:  Procedure Laterality Date   CESAREAN SECTION     IR RADIOLOGIST EVAL & MGMT  01/11/2022   OTHER SURGICAL HISTORY     hemangio removed at child and had something added to head to help indentation   Patient Active Problem List   Diagnosis Date Noted   Benign schwannoma 01/03/2022   Dyspareunia in female 01/03/2022   Vaginal candidiasis 05/21/2021   Oral candidiasis 05/21/2021   Bilateral leg pain 05/21/2021   Lumbar radiculopathy, acute 04/10/2021   Costochondral chest pain 12/01/2020   Pain of right lower leg 12/01/2020   Abdominal wall fluid collections 12/01/2020   Seasonal allergic rhinitis due to pollen 08/28/2020   Acne 07/26/2020   Physically well but worried 07/26/2020   H. 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 She reports adherence to her HEP.   PAIN:  Are you having pain? Yes:  NPRS scale: 7/10 Pain location: Low back, bilateral legs (R > L) Pain description: Aching and dull in lower back, sharp and tight pain in legs Aggravating factors: Unknown Relieving factors: Using roller on legs, medication   OBJECTIVE: (objective measures completed at initial evaluation unless otherwise dated)   DIAGNOSTIC FINDINGS:  MRI Lumbar 04/23/2021: IMPRESSION: L5-S1 focal disc degeneration with superiorly migrating right paracentral extrusion impinging on the descending right L5 nerve root.   PATIENT SURVEYS:  Modified Oswestry 42% disability    SCREENING FOR RED FLAGS: Negative   COGNITION:           Overall cognitive status: Within functional limits for tasks assessed  SENSATION: WFL   MUSCLE LENGTH: Hamstring muscle length limitation with report of radicular symptoms 90-90 length: right 30 deg, left 50 deg   POSTURE:  Rounded shoulders, increased lumbar lordosis   PALPATION: Generalized tenderness bilateral lumbar paraspinals   LUMBAR ROM:    Active  AROM  eval  Flexion 50%  Extension WFL  Right lateral flexion WFL  Left lateral flexion WFL  Right rotation WFL  Left rotation [facility]            Patient reports lower back and bilateral posterior leg pulling   LOWER EXTREMITY ROM:                          Grossly WFL   LOWER EXTREMITY MMT:     MMT Right eval Left eval  Hip flexion 4- 4  Hip extension 4- 4-   Hip abduction 4- 4-  Knee flexion 5 5  Knee extension 5 5    LUMBAR SPECIAL TESTS:  SLR test +   FUNCTIONAL TESTS:  DLLT to 45 deg indicating core/abdominal strength deficit   GAIT: Assistive device utilized: None Level of assistance: Complete Independence Comments: grossly WFL     TODAY'S TREATMENT   [facility] Adult PT Treatment:                                                DATE: 01/25/2022 Therapeutic Exercise: Standing abdominal press-down with red physioball at table with alternating arm lifts 3x20 Straight-arm reverse crunches with 2kg ball 3x15 Side knee plank with hip abduction 3x10 BIL Forearm plank x3 to failure 25# kettlebell deadlift 3x10 Sidelying lumbar open books x20 BIL Manual Therapy: N/A Neuromuscular re-ed: N/A Therapeutic Activity: N/A Modalities: N/A Self Care: N/A      PATIENT EDUCATION:  Education details: Exam findings, POC, HEP Person educated: Patient Education method: Explanation, Demonstration, Tactile cues, Verbal cues, and Handouts Education comprehension: verbalized understanding, returned demonstration, verbal cues required, tactile cues required, and needs further education   HOME EXERCISE PROGRAM: Access Code: [email or web address] URL: [email or web address] Date: 01/25/2022 Prepared by: Vanessa Ellsworth  Exercises - Prone Press Up  - 1 x daily - 3 sets - 10 reps - Supine Sciatic Nerve Glide  - 1 x daily - 3 sets - 10 reps - Supine Piriformis Stretch with Foot on Ground  - 1 x daily - 3 reps - 30 seconds hold - Supine Posterior Pelvic Tilt  - 1 x daily - 3 sets - 10 reps - 5 seconds hold  Added 01/25/2022: - Bilateral Bent Leg Lift  - 1 x daily - 7 x weekly - 3 sets - 20 reps - Modified Side Plank with Hip Abduction  - 1 x daily - 7 x weekly - 3 sets - 10 reps - Plank on Knees  - 1 x daily - 7 x weekly - 3 sets - 30-60 seconds hold - Deadlift With Dumbbells  - 1 x daily - 7 x weekly - 3 sets - 10 reps      ASSESSMENT: CLINICAL IMPRESSION: Pt arrived 10 minutes late to her session. The treatment was truncated due to this. It was discovered at today's visit that the pt was only approved for 2 PT treatments, including today's treatment, due to approaching her 27 visit limit for the year. Due to this, the pt has  opted to wait 3 weeks before coming to her final visit to trial new HEP exercises provided today. She reports if she needs more treatment, she will wait until the start of the new year. She responded well to progressed exercises and will continue to benefit from skilled PT to address her primary impairments and return to her prior level of function with less limitation.     OBJECTIVE IMPAIRMENTS decreased ROM, decreased strength, impaired flexibility, postural dysfunction, and pain.    ACTIVITY LIMITATIONS lifting, bending, standing, dressing, hygiene/grooming, and locomotion level   PARTICIPATION LIMITATIONS: meal prep, cleaning, laundry, driving, shopping, community activity, and occupation   [place], Past/current experiences, and Time since onset of injury/illness/exacerbation are also affecting patient's functional outcome.        GOALS: Goals reviewed with patient? Yes   SHORT TERM GOALS: Target date: 02/12/2022   Patient will be I with initial HEP in order to progress with therapy. Baseline: HEP provided at eval Goal status: INITIAL   2.  Patient will report pain </= 5/10 in order to reduce functional limitation with household tasks Baseline: patient reports 7/10 pain Goal status: INITIAL   3.  Patient will demonstrate right 90-90 hamstring length >/= 50 deg in order to indicate improved neurodynamics and reduce nerve tension to allow for better forward bend ability Baseline: right 90-90 hamstring length 30 deg Goal status: INITIAL   LONG TERM GOALS: Target date: 03/12/2022   Patient will be I with final HEP to maintain progress from PT. Baseline: HEP  provided at eval Goal status: INITIAL   2.  Patient will report Modified ODI </= 30% in order to indicate improved functional ability. Baseline: 42% disability Goal status: INITIAL   3.  Patient will demonstrate gross hip and core strength >/= 4/5 MMT in order to improve her standing/walking tolerance and lifting ability. Baseline:  Goal status: INITIAL   4.  Patient will demonstrate lumbar AROM grossly WFL and without pain to allow for ability to perform all self care and hygiene tasks, and household tasks including cleaning. Baseline: patient limited with lumbar flexion with increased pain Goal status: INITIAL   5.  Patient will report pain </= 3/10 in order to reduce functional limitation with household tasks and community activities Baseline: patient reports 7/10 pain Goal status: INITIAL     PLAN: PT FREQUENCY: 1x/week   PT DURATION: 8 weeks   PLANNED INTERVENTIONS: Therapeutic exercises, Therapeutic activity, Neuromuscular re-education, Balance training, Gait training, Patient/Family education, Self Care, Joint mobilization, Joint manipulation, Aquatic Therapy, Dry Needling, Electrical stimulation, Spinal manipulation, Spinal mobilization, Cryotherapy, Moist heat, Manual therapy, and Re-evaluation.   PLAN FOR NEXT SESSION: Review HEP and progress PRN, continue with sciatic nerve glides and extension based exercises for directional preference, progression of core and hip strengthening    Vanessa [place], PT, DPT 01/25/22 12:12 PM  PHYSICAL THERAPY DISCHARGE SUMMARY  Visits from Start of Care: 2  Current functional level related to goals / functional outcomes: Unable to assess   Remaining deficits: Unable to assess   Education / Equipment: Unable to assess   Patient agrees to discharge. Patient goals were not met. Patient is being discharged due to not returning since the last visit.  Vanessa [place], PT, DPT 05/09/22 2:41 PM

## 2022-01-28 ENCOUNTER — Other Ambulatory Visit: Payer: Self-pay | Admitting: Endocrinology

## 2022-01-28 ENCOUNTER — Ambulatory Visit: Payer: PRIVATE HEALTH INSURANCE | Admitting: Physical Therapy

## 2022-01-28 DIAGNOSIS — Z87898 Personal history of other specified conditions: Secondary | ICD-10-CM

## 2022-01-28 DIAGNOSIS — E221 Hyperprolactinemia: Secondary | ICD-10-CM

## 2022-01-28 LAB — CYTOLOGY - NON PAP

## 2022-01-30 ENCOUNTER — Other Ambulatory Visit: Payer: Self-pay | Admitting: Endocrinology

## 2022-01-30 ENCOUNTER — Other Ambulatory Visit (INDEPENDENT_AMBULATORY_CARE_PROVIDER_SITE_OTHER): Payer: BC Managed Care – PPO

## 2022-01-30 ENCOUNTER — Other Ambulatory Visit (HOSPITAL_COMMUNITY): Payer: Self-pay | Admitting: Interventional Radiology

## 2022-01-30 ENCOUNTER — Other Ambulatory Visit: Payer: Self-pay | Admitting: Student

## 2022-01-30 ENCOUNTER — Other Ambulatory Visit: Payer: Medicaid Other

## 2022-01-30 DIAGNOSIS — E041 Nontoxic single thyroid nodule: Secondary | ICD-10-CM

## 2022-01-30 DIAGNOSIS — Z1231 Encounter for screening mammogram for malignant neoplasm of breast: Secondary | ICD-10-CM

## 2022-01-30 DIAGNOSIS — D352 Benign neoplasm of pituitary gland: Secondary | ICD-10-CM

## 2022-01-30 LAB — T4, FREE: Free T4: 0.69 ng/dL (ref 0.60–1.60)

## 2022-01-30 LAB — TSH: TSH: 0.77 u[IU]/mL (ref 0.35–5.50)

## 2022-02-01 ENCOUNTER — Other Ambulatory Visit: Payer: PRIVATE HEALTH INSURANCE

## 2022-02-01 LAB — PROLACTIN: Prolactin: 13.6 ng/mL (ref 4.8–23.3)

## 2022-02-04 ENCOUNTER — Encounter: Payer: Medicaid Other | Admitting: Physical Therapy

## 2022-02-05 ENCOUNTER — Encounter: Payer: Self-pay | Admitting: Endocrinology

## 2022-02-05 ENCOUNTER — Ambulatory Visit (INDEPENDENT_AMBULATORY_CARE_PROVIDER_SITE_OTHER): Payer: PRIVATE HEALTH INSURANCE | Admitting: Endocrinology

## 2022-02-05 VITALS — BP 120/82 | HR 79 | Ht 62.0 in | Wt 169.0 lb

## 2022-02-05 DIAGNOSIS — E042 Nontoxic multinodular goiter: Secondary | ICD-10-CM | POA: Diagnosis not present

## 2022-02-05 DIAGNOSIS — D352 Benign neoplasm of pituitary gland: Secondary | ICD-10-CM | POA: Diagnosis not present

## 2022-02-05 NOTE — Progress Notes (Signed)
Patient ID: Kristin Daniels, female   DOB: 02/08/1976, 46 y.o.   MRN: [id number]            Chief complaint: Endocrinology follow-up  History of Present Illness    PROBLEM 1:  Prolactinoma In 2009 she had presented to her physician with complaints of milky discharge from her breasts and headaches She had not had any late or missed menstrual cycles at that time She reportedly was diagnosed to have a prolactinoma and started on cabergoline She does not know what her baseline prolactin level was  She had been followed by an endocrinologist in [place] state with periodic lab work and MRIs and continued on cabergoline Only a few reports are available from previous records and not clear if her prolactin has been consistently controlled In 12/16 her prolactin was 45 About 6 months prior to her visit in December 2018 with her PCP she was told to start taking 1/2 tablet only once a week with her Dostinex  RECENT history: In late 2018 started having some headaches and also breast fullness and mild galactorrhea When her prolactin level was 29 she was told to increase her dosage to half tablet twice a week With relatively higher prolactin levels she is usually has symptoms of her breasts swelling, feeling full and tender.  Also would have a milky discharge on expression but no change in menstrual cycles.  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 Prolactin is now 17 but still normal  Prolactin levels:  Lab Results  Component  Value Date   PROLACTIN 13.6 01/30/2022   PROLACTIN 17.5 07/23/2021   PROLACTIN 9.1 02/22/2021   PROLACTIN 22.7 10/23/2020   MRI of pituitary gland was done by neurologist in the 05/2019 which showed an 8 mm hyperintense mass in the posterior sella.  No comparison was made with prior MRI as below  MRI of pituitary gland as of 03/15/2016 shows a 7 mm posterior central left-sided lobulated pituitary adenoma extending to the insertion of the infundibulum which is slightly deviated towards the right  Past history:  During her pregnancy when she was not on regular medication she developed severe diabetes insipidus  which was controlled with her restarting treatment and she took initially bromocriptine and subsequently cabergoline during her pregnancy   PROBLEM 2:  Multinodular goiter.  She had this diagnosed on routine exam several years ago  No prior ultrasound reports are available when [place] was [place] out [place] and no description of her exam is available from previous notes.  However baseline size of the nodule on the left was 2.8 cm in 2013 Records have shown that she had needle aspiration biopsies of the left dominant nodule 3 times, see below.  Biopsies have been benign consistently including Afirma testing  Thyroid nodule measured 4.4 cm in 04/2020 compared to 3.7 cm as of 2018  No symptoms of local pressure sensation, difficulty swallowing or choking She again feels that her left thyroid nodule may be growing and she may be having more  than 1 nodule  Previously in 11/21 her left-sided nodule was about 3-3.5 cm on exam  Her thyroid levels have been variable with generally low normal TSH levels without increase in free T4 or T3 levels TSH is consistently normal  Free T4 low normal  Lab Results  Component Value Date   TSH 0.77 01/30/2022   TSH 0.65 07/23/2021   TSH 0.79 02/22/2021   FREET4 0.69 01/30/2022   FREET4 0.61 07/23/2021   FREET4 0.68 02/22/2021   Lab  Results  Component Value Date   T3FREE 2.5 10/23/2020   T3FREE 3.0 03/13/2020   T3FREE 3.6 09/08/2018     Previous studies:   Her first biopsy was done in 09/2011 which had shown indeterminate cytology and this was confirmed to be benign on Affirma testing At that time her nodule was 2.8 cm  Needle aspiration done in 01/2015 indicated she had a 3 cm nodule that previously had been benign; this showed scanty cellular specimen along with colloid and degenerated macrophages In 08/2016 the needle aspiration biopsy showed a benign follicular adenoma and the nodule size was indicated at 3.7 cm  Lab Results  Component Value Date   TSH 0.77 01/30/2022   TSH 0.65 07/23/2021   TSH 0.79 02/22/2021   FREET4 0.69 01/30/2022   FREET4 0.61 07/23/2021   FREET4 0.68 02/22/2021     Allergies as of 02/05/2022       Reactions   Metronidazole Anaphylaxis, Hives   Pollen Extract    Diflucan [fluconazole] Rash   Broke out in a rash with hives around her mouth, hands, face.        Medication List        Accurate as of February 05, 2022 10:32 AM. If you have any questions, ask your nurse or doctor.          ALPRAZolam 1 MG tablet Commonly known as: XANAX Take 1 mg by mouth daily.   ARIPiprazole 30 MG tablet Commonly known as: ABILIFY Take 30 mg by mouth daily.   cabergoline 0.5 MG tablet Commonly known as: DOSTINEX TAKE 1 TAB ON SUNDAYS AND HALF TABLET ON THURSDAYS   cetirizine 10 MG tablet Commonly known as: ZYRTEC Take 1 tablet (10 mg total) by mouth daily.   clindamycin 1 % external solution Commonly known as: Cleocin-T Apply topically 2 (two) times daily as needed.   cyclobenzaprine 5 MG tablet Commonly known as: FLEXERIL Take 1 tablet (5 mg total) by mouth 3 (three) times daily as needed for muscle spasms.   desonide 0.05 % cream Commonly known as: DESOWEN Apply topically 2 (two) times daily.   FIBER PO Take by mouth.   FISH OIL PO Take by mouth.   fluticasone  50 MCG/ACT nasal spray Commonly known as: FLONASE Place 2 sprays into both nostrils daily.   gabapentin 100 MG capsule Commonly known as: Neurontin Take 2 capsules (200 mg total) by mouth 3 (three) times daily.   hydrocortisone 2.5 % cream Apply topically 2 (two) times daily.   Ibrexafungerp Citrate 150 MG Tabs Take 2 tablets by mouth 2 (two) times daily.   MAGNESIUM PO Take by mouth.   Miconazole Nitrate Applicator 342 & 2 MG-% (9GM) Kit Place 100 mg vaginally at bedtime.   multivitamin tablet Take 1 tablet by mouth daily.   naproxen sodium 550 MG tablet Commonly known as: Anaprox DS Take 1 tablet (550 mg total) by mouth 2 (two) times daily as needed.   nortriptyline 50 MG capsule Commonly  known as: PAMELOR TAKE 2 CAPSULES (100 MG TOTAL) BY MOUTH AT BEDTIME.   nystatin 100000 UNIT/ML suspension Commonly known as: MYCOSTATIN Take 5 mLs (500,000 Units total) by mouth 4 (four) times daily.   Olopatadine HCl 0.2 % Soln Apply 1 drop to eye daily.   omeprazole 40 MG capsule Commonly known as: PRILOSEC Take 1 capsule (40 mg total) by mouth daily.   Riboflavin-Magnesium-Feverfew 100-90-25 MG Tabs Take 100 mg by mouth daily.   rizatriptan 10 MG tablet Commonly known as: Maxalt Take 1 tablet (10 mg total) by mouth as needed for migraine. May repeat after 2 hours if needed.  Maximum 2 tablets in 24 hours.   terconazole 0.4 % vaginal cream Commonly known as: TERAZOL 7 Place 1 applicator vaginally at bedtime.   topiramate 25 MG tablet Commonly known as: TOPAMAX Take 1 tablet (25 mg total) by mouth at bedtime.   Trulance 3 MG Tabs Generic drug: Plecanatide Take 1 tablet by mouth daily.   ZINC SULFATE-VITAMIN C MT Use as directed in the mouth or throat.   zolpidem 10 MG tablet Commonly known as: AMBIEN Take 10 mg by mouth at bedtime as needed.        Allergies:  Allergies  Allergen Reactions  . Metronidazole Anaphylaxis and Hives  . Pollen Extract   .  Diflucan [Fluconazole] Rash    Broke out in a rash with hives around her mouth, hands, face.    Past Medical History:  Diagnosis Date  . Acid reflux   . Colon polyp   . H. pylori infection   . History of seasonal allergies   . Hypercholesterolemia   . Migraines   . Pituitary tumor   . Thyroid nodule   . Vaginal Pap smear, abnormal     Past Surgical History:  Procedure Laterality Date  . CESAREAN SECTION    . IR RADIOLOGIST EVAL & MGMT  01/11/2022  . OTHER SURGICAL HISTORY     hemangio removed at child and had something added to head to help indentation    Family History  Problem Relation Age of Onset  . Hypertension Mother   . Hypercholesterolemia Mother   . Colon polyps Maternal Grandmother   . Colon cancer Neg Hx   . Esophageal cancer Neg Hx   . Stomach cancer Neg Hx   . Rectal cancer Neg Hx     Social History:  reports that she has never smoked. She has never used smokeless tobacco. She reports current alcohol use. She reports that she does not use drugs.   Review of Systems  EXAM:  BP 120/82   Pulse 79   Ht $R'5\' 2"'FT$  (1.575 m)   Wt 169 lb (76.7 kg)   SpO2 98%   BMI 30.91 kg/m   Physical Exam   Left thyroid nodule is about 3 cm in size and relatively smooth, firm No other nodules felt  Right lobe not palpable  No lymphadenopathy in the neck  ASSESSMENT:     Prolactinoma since 2009 with history of sellar mass that was heterogenous and 7 mm in original size and presenting with headaches and galactorrhea.   She has been cabergoline long-term with the current dose of 0.5 mg alternating with half tablet in each week  With taking her cabergoline regularly her prolactin is still quite normal around 17 She has had mild mastodynia  Although she thinks her prolactin has gone high reassured her that the level is still within the normal range and should not  be causing any symptoms  Last MRI was in 1/21 showing a small adenoma She is due to get another MRI from  her neurologist because of headaches  Multinodular goiter, with a longstanding benign left-sided nodule, diagnosed as benign follicular adenoma on her last biopsy  She also has had a slow growth of her thyroid nodule that was initially diagnosed in 2013  Her thyroid nodule on the left side feels about the same on exam as on the last visit, lower margin of the nodule not clearly defined    PLAN:     Continue same dose of cabergoline, alternating 1 tablet with half tablet, twice in each week  Reassured the patient that the the longstanding nodule she has is benign from previous biopsy However because of her concern about the size of the nodule will recheck her thyroid ultrasound If there is no significant change we will continue to monitor periodically clinically  Follow-up in 6 months    Hezekiah Veltre Dwyane Dee 02/05/22

## 2022-02-08 ENCOUNTER — Ambulatory Visit: Payer: BC Managed Care – PPO | Admitting: Physical Therapy

## 2022-02-11 ENCOUNTER — Ambulatory Visit: Payer: BC Managed Care – PPO | Admitting: Physical Therapy

## 2022-02-19 ENCOUNTER — Other Ambulatory Visit: Payer: Self-pay

## 2022-02-19 ENCOUNTER — Encounter: Payer: Self-pay | Admitting: Family Medicine

## 2022-02-19 ENCOUNTER — Other Ambulatory Visit (HOSPITAL_COMMUNITY)
Admission: RE | Admit: 2022-02-19 | Discharge: 2022-02-19 | Disposition: A | Discharge status: Home / Self Care | Payer: PRIVATE HEALTH INSURANCE | Source: Ambulatory Visit | Attending: Family Medicine | Admitting: Family Medicine

## 2022-02-19 ENCOUNTER — Ambulatory Visit (INDEPENDENT_AMBULATORY_CARE_PROVIDER_SITE_OTHER): Payer: PRIVATE HEALTH INSURANCE | Admitting: Family Medicine

## 2022-02-19 VITALS — BP 127/85 | HR 83 | Wt 166.8 lb

## 2022-02-19 DIAGNOSIS — R300 Dysuria: Secondary | ICD-10-CM

## 2022-02-19 DIAGNOSIS — N949 Unspecified condition associated with female genital organs and menstrual cycle: Secondary | ICD-10-CM | POA: Insufficient documentation

## 2022-02-19 DIAGNOSIS — B3731 Acute candidiasis of vulva and vagina: Secondary | ICD-10-CM | POA: Diagnosis not present

## 2022-02-19 DIAGNOSIS — N941 Unspecified dyspareunia: Secondary | ICD-10-CM | POA: Diagnosis not present

## 2022-02-19 LAB — POCT URINALYSIS DIP (DEVICE)
Bilirubin Urine: NEGATIVE
Glucose, UA: NEGATIVE mg/dL
Ketones, ur: NEGATIVE mg/dL
Leukocytes,Ua: NEGATIVE
Nitrite: NEGATIVE
Protein, ur: NEGATIVE mg/dL
Specific Gravity, Urine: 1.02 (ref 1.005–1.030)
Urobilinogen, UA: 0.2 mg/dL (ref 0.0–1.0)
pH: 6 (ref 5.0–8.0)

## 2022-02-19 MED ORDER — MICONAZOLE NITRATE 4 % VA CREA: 1.0000 | TOPICAL_CREAM | Freq: Every evening | VAGINAL | 1 refills | Status: AC

## 2022-02-19 MED ORDER — NITROFURANTOIN MONOHYD MACRO 100 MG PO CAPS: 100.0000 mg | ORAL_CAPSULE | Freq: Two times a day (BID) | ORAL | 0 refills | Status: DC

## 2022-02-19 NOTE — Progress Notes (Unsigned)
GYNECOLOGY OFFICE VISIT NOTE  History:   Kristin Daniels is a 46 y.o. [phone] here today for multiple issues.  Worried she might have a urine infection Had some diarrhea and worried it has affected her Some burning and pain with urination Feels increased frequency and suprapubic pain  Also worried about a vaginal infection Had some white clumpy discharge Did use an OTC yeast treatment Does endorse some itching No odor Reports she is allergic to diflucan, gets a rash Has used topical creams without issue when taking a benadryll beforehand  Reports lots of vaginal dryness, especially with intercourse Has regular period, no symptoms of menopause    Health Maintenance Due  Topic Date Due   COVID-19 Vaccine (2 - Booster for [employer] series) 10/09/2019   INFLUENZA VACCINE  Never done    Past Medical History:  Diagnosis Date   Acid reflux    Colon polyp    H. pylori infection    History of seasonal allergies    Hypercholesterolemia    Migraines    Pituitary tumor    Thyroid nodule    Vaginal Pap smear, abnormal     Past Surgical History:  Procedure Laterality Date   CESAREAN SECTION     IR RADIOLOGIST EVAL & MGMT  01/11/2022   OTHER SURGICAL HISTORY     hemangio removed at child and had something added to head to help indentation    The following portions of the patient's history were reviewed and updated as appropriate: allergies, current medications, past family history, past medical history, past social history, past surgical history and problem list.   Health Maintenance:   Last pap: Lab Results  Component Value Date   DIAGPAP  12/23/2019    - Negative for intraepithelial lesion or malignancy (NILM)   HPV NOT DETECTED 03/14/2017   [place] Negative 12/23/2019    Last mammogram:  02/21/21 - BIRADS 1, scheduled for repeat next month    Review of Systems:  Pertinent items noted in HPI and remainder of comprehensive ROS otherwise negative.  Physical Exam:   BP 127/85   Pulse 83   Wt 166 lb 12.8 oz (75.7 kg)   BMI 30.51 kg/m  CONSTITUTIONAL: Well-developed, well-nourished female in no acute distress.  HEENT:  Normocephalic, atraumatic. External right and left ear normal. No scleral icterus.  NECK: Normal range of motion, supple, no masses noted on observation SKIN: No rash noted. Not diaphoretic. No erythema. No pallor. MUSCULOSKELETAL: Normal range of motion. No edema noted. NEUROLOGIC: Alert and oriented to person, place, and time. Normal muscle tone coordination.  PSYCHIATRIC: Normal mood and affect. Normal behavior. Normal judgment and thought content. RESPIRATORY: Effort normal, no problems with respiration noted ABDOMEN: No masses noted. No other overt distention noted.  *** PELVIC: {Blank single:19197::"Deferred","Normal appearing external genitalia; normal appearing vaginal mucosa and cervix.  No abnormal discharge noted.  Normal uterine size, no other palpable masses, no uterine or adnexal tenderness."}  Labs and Imaging No results found for this or any previous visit (from the past 168 hour(s)). [place] FNA BX THYROID 1ST LESION AFIRMA  Result Date: 01/23/2022 INDICATION: 46 year old female with symptomatic left thyroid nodule. Presents for fine-needle aspiration prior to thyroid nodule radiofrequency ablation. EXAM: ULTRASOUND GUIDED FINE NEEDLE ASPIRATION OF INDETERMINATE THYROID NODULE COMPARISON:  None Available. MEDICATIONS: None COMPLICATIONS: None immediate. TECHNIQUE: Informed written consent was obtained from the patient after a discussion of the risks, benefits and alternatives to treatment. Questions regarding the procedure were encouraged and answered. A timeout  was performed prior to the initiation of the procedure. Pre-procedural ultrasound scanning demonstrated unchanged size and appearance of the indeterminate nodule within the left hemi thyroid. The procedure was planned. The neck was prepped in the usual sterile fashion, and  a sterile drape was applied covering the operative field. A timeout was performed prior to the initiation of the procedure. Local anesthesia was provided with 1% lidocaine. Under direct ultrasound guidance, 5 FNA biopsies were performed of the left thyroid nodule with a 25 gauge needle. Multiple ultrasound images were saved for procedural documentation purposes. The samples were prepared and submitted to pathology. Limited post procedural scanning was negative for hematoma or additional complication. Dressings were placed. The patient tolerated the above procedures procedure well without immediate postprocedural complication. FINDINGS: Nodule reference number based on prior diagnostic ultrasound: 1 Maximum size: 4.4 cm Location: Left; Mid ACR TI-RADS risk category: TR4 (4-6 points) Reason for biopsy: meets other recommendations Ultrasound imaging confirms appropriate placement of the needles within the thyroid nodule. IMPRESSION: Technically successful ultrasound guided fine needle aspiration of 4.4 cm left thyroid nodule. Ruthann Cancer, MD [facility] Radiology Electronically Signed   By: Ruthann Cancer M.D.   On: 01/23/2022 12:35      Assessment and Plan:   Problem List Items Addressed This Visit   None Visit Diagnoses     Dysuria    -  Primary   Vaginal symptom           Routine preventative health maintenance measures emphasized. Please refer to After Visit Summary for other counseling recommendations.   No follow-ups on file.    Total face-to-face time with patient: {Blank single:19197::"10","15","20","25","30"} minutes.  Over 50% of encounter was spent on counseling and coordination of care.   Clarnce Flock, MD/MPH Attending Family Medicine Physician, [facility] for [facility], [place]

## 2022-02-19 NOTE — Progress Notes (Unsigned)
Patient in today for concerns with possible yeast infection. States she used OTC treatment and it resolved but feels like she has another infection. Also concerned that she possible has BV and/or UTI.  Patient also expresses concerns for dryness and painful intercourse. Would like to know if there is any cream that she could be prescribed to help with this.  Patient declined STD screening.  Pap smear is up to date. Mammogram scheduled for 04/02/22    Altha Harm, CMA

## 2022-02-20 LAB — CERVICOVAGINAL ANCILLARY ONLY
Bacterial Vaginitis (gardnerella): NEGATIVE
Candida Glabrata: NEGATIVE
Candida Vaginitis: NEGATIVE
Comment: NEGATIVE
Comment: NEGATIVE
Comment: NEGATIVE

## 2022-02-20 NOTE — Assessment & Plan Note (Addendum)
Suspected yeast vaginitis, empiric rx sent, self swab obtained. Suspect urinary symptoms are related to this but empiric UTI treatment sent as well and culture was sent.

## 2022-02-20 NOTE — Assessment & Plan Note (Addendum)
Patient inquired about vaginal progesterone. Given she has regular periods and no menopausal symptoms we discussed that I did not think a lack of estrogen was the primary issue. Recommended conservative approach, more foreplay with partner, use lubrication liberally.  She is also interested in resuming pelvic PT, new order placed.

## 2022-02-21 ENCOUNTER — Ambulatory Visit (HOSPITAL_COMMUNITY): Payer: Medicaid Other

## 2022-02-22 ENCOUNTER — Ambulatory Visit: Payer: Medicaid Other

## 2022-02-22 LAB — URINE CULTURE

## 2022-02-26 ENCOUNTER — Encounter: Payer: Self-pay | Admitting: Family Medicine

## 2022-03-04 ENCOUNTER — Ambulatory Visit: Payer: Medicaid Other | Admitting: Neurology

## 2022-03-06 ENCOUNTER — Other Ambulatory Visit (HOSPITAL_COMMUNITY): Payer: Medicaid Other

## 2022-03-11 ENCOUNTER — Ambulatory Visit: Payer: Medicaid Other | Admitting: Student

## 2022-03-13 ENCOUNTER — Ambulatory Visit: Payer: Medicaid Other | Admitting: Student

## 2022-03-14 ENCOUNTER — Encounter: Payer: Self-pay | Admitting: Student

## 2022-03-14 ENCOUNTER — Ambulatory Visit (INDEPENDENT_AMBULATORY_CARE_PROVIDER_SITE_OTHER): Payer: PRIVATE HEALTH INSURANCE | Admitting: Student

## 2022-03-14 VITALS — BP 102/68 | HR 102 | Ht 62.0 in | Wt 167.0 lb

## 2022-03-14 DIAGNOSIS — Z131 Encounter for screening for diabetes mellitus: Secondary | ICD-10-CM

## 2022-03-14 DIAGNOSIS — R5383 Other fatigue: Secondary | ICD-10-CM

## 2022-03-14 DIAGNOSIS — M79605 Pain in left leg: Secondary | ICD-10-CM

## 2022-03-14 DIAGNOSIS — M5416 Radiculopathy, lumbar region: Secondary | ICD-10-CM

## 2022-03-14 DIAGNOSIS — R300 Dysuria: Secondary | ICD-10-CM | POA: Diagnosis not present

## 2022-03-14 DIAGNOSIS — Z1159 Encounter for screening for other viral diseases: Secondary | ICD-10-CM | POA: Diagnosis not present

## 2022-03-14 DIAGNOSIS — M79604 Pain in right leg: Secondary | ICD-10-CM | POA: Diagnosis not present

## 2022-03-14 LAB — POCT URINALYSIS DIP (MANUAL ENTRY)
Bilirubin, UA: NEGATIVE
Blood, UA: NEGATIVE
Glucose, UA: NEGATIVE mg/dL
Ketones, POC UA: NEGATIVE mg/dL
Leukocytes, UA: NEGATIVE
Nitrite, UA: NEGATIVE
Protein Ur, POC: NEGATIVE mg/dL
Spec Grav, UA: 1.015 (ref 1.010–1.025)
Urobilinogen, UA: 0.2 E.U./dL
pH, UA: 6 (ref 5.0–8.0)

## 2022-03-14 LAB — POCT GLYCOSYLATED HEMOGLOBIN (HGB A1C): Hemoglobin A1C: 5.5 % (ref 4.0–5.6)

## 2022-03-14 NOTE — Progress Notes (Signed)
SUBJECTIVE:   CHIEF COMPLAINT / HPI:   Fatigue/Cramping: She reports that she has had ongoing fatigue and cramping in her lower extremities that has been present for about 1 year.  She is concerned about her estrogen levels and that she may be premenopausal.  The patient has irregular menstrual cycle and last menstrual was October 2023.  Patient denies shortness of breath or chest pain.  Patient denies any urinary symptoms but would like to be tested for diabetes.  She is also questioning if she should be tested for hepatitis B.  She also reports that she was treated for staph epidermis with Macrobid.   PERTINENT  PMH / PSH:   Past Medical History:  Diagnosis Date   Acid reflux    Colon polyp    H. pylori infection    History of seasonal allergies    Hypercholesterolemia    Migraines    Pituitary tumor    Thyroid nodule    Vaginal Pap smear, abnormal     OBJECTIVE:  BP 102/68   Pulse (!) 102   Ht '5\' 2"'$  (1.575 m)   Wt 167 lb (75.8 kg)   LMP 02/25/2022   PF 98 L/min   BMI 30.54 kg/m  Physical Exam Vitals reviewed.  Constitutional:      Appearance: Normal appearance.  HENT:     Right Ear: External ear normal.     Left Ear: External ear normal.     Nose: Nose normal.     Mouth/Throat:     Mouth: Mucous membranes are moist.  Cardiovascular:     Rate and Rhythm: Normal rate and regular rhythm.  Pulmonary:     Effort: Pulmonary effort is normal.     Breath sounds: Normal breath sounds.  Abdominal:     General: Abdomen is flat. There is no distension.     Palpations: Abdomen is soft. There is no mass.  Musculoskeletal:        General: No swelling or tenderness. Normal range of motion.     Cervical back: Normal range of motion.     Right lower leg: No edema.     Left lower leg: No edema.  Skin:    General: Skin is warm.     Capillary Refill: Capillary refill takes less than 2 seconds.  Neurological:     Mental Status: She is alert.      ASSESSMENT/PLAN:   Dysuria Assessment & Plan: History of staph epidermis; completed treatment with Macrobid. UA ordered for evaluation.   Orders: -     POCT urinalysis dipstick  Other fatigue Assessment & Plan: TSH recently normal. No evidence of depressed mood. Vague fatigue, no systemic or infectious symptoms. A1C ordered today. Will check electrolytes/Mag and CBC for anemia. Suspect psychosomatic vs worried well. Will continue monitoring   Orders: -     POCT glycosylated hemoglobin (Hb A1C) -     Basic metabolic panel -     CBC -     Magnesium -     Estradiol  Need for hepatitis B screening test Assessment & Plan: Screening for hep B + Hep b immunity. Patient is immune and does not need further testing   Orders: -     Hepatitis B surface antigen -     Hepatitis B surface antibody,quantitative -     Hepatitis B core antibody, total  Lumbar radiculopathy, acute Assessment & Plan: Lower back pain, patient requests updated PT referral. Rx ordered   Orders: -  Ambulatory referral to Physical Therapy  Screening for diabetes mellitus Assessment & Plan: A1C ordered at patient request. Appropriate request given age and BMI  Orders: -     POCT glycosylated hemoglobin (Hb A1C)  Bilateral leg pain Assessment & Plan: BLE cramping, previous ABIs unremarkable. BMP/mg pending for lyte check and will assess CBC as well. Chronic concern, no evidence of DVT on exam. Will reassess, instructed to maintain hydration and drink a Gatorade intermittently.     Return in about 4 weeks (around 04/11/2022) for pain follow up. Erskine Emery, MD 03/15/2022, 1:22 PM PGY-2, [place]

## 2022-03-14 NOTE — Patient Instructions (Signed)
It was great to see you today! Thank you for choosing [facility] for your primary care. Kristin Daniels was seen for follow up.  Please keep a diary of your symptoms  I will check some labs today and update you with the results Return if symptoms persist   If you haven't already, sign up for My Chart to have easy access to your labs results, and communication with your primary care physician.  We are checking some labs today. If they are abnormal, I will call you. If they are normal, I will send you a MyChart message (if it is active) or a letter in the mail. If you do not hear about your labs in the next 2 weeks, please call the office. I recommend that you always bring your medications to each appointment as this makes it easy to ensure you are on the correct medications and helps [place] not miss refills when you need them. Call the clinic at [phone] if your symptoms worsen or you have any concerns.  You should return to our clinic Return in about 4 weeks (around 04/11/2022) for pain follow up. Please arrive 15 minutes before your appointment to ensure smooth check in process.  We appreciate your efforts in making this happen.  Thank you for allowing me to participate in your care, Erskine Emery, MD 03/14/2022, 10:43 AM PGY-2, [place]

## 2022-03-15 DIAGNOSIS — Z131 Encounter for screening for diabetes mellitus: Secondary | ICD-10-CM | POA: Insufficient documentation

## 2022-03-15 DIAGNOSIS — R300 Dysuria: Secondary | ICD-10-CM | POA: Insufficient documentation

## 2022-03-15 DIAGNOSIS — R5383 Other fatigue: Secondary | ICD-10-CM | POA: Insufficient documentation

## 2022-03-15 DIAGNOSIS — Z1159 Encounter for screening for other viral diseases: Secondary | ICD-10-CM | POA: Insufficient documentation

## 2022-03-15 LAB — CBC
Hematocrit: 37.4 % (ref 34.0–46.6)
Hemoglobin: 12.2 g/dL (ref 11.1–15.9)
MCH: 27.5 pg (ref 26.6–33.0)
MCHC: 32.6 g/dL (ref 31.5–35.7)
MCV: 84 fL (ref 79–97)
Platelets: 285 10*3/uL (ref 150–450)
RBC: 4.43 x10E6/uL (ref 3.77–5.28)
RDW: 13.7 % (ref 11.7–15.4)
WBC: 5.3 10*3/uL (ref 3.4–10.8)

## 2022-03-15 LAB — BASIC METABOLIC PANEL
BUN/Creatinine Ratio: 16 (ref 9–23)
BUN: 10 mg/dL (ref 6–24)
CO2: 24 mmol/L (ref 20–29)
Calcium: 9.7 mg/dL (ref 8.7–10.2)
Chloride: 104 mmol/L (ref 96–106)
Creatinine, Ser: 0.61 mg/dL (ref 0.57–1.00)
Glucose: 85 mg/dL (ref 70–99)
Potassium: 4.3 mmol/L (ref 3.5–5.2)
Sodium: 139 mmol/L (ref 134–144)
eGFR: 112 mL/min/{1.73_m2} (ref >59)

## 2022-03-15 LAB — ESTRADIOL: Estradiol: 55.1 pg/mL

## 2022-03-15 LAB — MAGNESIUM: Magnesium: 2 mg/dL (ref 1.6–2.3)

## 2022-03-15 LAB — HEPATITIS B SURFACE ANTIGEN: Hepatitis B Surface Ag: NEGATIVE

## 2022-03-15 LAB — HEPATITIS B CORE ANTIBODY, TOTAL: Hep B Core Total Ab: NEGATIVE

## 2022-03-15 LAB — HEPATITIS B SURFACE ANTIBODY, QUANTITATIVE: Hepatitis B Surf Ab Quant: 428.7 m[IU]/mL (ref >9.9)

## 2022-03-15 NOTE — Assessment & Plan Note (Signed)
Lower back pain, patient requests updated PT referral. Rx ordered

## 2022-03-15 NOTE — Assessment & Plan Note (Signed)
Screening for hep B + Hep b immunity. Patient is immune and does not need further testing

## 2022-03-15 NOTE — Assessment & Plan Note (Signed)
History of staph epidermis; completed treatment with Macrobid. UA ordered for evaluation.

## 2022-03-15 NOTE — Assessment & Plan Note (Signed)
TSH recently normal. No evidence of depressed mood. Vague fatigue, no systemic or infectious symptoms. A1C ordered today. Will check electrolytes/Mag and CBC for anemia. Suspect psychosomatic vs worried well. Will continue monitoring

## 2022-03-15 NOTE — Assessment & Plan Note (Addendum)
A1C ordered at patient request. Appropriate request given age and BMI

## 2022-03-15 NOTE — Assessment & Plan Note (Signed)
BLE cramping, previous ABIs unremarkable. BMP/mg pending for lyte check and will assess CBC as well. Chronic concern, no evidence of DVT on exam. Will reassess, instructed to maintain hydration and drink a Gatorade intermittently.

## 2022-03-16 ENCOUNTER — Encounter: Payer: Self-pay | Admitting: Student

## 2022-03-19 MED ORDER — OMEPRAZOLE 40 MG PO CPDR: 40.0000 mg | DELAYED_RELEASE_CAPSULE | Freq: Every day | ORAL | 0 refills | Status: DC

## 2022-04-02 ENCOUNTER — Ambulatory Visit
Admission: RE | Admit: 2022-04-02 | Discharge: 2022-04-02 | Disposition: A | Discharge status: Home / Self Care | Payer: Medicaid Other | Source: Ambulatory Visit | Attending: Family Medicine | Admitting: Family Medicine

## 2022-04-02 DIAGNOSIS — Z1231 Encounter for screening mammogram for malignant neoplasm of breast: Secondary | ICD-10-CM

## 2022-04-08 ENCOUNTER — Ambulatory Visit (HOSPITAL_COMMUNITY)
Admission: RE | Admit: 2022-04-08 | Discharge: 2022-04-08 | Disposition: A | Discharge status: Home / Self Care | Payer: BC Managed Care – PPO | Source: Ambulatory Visit | Attending: Interventional Radiology | Admitting: Interventional Radiology

## 2022-04-08 DIAGNOSIS — E041 Nontoxic single thyroid nodule: Secondary | ICD-10-CM | POA: Diagnosis present

## 2022-04-08 MED ORDER — LIDOCAINE HCL 1 % IJ SOLN
INTRAMUSCULAR | Status: AC
  Administered 2022-04-08: 20 mL
  Filled 2022-04-08: qty 20

## 2022-04-17 ENCOUNTER — Other Ambulatory Visit: Payer: Self-pay | Admitting: *Deleted

## 2022-04-17 ENCOUNTER — Other Ambulatory Visit: Payer: Self-pay | Admitting: Interventional Radiology

## 2022-04-17 DIAGNOSIS — E041 Nontoxic single thyroid nodule: Secondary | ICD-10-CM

## 2022-04-18 ENCOUNTER — Encounter: Payer: Self-pay | Admitting: *Deleted

## 2022-04-18 ENCOUNTER — Other Ambulatory Visit: Payer: Self-pay | Admitting: Endocrinology

## 2022-04-18 DIAGNOSIS — Z87898 Personal history of other specified conditions: Secondary | ICD-10-CM

## 2022-04-18 DIAGNOSIS — E221 Hyperprolactinemia: Secondary | ICD-10-CM

## 2022-05-02 ENCOUNTER — Other Ambulatory Visit: Payer: Self-pay | Admitting: Endocrinology

## 2022-05-02 DIAGNOSIS — Z87898 Personal history of other specified conditions: Secondary | ICD-10-CM

## 2022-05-02 DIAGNOSIS — E221 Hyperprolactinemia: Secondary | ICD-10-CM

## 2022-05-13 ENCOUNTER — Ambulatory Visit: Payer: Medicaid Other

## 2022-05-17 ENCOUNTER — Ambulatory Visit
Admission: RE | Admit: 2022-05-17 | Discharge: 2022-05-17 | Disposition: A | Discharge status: Home / Self Care | Payer: PRIVATE HEALTH INSURANCE | Source: Ambulatory Visit | Attending: Interventional Radiology | Admitting: Interventional Radiology

## 2022-05-17 DIAGNOSIS — E041 Nontoxic single thyroid nodule: Secondary | ICD-10-CM

## 2022-05-22 LAB — T3: T3, Total: 111 ng/dL (ref 76–181)

## 2022-05-22 LAB — TSH: TSH: 1.34 mIU/L

## 2022-05-22 LAB — T4, FREE: Free T4: 0.9 ng/dL (ref 0.8–1.8)

## 2022-05-23 ENCOUNTER — Ambulatory Visit
Admission: RE | Admit: 2022-05-23 | Discharge: 2022-05-23 | Disposition: A | Discharge status: Home / Self Care | Payer: PRIVATE HEALTH INSURANCE | Source: Ambulatory Visit | Attending: Interventional Radiology | Admitting: Interventional Radiology

## 2022-05-23 DIAGNOSIS — E041 Nontoxic single thyroid nodule: Secondary | ICD-10-CM

## 2022-05-23 HISTORY — PX: IR RADIOLOGIST EVAL & MGMT: IMG5224

## 2022-05-23 NOTE — Progress Notes (Signed)
Reason for follow up: Patient was seen in virtual telephone consultation today for symptomatic thyroid nodule, 1 month status post thyroid radiofrequency ablation   Referring Physician(s): Kumar,Ajay   History of Present Illness: Initial Consult HPI:  Kristin Daniels is a 47 y.o. female with a symptomatic left thyroid nodule.  This was first diagnosed in 2004, and at that time was approximately 2 cm.  She has remained euthyroid throughout her life.  The nodule has slowly enlarged, now measuring over 4 cm.  She underwent FNA in [place] last month which resulted negative.  She endorses new pain that is occasionally associated with her nodule.  She occasionally has a rhaspy voice, which is new.  She denies dysphagia.  She states that the nodule has gradually become readily noticeable and palpable.   She recently saw an ENT in [facility] for the nodule who recommended hemithyroidectomy.  She found out about thyroid RFA online and was graciously referred to me by Dr. Dwyane Dee for evaluation.    She is doing well since the procedure.  She states that the feeling of fullness in her neck she previously experienced has resolved entirely.  The needle entry site is healing well.  She complains of some recent migraine headaches and breast fullness.   Past Medical History:  Diagnosis Date   Acid reflux    Colon polyp    H. pylori infection    History of seasonal allergies    Hypercholesterolemia    Migraines    Pituitary tumor    Thyroid nodule    Vaginal Pap smear, abnormal     Past Surgical History:  Procedure Laterality Date   CESAREAN SECTION     IR RADIOLOGIST EVAL & MGMT  01/11/2022   OTHER SURGICAL HISTORY     hemangio removed at child and had something added to head to help indentation    Allergies: Metronidazole, Pollen extract, and Diflucan [fluconazole]  Medications: Prior to Admission medications   Medication Sig Start Date End Date Taking? Authorizing Provider  ALPRAZolam Duanne Moron) 1  MG tablet Take 1 mg by mouth daily. 06/28/20   [provider]  ARIPiprazole (ABILIFY) 30 MG tablet Take 30 mg by mouth daily. 07/19/20   [provider]  cabergoline (DOSTINEX) 0.5 MG tablet TAKE 1 TAB ON SUNDAYS AND HALF TABLET ON THURSDAYS 05/03/22   Elayne Snare, MD  cetirizine (ZYRTEC) 10 MG tablet Take 1 tablet (10 mg total) by mouth daily. 01/03/22   Erskine Emery, MD  clindamycin (CLEOCIN-T) 1 % external solution Apply topically 2 (two) times daily as needed. 01/03/22   Erskine Emery, MD  cyclobenzaprine (FLEXERIL) 5 MG tablet Take 1 tablet (5 mg total) by mouth 3 (three) times daily as needed for muscle spasms. 05/21/21   Gladys Damme, MD  desonide (DESOWEN) 0.05 % cream Apply topically 2 (two) times daily. 05/29/20   Lavonna Monarch, MD  FIBER PO Take by mouth.    [provider]  fluticasone (FLONASE) 50 MCG/ACT nasal spray Place 2 sprays into both nostrils daily. 01/03/22   Erskine Emery, MD  gabapentin (NEURONTIN) 100 MG capsule Take 2 capsules (200 mg total) by mouth 3 (three) times daily. 04/23/21 10/29/21  Regan Lemming, MD  hydrocortisone 2.5 % cream Apply topically 2 (two) times daily. 06/02/20   Lavonna Monarch, MD  Ibrexafungerp Citrate 150 MG TABS Take 2 tablets by mouth 2 (two) times daily. 07/07/21   Guss Bunde, MD  MAGNESIUM PO Take by mouth.    [provider]  Multiple Vitamin (MULTIVITAMIN) tablet Take 1 tablet by mouth daily.    [provider]  naproxen sodium (ANAPROX DS) 550 MG tablet Take 1 tablet (550 mg total) by mouth 2 (two) times daily as needed. 01/11/20   Pieter Partridge, DO  nitrofurantoin, macrocrystal-monohydrate, (MACROBID) 100 MG capsule Take 1 capsule (100 mg total) by mouth 2 (two) times daily. 02/19/22   Clarnce Flock, MD  nortriptyline (PAMELOR) 50 MG capsule TAKE 2 CAPSULES (100 MG TOTAL) BY MOUTH AT BEDTIME. 08/02/21   Tomi Likens, Adam R, DO  nystatin (MYCOSTATIN) 100000 UNIT/ML suspension Take 5 mLs (500,000  Units total) by mouth 4 (four) times daily. 05/21/21   Gladys Damme, MD  Olopatadine HCl 0.2 % SOLN Apply 1 drop to eye daily. 09/21/18   Myles Gip, DO  Omega-3 Fatty Acids (FISH OIL PO) Take by mouth.    [provider]  omeprazole (PRILOSEC) 40 MG capsule Take 1 capsule (40 mg total) by mouth daily. 03/19/22   Erskine Emery, MD  Plecanatide (TRULANCE) 3 MG TABS Take 1 tablet by mouth daily. 05/12/19   Ladene Artist, MD  Riboflavin-Magnesium-Feverfew 100-90-25 MG TABS Take 100 mg by mouth daily. 03/25/19   Kathrene Alu, MD  rizatriptan (MAXALT) 10 MG tablet Take 1 tablet (10 mg total) by mouth as needed for migraine. May repeat after 2 hours if needed.  Maximum 2 tablets in 24 hours. 10/29/21   Pieter Partridge, DO  terconazole (TERAZOL 7) 0.4 % vaginal cream Place 1 applicator vaginally at bedtime. 05/21/21   Gladys Damme, MD  topiramate (TOPAMAX) 25 MG tablet Take 1 tablet (25 mg total) by mouth at bedtime. 10/29/21   Pieter Partridge, DO  ZINC SULFATE-VITAMIN C MT Use as directed in the mouth or throat.    [provider]  zolpidem (AMBIEN) 10 MG tablet Take 10 mg by mouth at bedtime as needed. 07/19/20   [provider]     Family History  Problem Relation Age of Onset   Hypertension Mother    Hypercholesterolemia Mother    Colon polyps Maternal Grandmother    Colon cancer Neg Hx    Esophageal cancer Neg Hx    Stomach cancer Neg Hx    Rectal cancer Neg Hx     Social History   Socioeconomic History   Marital status: Single    Spouse name: Not on file   Number of children: 1   Years of education: Not on file   Highest education level: Not on file  Occupational History   Occupation: not working  Tobacco Use   Smoking status: Never   Smokeless tobacco: Never  Vaping Use   Vaping Use: Never used  Substance and Sexual Activity   Alcohol use: Yes    Comment: holidays   Drug use: No   Sexual activity: Yes    Birth control/protection:  Condom  Other Topics Concern   Not on file  Social History Narrative   Right handed   Lives in two story home with son.   Social Determinants of Health   Financial Resource Strain: Not on file  Food Insecurity: Not on file  Transportation Needs: Not on file  Physical Activity: Not on file  Stress: Not on file  Social Connections: Not on file     Vital Signs: There were no vitals taken for this visit.  No physical examination was performed in lieu of virtual telephone clinic visit.  Imaging: US thyroid  01/11/22  4.4 x 2.1 x 3.1 cm = 15 cc  US thyroid 05/17/22  3.8 x 2.1 x 1.7 cm = 7.1 cc   Labs: CBC 05/21/21 WBC 6.0, Hb 13, Hct 41.7, Plt 332   Coags None available   TFTs 07/23/21 --> 05/21/22 Serum TSH 0.65 --> 1.34 Serum free T4 0.61 --> 0.9 Serum T3 not obtained --> 111 Thyroperoxidase Antibody not obtained Thyroglobulin Antibody not obtained Calcitonin not obtained     Prior Thyroid FNA: 12/07/21 - left thyroid nodule, [place] II (per [facility], [place])   Reports of other prior left thyroid nodule FNA which were also benign    Assessment and Plan: 47 year old female with history of benign, symptomatic 4.4 (15 cc) left thyroid nodule now status post thyroid nodule radiofrequency ablation on 04/08/22.   She is recovering well with significant decrease in neck fullness.  1 month follow up ultrasound demonstrates 53% volume reduction. No significant follow up lab abnormalities.  Plan for 3 month thyroid ultrasound with clinic visit shortly thereafter.   Electronically Signed: Suzette Battiest 05/23/2022, 8:54 AM   I spent a total of 25 Minutes in virtual telephone clinical consultation, greater than 50% of which was counseling/coordinating care for symptomatic benign thyroid nodule.

## 2022-06-01 ENCOUNTER — Encounter: Payer: Self-pay | Admitting: Endocrinology

## 2022-06-03 ENCOUNTER — Other Ambulatory Visit: Payer: Self-pay | Admitting: Neurology

## 2022-06-03 ENCOUNTER — Telehealth: Payer: Self-pay | Admitting: Endocrinology

## 2022-06-03 DIAGNOSIS — D352 Benign neoplasm of pituitary gland: Secondary | ICD-10-CM

## 2022-06-03 NOTE — Telephone Encounter (Signed)
New message   H/o ablation done 12/23  C/o sore tender breast , extreme migraine , feel cold.Marland Kitchen   PCP was not contacted.

## 2022-06-06 ENCOUNTER — Other Ambulatory Visit: Payer: Medicaid Other

## 2022-06-10 ENCOUNTER — Ambulatory Visit: Payer: Medicaid Other | Admitting: Physical Therapy

## 2022-06-10 ENCOUNTER — Other Ambulatory Visit (INDEPENDENT_AMBULATORY_CARE_PROVIDER_SITE_OTHER): Payer: PRIVATE HEALTH INSURANCE

## 2022-06-10 DIAGNOSIS — D352 Benign neoplasm of pituitary gland: Secondary | ICD-10-CM

## 2022-06-10 DIAGNOSIS — E042 Nontoxic multinodular goiter: Secondary | ICD-10-CM

## 2022-06-10 LAB — T4, FREE: Free T4: 0.62 ng/dL (ref 0.60–1.60)

## 2022-06-10 LAB — TSH: TSH: 1.58 u[IU]/mL (ref 0.35–5.50)

## 2022-06-11 LAB — PROLACTIN: Prolactin: 17.2 ng/mL (ref 4.8–33.4)

## 2022-06-12 ENCOUNTER — Encounter: Payer: Self-pay | Admitting: Endocrinology

## 2022-06-12 ENCOUNTER — Ambulatory Visit (INDEPENDENT_AMBULATORY_CARE_PROVIDER_SITE_OTHER): Payer: PRIVATE HEALTH INSURANCE | Admitting: Endocrinology

## 2022-06-12 VITALS — BP 126/74 | HR 85 | Ht 62.0 in | Wt 174.4 lb

## 2022-06-12 DIAGNOSIS — E041 Nontoxic single thyroid nodule: Secondary | ICD-10-CM | POA: Diagnosis not present

## 2022-06-12 DIAGNOSIS — E221 Hyperprolactinemia: Secondary | ICD-10-CM

## 2022-06-12 DIAGNOSIS — Z87898 Personal history of other specified conditions: Secondary | ICD-10-CM | POA: Diagnosis not present

## 2022-06-12 MED ORDER — CABERGOLINE 0.5 MG PO TABS: ORAL_TABLET | ORAL | 1 refills | Status: DC

## 2022-06-12 NOTE — Progress Notes (Signed)
Patient ID: Kristin Daniels, female   DOB: 30-Sep-1975, 47 y.o.   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 to her physician with complaints of milky discharge from her breasts and headaches She had not had any late or missed menstrual cycles at that time She reportedly was diagnosed to have a prolactinoma and started on cabergoline She does not know what her baseline prolactin level was  She had been followed by an endocrinologist in [place] state with periodic lab work and MRIs and continued on cabergoline Only a few reports are available from previous records and not clear if her prolactin has been consistently controlled In 12/16 her prolactin was 45 About 6 months prior to her visit in December 2018 with her PCP she was told to start taking 1/2 tablet only once a week with her Dostinex  RECENT history: In late 2018 started having some headaches and also breast fullness and mild galactorrhea When her prolactin level was 29 she was told to increase her dosage to half tablet twice a week With relatively higher prolactin levels she is usually has symptoms of her breasts swelling, feeling full and tender.  Also would have a milky discharge on expression but no change in menstrual cycles.  On her visit in 6/22 for a couple of weeks she had missed an occasional dose of her cabergoline and prolactin was high normal at 22.7 without any change in symptoms She is still taking 1 tablet alternating with half tablet each week  As before she still has some breast tenderness and heaviness along with the occasional breast discharge with manual expression, she thinks this is bothersome and wanted another prolactin level checked  She has taken her cabergoline regularly  Most recent MRI scan of her brain and pituitary gland showed no change in the sellar nodule which previously was labeled  likely adenoma  Report of MRI ordered by neurologist on 6/23: Unchanged 8 x 6 mm sellar nodule, 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  Prolactin is now 17  Prolactin levels:  Lab Results  Component Value Date   PROLACTIN 17.2 06/10/2022   PROLACTIN 13.6 01/30/2022   PROLACTIN 17.5 07/23/2021   PROLACTIN 9.1 02/22/2021   MRI of pituitary gland was done by neurologist in the 05/2019 which showed an 8 mm hyperintense mass in the posterior sella.  No comparison was made with prior MRI as below  MRI of pituitary gland as of 03/15/2016 shows a 7 mm posterior central left-sided lobulated pituitary adenoma extending to the insertion of the infundibulum which is slightly deviated towards the right  Past history:  During her pregnancy when she was not on regular medication she developed severe diabetes insipidus  which was controlled with her restarting treatment and she took initially bromocriptine and subsequently cabergoline during her pregnancy   PROBLEM 2:  Multinodular goiter.  She had this diagnosed on routine exam several years ago  No prior ultrasound reports are available when [place] was [place] out of state and no description of her exam is available from previous notes.  However baseline size of the nodule on the left was 2.8 cm in 2013 Records have shown that she had needle aspiration biopsies of the left dominant nodule 3 times, see below.  Biopsies have been benign consistently including Afirma testing  Thyroid nodule measured 4.4  cm in 04/2020 compared to 3.7 cm as of 2018  The previous  needle aspiration cytology showed scant but normal follicular epithelium  She has had symptoms of local pressure sensation, difficulty swallowing or choking Since she was concerned about the appearance of her neck swelling she was treated with radiofrequency ablation by the interventional radiologist on 04/08/2022  Previously in 11/21 her left-sided nodule was  about 3-3.5 cm on exam  She does complain of fatigue and cold intolerance for some time  Her TSH levels have been  generally low normal including a couple of readings below normal but not suppressed TSH is now relatively higher around 1.3-1.6 Free T4 also low normal as before  Lab Results  Component Value Date   TSH 1.58 06/10/2022   TSH 1.34 05/21/2022   TSH 0.77 01/30/2022   FREET4 0.62 06/10/2022   FREET4 0.9 05/21/2022   FREET4 0.69 01/30/2022   Lab Results  Component Value Date   T3FREE 2.5 10/23/2020   T3FREE 3.0 03/13/2020   T3FREE 3.6 09/08/2018     Previous studies:   Her first biopsy was done in 09/2011 which had shown indeterminate cytology and this was confirmed to be benign on Affirma testing At that time her nodule was 2.8 cm  Needle aspiration done in 01/2015 indicated she had a 3 cm nodule that previously had been benign; this showed scanty cellular specimen along with colloid and degenerated macrophages In 08/2016 the needle aspiration biopsy showed a benign follicular adenoma and the nodule size was indicated at 3.7 cm    Allergies as of 06/12/2022       Reactions   Metronidazole Anaphylaxis, Hives   Pollen Extract    Diflucan [fluconazole] Rash   Broke out in a rash with hives around her mouth, hands, face.        Medication List        Accurate as of June 12, 2022 11:55 AM. If you have any questions, ask your nurse or doctor.          ALPRAZolam 1 MG tablet Commonly known as: XANAX Take 1 mg by mouth daily.   ARIPiprazole 30 MG tablet Commonly known as: ABILIFY Take 30 mg by mouth daily.   cabergoline 0.5 MG tablet Commonly known as: DOSTINEX TAKE 1 TAB ON SUNDAYS AND HALF TABLET ON THURSDAYS   cetirizine 10 MG tablet Commonly known as: ZYRTEC Take 1 tablet (10 mg total) by mouth daily.   clindamycin 1 % external solution Commonly known as: Cleocin-T Apply topically 2 (two) times daily as needed.   cyclobenzaprine 5 MG  tablet Commonly known as: FLEXERIL Take 1 tablet (5 mg total) by mouth 3 (three) times daily as needed for muscle spasms.   desonide 0.05 % cream Commonly known as: DESOWEN Apply topically 2 (two) times daily.   FIBER PO Take by mouth.   FISH OIL PO Take by mouth.   fluticasone 50 MCG/ACT nasal spray Commonly known as: FLONASE Place 2 sprays into both nostrils daily.   gabapentin 100 MG capsule Commonly known as: Neurontin Take 2 capsules (200 mg total) by mouth 3 (three) times daily.   hydrocortisone 2.5 % cream Apply topically 2 (two) times daily.   Ibrexafungerp Citrate 150 MG Tabs Take 2 tablets by mouth 2 (two) times daily.   MAGNESIUM PO Take by mouth.   multivitamin tablet Take 1 tablet by mouth daily.   naproxen sodium 550 MG tablet Commonly known as: Anaprox DS Take 1 tablet (550  mg total) by mouth 2 (two) times daily as needed.   nitrofurantoin (macrocrystal-monohydrate) 100 MG capsule Commonly known as: MACROBID Take 1 capsule (100 mg total) by mouth 2 (two) times daily.   nortriptyline 50 MG capsule Commonly known as: PAMELOR TAKE 2 CAPSULES (100 MG TOTAL) BY MOUTH AT BEDTIME.   nystatin 100000 UNIT/ML suspension Commonly known as: MYCOSTATIN Take 5 mLs (500,000 Units total) by mouth 4 (four) times daily.   Olopatadine HCl 0.2 % Soln Apply 1 drop to eye daily.   omeprazole 40 MG capsule Commonly known as: PRILOSEC Take 1 capsule (40 mg total) by mouth daily.   Riboflavin-Magnesium-Feverfew 100-90-25 MG Tabs Take 100 mg by mouth daily.   rizatriptan 10 MG tablet Commonly known as: Maxalt Take 1 tablet (10 mg total) by mouth as needed for migraine. May repeat after 2 hours if needed.  Maximum 2 tablets in 24 hours.   terconazole 0.4 % vaginal cream Commonly known as: TERAZOL 7 Place 1 applicator vaginally at bedtime.   topiramate 25 MG tablet Commonly known as: TOPAMAX Take 1 tablet (25 mg total) by mouth at bedtime.   Trulance 3 MG  Tabs Generic drug: Plecanatide Take 1 tablet by mouth daily.   ZINC SULFATE-VITAMIN C MT Use as directed in the mouth or throat.   zolpidem 10 MG tablet Commonly known as: AMBIEN Take 10 mg by mouth at bedtime as needed.        Allergies:  Allergies  Allergen Reactions   Metronidazole Anaphylaxis and Hives   Pollen Extract    Diflucan [Fluconazole] Rash    Broke out in a rash with hives around her mouth, hands, face.    Past Medical History:  Diagnosis Date   Acid reflux    Colon polyp    H. pylori infection    History of seasonal allergies    Hypercholesterolemia    Migraines    Pituitary tumor    Thyroid nodule    Vaginal Pap smear, abnormal     Past Surgical History:  Procedure Laterality Date   CESAREAN SECTION     IR RADIOLOGIST EVAL & MGMT  01/11/2022   IR RADIOLOGIST EVAL & MGMT  05/23/2022   OTHER SURGICAL HISTORY     hemangio removed at child and had something added to head to help indentation    Family History  Problem Relation Age of Onset   Hypertension Mother    Hypercholesterolemia Mother    Colon polyps Maternal Grandmother    Colon cancer Neg Hx    Esophageal cancer Neg Hx    Stomach cancer Neg Hx    Rectal cancer Neg Hx     Social History:  reports that she has never smoked. She has never used smokeless tobacco. She reports current alcohol use. She reports that she does not use drugs.   Review of Systems  EXAM:  BP 126/74 (BP Location: Left Arm, Patient Position: Sitting, Cuff Size: Normal)   Pulse 85   Ht '5\' 2"'$  (1.575 m)   Wt 174 lb 6.4 oz (79.1 kg)   SpO2 98%   BMI 31.90 kg/m   Physical Exam   Left thyroid nodule is about 1.5-2 cm in size, smooth, firm, nontender No other nodules felt in the thyroid   ASSESSMENT:     Prolactinoma since 2009 with history of sellar mass that was heterogenous and 7 mm in original size and presenting with headaches and galactorrhea.   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  She has had good results of the radiofrequency ablation of her dominant thyroid nodule on the left and this appears to be at least 50% smaller  Discussed that her TSH levels are not as low possibly since the thyroid nodule she had may have been partially autonomous and this is resolving after ablation, unlikely that her thyroid levels explain her fatigue and cold intolerance    PLAN:     She will now try a higher dose of cabergoline, taking 1 tablet twice a week to see if she will get symptomatic relief of her mastodynia Will recheck thyroid function on her next visit also    Elayne Snare 06/12/22

## 2022-06-18 ENCOUNTER — Ambulatory Visit: Payer: Medicaid Other

## 2022-07-15 ENCOUNTER — Other Ambulatory Visit: Payer: Self-pay | Admitting: Endocrinology

## 2022-07-15 DIAGNOSIS — E041 Nontoxic single thyroid nodule: Secondary | ICD-10-CM

## 2022-07-16 ENCOUNTER — Other Ambulatory Visit: Payer: PRIVATE HEALTH INSURANCE

## 2022-07-23 ENCOUNTER — Telehealth: Payer: PRIVATE HEALTH INSURANCE | Admitting: Physician Assistant

## 2022-07-23 ENCOUNTER — Encounter: Payer: Self-pay | Admitting: Student

## 2022-07-23 DIAGNOSIS — M26609 Unspecified temporomandibular joint disorder, unspecified side: Secondary | ICD-10-CM | POA: Diagnosis not present

## 2022-07-23 DIAGNOSIS — K130 Diseases of lips: Secondary | ICD-10-CM | POA: Diagnosis not present

## 2022-07-23 MED ORDER — MELOXICAM 15 MG PO TABS: 15.0000 mg | ORAL_TABLET | Freq: Every day | ORAL | 0 refills | Status: DC

## 2022-07-23 MED ORDER — NYSTATIN-TRIAMCINOLONE 100000-0.1 UNIT/GM-% EX OINT: 1.0000 | TOPICAL_OINTMENT | Freq: Two times a day (BID) | CUTANEOUS | 0 refills | Status: DC

## 2022-07-23 MED ORDER — TIZANIDINE HCL 2 MG PO TABS: 2.0000 mg | ORAL_TABLET | Freq: Every day | ORAL | 0 refills | Status: AC

## 2022-07-23 NOTE — Progress Notes (Signed)
Virtual Visit Consent   Kristin Daniels, you are scheduled for a virtual visit with a [facility] provider today. Just as with appointments in the office, your consent must be obtained to participate. Your consent will be active for this visit and any virtual visit you may have with one of our providers in the next 365 days. If you have a MyChart account, a copy of this consent can be sent to you electronically.  As this is a virtual visit, video technology does not allow for your provider to perform a traditional examination. This may limit your provider's ability to fully assess your condition. If your provider identifies any concerns that need to be evaluated in person or the need to arrange testing (such as labs, EKG, etc.), we will make arrangements to do so. Although advances in technology are sophisticated, we cannot ensure that it will always work on either your end or our end. If the connection with a video visit is poor, the visit may have to be switched to a telephone visit. With either a video or telephone visit, we are not always able to ensure that we have a secure connection.  By engaging in this virtual visit, you consent to the provision of healthcare and authorize for your insurance to be billed (if applicable) for the services provided during this visit. Depending on your insurance coverage, you may receive a charge related to this service.  I need to obtain your verbal consent now. Are you willing to proceed with your visit today? Akera Canner has provided verbal consent on 07/23/2022 for a virtual visit (video or telephone). Kristin Daniels, [place]  Date: 07/23/2022 5:28 PM  Virtual Visit via Video Note   I, Kristin Daniels, connected with  Kristin Daniels  ([id number], 07-11-1975) on 07/23/22 at  4:45 PM EDT by a video-enabled telemedicine application and verified that I am speaking with the correct person using two identifiers.  Location: Patient: Virtual Visit Location  Patient: Home Provider: Virtual Visit Location Provider: Home Office   I discussed the limitations of evaluation and management by telemedicine and the availability of in person appointments. The patient expressed understanding and agreed to proceed.    History of Present Illness: Kristin Daniels is a 47 y.o. who identifies as a female who was assigned female at birth, and is being seen today for possible referral for her chronic and worsening R TMJ.  Patient endorses ongoing right TMJ issues, previously milder but has been worse since she had to have a crown placed on her tooth.  Has had multiple follow-up visits with a dentist who says everything looks good and who provided nightguard/retainers to help with TMJ.  Notes substantial tooth grinding at night worsening her TMJ despite this.  Has taken Tylenol and ibuprofen over-the-counter with little to no relief.  States this is really impacting her sleep and now her daily life.  Of note, she did message her primary care provider earlier to request a referral but has not heard back yet.  Patient also noting some dry, flaking areas at the corners of her lips with hyperpigmentation.  Has been there for a while.  Applying Aquaphor but with no improvement.   HPI: HPI  Problems:  Patient Active Problem List   Diagnosis Date Noted   Screening for diabetes mellitus 03/15/2022   Need for hepatitis B screening test 03/15/2022   Other fatigue 03/15/2022   Dysuria 03/15/2022   Benign schwannoma 01/03/2022   Dyspareunia in female 01/03/2022  Vaginal candidiasis 05/21/2021   Oral candidiasis 05/21/2021   Bilateral leg pain 05/21/2021   Lumbar radiculopathy, acute 04/10/2021   Costochondral chest pain 12/01/2020   Pain of right lower leg 12/01/2020   Abdominal wall fluid collections 12/01/2020   Seasonal allergic rhinitis due to pollen 08/28/2020   Acne 07/26/2020   Physically well but worried 07/26/2020   H. 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   Medications:  Current Outpatient Medications:    meloxicam (MOBIC) 15 MG tablet, Take 1 tablet (15 mg total) by mouth daily., Disp: 15 tablet, Rfl: 0   nystatin-triamcinolone ointment (MYCOLOG), Apply 1 Application topically 2 (two) times daily., Disp: 30 g, Rfl: 0   tiZANidine (ZANAFLEX) 2 MG tablet, Take 1 tablet (2 mg total) by mouth at bedtime., Disp: 15 tablet, Rfl: 0   ALPRAZolam (XANAX) 1 MG tablet, Take 1 mg by mouth daily., Disp: , Rfl:    ARIPiprazole (ABILIFY) 30 MG tablet, Take 30 mg by mouth daily., Disp: , Rfl:    cabergoline (DOSTINEX) 0.5 MG tablet, 1 pill twice a week, Disp: 24 tablet, Rfl: 1   cetirizine (ZYRTEC) 10 MG tablet, Take 1 tablet (10 mg total) by mouth daily., Disp: 30 tablet, Rfl: 11   clindamycin (CLEOCIN-T) 1 % external solution, Apply topically 2 (two) times daily as needed., Disp: 30 mL, Rfl: 2   cyclobenzaprine (FLEXERIL) 5 MG tablet, Take 1 tablet (5 mg total) by mouth 3 (three) times daily as needed for muscle spasms., Disp: 30 tablet, Rfl: 0   FIBER PO, Take by mouth., Disp: , Rfl:    fluticasone (FLONASE) 50 MCG/ACT nasal spray, Place 2 sprays into both nostrils daily., Disp: 16 g, Rfl: 6   gabapentin (NEURONTIN) 100 MG capsule, Take 2 capsules (200 mg total) by mouth 3 (three) times daily., Disp: 180 capsule, Rfl: 0   Ibrexafungerp Citrate 150 MG TABS, Take 2 tablets by mouth 2 (two) times daily.,  Disp: 4 tablet, Rfl: 0   MAGNESIUM PO, Take by mouth., Disp: , Rfl:    Multiple Vitamin (MULTIVITAMIN) tablet, Take 1 tablet by mouth daily., Disp: , Rfl:    naproxen sodium (ANAPROX DS) 550 MG tablet, Take 1 tablet (550 mg total) by mouth 2 (two) times daily as needed., Disp: 20 tablet, Rfl: 5   nitrofurantoin, macrocrystal-monohydrate, (MACROBID) 100 MG capsule, Take 1 capsule (100 mg total) by mouth 2 (two) times daily., Disp: 14 capsule, Rfl: 0   nortriptyline (PAMELOR) 50 MG capsule, TAKE 2 CAPSULES (100 MG TOTAL) BY MOUTH AT BEDTIME., Disp: 180 capsule, Rfl: 2   nystatin (MYCOSTATIN) 100000 UNIT/ML suspension, Take 5 mLs (500,000 Units total) by mouth 4 (four) times daily., Disp: 60 mL, Rfl: 0   Olopatadine HCl 0.2 % SOLN, Apply 1 drop to eye daily., Disp: 1 Bottle, Rfl: 0   Omega-3 Fatty Acids (FISH OIL PO), Take by mouth., Disp: , Rfl:    omeprazole (PRILOSEC)  40 MG capsule, Take 1 capsule (40 mg total) by mouth daily., Disp: 30 capsule, Rfl: 0   Plecanatide (TRULANCE) 3 MG TABS, Take 1 tablet by mouth daily., Disp: 30 tablet, Rfl: 11   Riboflavin-Magnesium-Feverfew 100-90-25 MG TABS, Take 100 mg by mouth daily., Disp: 30 tablet, Rfl: 0   rizatriptan (MAXALT) 10 MG tablet, Take 1 tablet (10 mg total) by mouth as needed for migraine. May repeat after 2 hours if needed.  Maximum 2 tablets in 24 hours., Disp: 10 tablet, Rfl: 5   terconazole (TERAZOL 7) 0.4 % vaginal cream, Place 1 applicator vaginally at bedtime., Disp: 45 g, Rfl: 0   topiramate (TOPAMAX) 25 MG tablet, Take 1 tablet (25 mg total) by mouth at bedtime., Disp: 30 tablet, Rfl: 5   ZINC SULFATE-VITAMIN C MT, Use as directed in the mouth or throat., Disp: , Rfl:    zolpidem (AMBIEN) 10 MG tablet, Take 10 mg by mouth at bedtime as needed., Disp: , Rfl:   Observations/Objective: Patient is well-developed, well-nourished in no acute distress.  Resting comfortably  at home.  Head is normocephalic, atraumatic.  No labored breathing.   Speech is clear and coherent with logical content.  Patient is alert and oriented at baseline.  Hyperpigmentation, flaking noted at the creases of the lips, concerning for angular cheilitis  Assessment and Plan: 1. TMJ dysfunction - tiZANidine (ZANAFLEX) 2 MG tablet; Take 1 tablet (2 mg total) by mouth at bedtime.  Dispense: 15 tablet; Refill: 0 - meloxicam (MOBIC) 15 MG tablet; Take 1 tablet (15 mg total) by mouth daily.  Dispense: 15 tablet; Refill: 0  Ongoing and now deteriorating.  No improvement with recommendations from her dental provider.  She does need a referral to ENT but discussed this cannot be provided via our virtual urgent care department.  Thankfully she already sent a message to her primary care provider today requesting the referral.  She is to follow-up on this.  Will give trial of meloxicam once daily along with OTC Tylenol.  Tizanidine at night to relax muscles and hopefully cut down on tooth grinding.  2. Angular cheilitis - nystatin-triamcinolone ointment (MYCOLOG); Apply 1 Application topically 2 (two) times daily.  Dispense: 30 g; Refill: 0  Supportive measures and skin care discussed.  Will start trial of Mycolog ointment twice daily.  Follow Up Instructions: I discussed the assessment and treatment plan with the patient. The patient was provided an opportunity to ask questions and all were answered. The patient agreed with the plan and demonstrated an understanding of the instructions.  A copy of instructions were sent to the patient via MyChart unless otherwise noted below.   The patient was advised to call back or seek an in-person evaluation if the symptoms worsen or if the condition fails to improve as anticipated.  Time:  I spent 18 minutes with the patient via telehealth technology discussing the above problems/concerns.    Kristin Rio, PA-C

## 2022-07-23 NOTE — Patient Instructions (Signed)
Heena Shaler, thank you for joining [name], PA-C for today's virtual visit.  While this provider is not your primary care provider (PCP), if your PCP is located in our provider database this encounter information will be shared with them immediately following your visit.   New [facility] account gives you access to today's visit and all your visits, tests, and labs performed at [facility] " click here if you don't have a [facility] account or go to [email or web address]  Consent: (Patient) Kristin Daniels provided verbal consent for this virtual visit at the beginning of the encounter.  Current Medications:  Current Outpatient Medications:    ALPRAZolam (XANAX) 1 MG tablet, Take 1 mg by mouth daily., Disp: , Rfl:    ARIPiprazole (ABILIFY) 30 MG tablet, Take 30 mg by mouth daily., Disp: , Rfl:    cabergoline (DOSTINEX) 0.5 MG tablet, 1 pill twice a week, Disp: 24 tablet, Rfl: 1   cetirizine (ZYRTEC) 10 MG tablet, Take 1 tablet (10 mg total) by mouth daily., Disp: 30 tablet, Rfl: 11   clindamycin (CLEOCIN-T) 1 % external solution, Apply topically 2 (two) times daily as needed., Disp: 30 mL, Rfl: 2   cyclobenzaprine (FLEXERIL) 5 MG tablet, Take 1 tablet (5 mg total) by mouth 3 (three) times daily as needed for muscle spasms., Disp: 30 tablet, Rfl: 0   desonide (DESOWEN) 0.05 % cream, Apply topically 2 (two) times daily., Disp: 30 g, Rfl: 0   FIBER PO, Take by mouth., Disp: , Rfl:    fluticasone (FLONASE) 50 MCG/ACT nasal spray, Place 2 sprays into both nostrils daily., Disp: 16 g, Rfl: 6   gabapentin (NEURONTIN) 100 MG capsule, Take 2 capsules (200 mg total) by mouth 3 (three) times daily., Disp: 180 capsule, Rfl: 0   hydrocortisone 2.5 % cream, Apply topically 2 (two) times daily., Disp: 90 g, Rfl: 1   Ibrexafungerp Citrate 150 MG TABS, Take 2 tablets by mouth 2 (two) times daily., Disp: 4 tablet, Rfl: 0   MAGNESIUM PO, Take by mouth., Disp: ,  Rfl:    Multiple Vitamin (MULTIVITAMIN) tablet, Take 1 tablet by mouth daily., Disp: , Rfl:    naproxen sodium (ANAPROX DS) 550 MG tablet, Take 1 tablet (550 mg total) by mouth 2 (two) times daily as needed., Disp: 20 tablet, Rfl: 5   nitrofurantoin, macrocrystal-monohydrate, (MACROBID) 100 MG capsule, Take 1 capsule (100 mg total) by mouth 2 (two) times daily., Disp: 14 capsule, Rfl: 0   nortriptyline (PAMELOR) 50 MG capsule, TAKE 2 CAPSULES (100 MG TOTAL) BY MOUTH AT BEDTIME., Disp: 180 capsule, Rfl: 2   nystatin (MYCOSTATIN) 100000 UNIT/ML suspension, Take 5 mLs (500,000 Units total) by mouth 4 (four) times daily., Disp: 60 mL, Rfl: 0   Olopatadine HCl 0.2 % SOLN, Apply 1 drop to eye daily., Disp: 1 Bottle, Rfl: 0   Omega-3 Fatty Acids (FISH OIL PO), Take by mouth., Disp: , Rfl:    omeprazole (PRILOSEC) 40 MG capsule, Take 1 capsule (40 mg total) by mouth daily., Disp: 30 capsule, Rfl: 0   Plecanatide (TRULANCE) 3 MG TABS, Take 1 tablet by mouth daily., Disp: 30 tablet, Rfl: 11   Riboflavin-Magnesium-Feverfew 100-90-25 MG TABS, Take 100 mg by mouth daily., Disp: 30 tablet, Rfl: 0   rizatriptan (MAXALT) 10 MG tablet, Take 1 tablet (10 mg total) by mouth as needed for migraine. May repeat after 2 hours if needed.  Maximum 2 tablets in 24 hours., Disp: 10 tablet, Rfl: 5  terconazole (TERAZOL 7) 0.4 % vaginal cream, Place 1 applicator vaginally at bedtime., Disp: 45 g, Rfl: 0   topiramate (TOPAMAX) 25 MG tablet, Take 1 tablet (25 mg total) by mouth at bedtime., Disp: 30 tablet, Rfl: 5   ZINC SULFATE-VITAMIN C MT, Use as directed in the mouth or throat., Disp: , Rfl:    zolpidem (AMBIEN) 10 MG tablet, Take 10 mg by mouth at bedtime as needed., Disp: , Rfl:    Medications ordered in this encounter:  No orders of the defined types were placed in this encounter.    *If you need refills on other medications prior to your next appointment, please contact your pharmacy*  Follow-Up: Call back or seek  an in-person evaluation if the symptoms worsen or if the condition fails to improve as anticipated.  [place] [phone]  Other Instructions Please keep hydrated. Try to avoid chewing on the affected side. Continue use of your nightguard/retainers. Continue use of ice packs. Start meloxicam once daily as directed.  Okay to take over-the-counter Tylenol along with this. Start the tizanidine in the evening. Follow-up with your primary care provider regarding an ENT referral.  For the mouth, keep the skin clean and dry. Apply the Mycolog ointment as directed.  You only need a very small amount each time. Can also use a thin layer of Vaseline. If symptoms or not resolving or you note any new or worsening symptoms, please follow-up with your primary care provider.   If you have been instructed to have an in-person evaluation today at a local Urgent Care facility, please use the link below. It will take you to a list of all of our available [facility], including address, phone number and hours of operation. Please do not delay care.  [facility]  If you or a family member do not have a primary care provider, use the link below to schedule a visit and establish care. When you choose a [facility] primary care physician or advanced practice provider, you gain a long-term partner in health. Find a Primary Care Provider  Learn more about [facility] in-office and virtual care options: [facility]

## 2022-07-27 ENCOUNTER — Other Ambulatory Visit: Payer: Self-pay | Admitting: Student

## 2022-07-27 DIAGNOSIS — R6884 Jaw pain: Secondary | ICD-10-CM

## 2022-07-27 NOTE — Progress Notes (Signed)
Providing PT referral for TMJ pain and exercises. If persists, needs to come in

## 2022-08-05 ENCOUNTER — Other Ambulatory Visit: Payer: Medicaid Other

## 2022-08-07 ENCOUNTER — Ambulatory Visit: Payer: Medicaid Other | Admitting: Endocrinology

## 2022-08-08 ENCOUNTER — Ambulatory Visit: Payer: PRIVATE HEALTH INSURANCE

## 2022-08-08 ENCOUNTER — Telehealth: Payer: Self-pay

## 2022-08-08 ENCOUNTER — Other Ambulatory Visit: Payer: Self-pay | Admitting: Neurology

## 2022-08-08 NOTE — Telephone Encounter (Signed)
Telephone call from Kristin Daniels, Patient has two Insurances, Her primary insurance is not in [employer]. In network providers are In [employer].   Will call patient to discuss.

## 2022-08-12 ENCOUNTER — Other Ambulatory Visit: Payer: Medicaid Other

## 2022-08-14 ENCOUNTER — Other Ambulatory Visit: Payer: PRIVATE HEALTH INSURANCE

## 2022-08-15 ENCOUNTER — Ambulatory Visit: Payer: Medicaid Other | Admitting: Endocrinology

## 2022-08-23 ENCOUNTER — Ambulatory Visit: Payer: PRIVATE HEALTH INSURANCE | Admitting: Family Medicine

## 2022-08-27 ENCOUNTER — Ambulatory Visit
Admission: RE | Admit: 2022-08-27 | Discharge: 2022-08-27 | Disposition: A | Discharge status: Home / Self Care | Payer: BC Managed Care – PPO | Source: Ambulatory Visit | Attending: Endocrinology | Admitting: Endocrinology

## 2022-08-27 DIAGNOSIS — E041 Nontoxic single thyroid nodule: Secondary | ICD-10-CM

## 2022-08-28 ENCOUNTER — Other Ambulatory Visit: Payer: Self-pay | Admitting: Interventional Radiology

## 2022-08-28 DIAGNOSIS — E041 Nontoxic single thyroid nodule: Secondary | ICD-10-CM

## 2022-08-29 ENCOUNTER — Encounter: Payer: Self-pay | Admitting: Endocrinology

## 2022-08-30 ENCOUNTER — Other Ambulatory Visit: Payer: Self-pay | Admitting: Student

## 2022-09-03 ENCOUNTER — Ambulatory Visit
Admission: RE | Admit: 2022-09-03 | Discharge: 2022-09-03 | Disposition: A | Discharge status: Home / Self Care | Payer: BC Managed Care – PPO | Source: Ambulatory Visit | Attending: Interventional Radiology | Admitting: Interventional Radiology

## 2022-09-03 DIAGNOSIS — E041 Nontoxic single thyroid nodule: Secondary | ICD-10-CM

## 2022-09-03 HISTORY — PX: IR RADIOLOGIST EVAL & MGMT: IMG5224

## 2022-09-03 NOTE — Progress Notes (Signed)
Reason for follow up: Patient was seen in virtual telephone clinic visit today for symptomatic thyroid nodule, 4 months status post thyroid radiofrequency ablation   Referring Physician(s): Kumar,Ajay   History of Present Illness: Initial Consult HPI:  Kristin Daniels is a 47 y.o. female with a symptomatic left thyroid nodule.  This was first diagnosed in 2004, and at that time was approximately 2 cm.  She has remained euthyroid throughout her life.  The nodule has slowly enlarged, now measuring over 4 cm.  She underwent FNA in [place] last month which resulted negative.  She endorses new pain that is occasionally associated with her nodule.  She occasionally has a rhaspy voice, which is new.  She denies dysphagia.  She states that the nodule has gradually become readily noticeable and palpable.   She recently saw an ENT in [facility] for the nodule who recommended hemithyroidectomy.  She found out about thyroid RFA online and was graciously referred to me by Dr. Lucianne Muss for evaluation.   She continues to do well since the procedure.  She states that the feeling of fullness in her neck she previously experienced has resolved entirely.  She does not notice the nodule anymore visually, only by deep palpation. She continues to complain of migraine headaches and breast fullness.    Past Medical History:  Diagnosis Date   Acid reflux    Colon polyp    H. pylori infection    History of seasonal allergies    Hypercholesterolemia    Migraines    Pituitary tumor    Thyroid nodule    Vaginal Pap smear, abnormal     Past Surgical History:  Procedure Laterality Date   CESAREAN SECTION     IR RADIOLOGIST EVAL & MGMT  01/11/2022   IR RADIOLOGIST EVAL & MGMT  05/23/2022   OTHER SURGICAL HISTORY     hemangio removed at child and had something added to head to help indentation    Allergies: Metronidazole, Pollen extract, and Diflucan [fluconazole]  Medications: Prior to Admission medications    Medication Sig Start Date End Date Taking? Authorizing Provider  ALPRAZolam Prudy Feeler) 1 MG tablet Take 1 mg by mouth daily. 06/28/20   [provider]  ARIPiprazole (ABILIFY) 30 MG tablet Take 30 mg by mouth daily. 07/19/20   [provider]  cabergoline (DOSTINEX) 0.5 MG tablet 1 pill twice a week 06/12/22   Reather Littler, MD  cetirizine (ZYRTEC) 10 MG tablet Take 1 tablet (10 mg total) by mouth daily. 01/03/22   Alfredo Martinez, MD  clindamycin (CLEOCIN-T) 1 % external solution Apply topically 2 (two) times daily as needed. 01/03/22   Alfredo Martinez, MD  cyclobenzaprine (FLEXERIL) 5 MG tablet Take 1 tablet (5 mg total) by mouth 3 (three) times daily as needed for muscle spasms. 05/21/21   Shirlean Mylar, MD  FIBER PO Take by mouth.    [provider]  fluticasone (FLONASE) 50 MCG/ACT nasal spray Place 2 sprays into both nostrils daily. 01/03/22   Alfredo Martinez, MD  gabapentin (NEURONTIN) 100 MG capsule Take 2 capsules (200 mg total) by mouth 3 (three) times daily. 04/23/21 10/29/21  Ernie Avena, MD  Ibrexafungerp Citrate 150 MG TABS Take 2 tablets by mouth 2 (two) times daily. 07/07/21   Lesly Dukes, MD  MAGNESIUM PO Take by mouth.    [provider]  meloxicam (MOBIC) 15 MG tablet Take 1 tablet (15 mg total) by mouth daily. 07/23/22   Waldon Merl, PA-C  Multiple Vitamin (MULTIVITAMIN) tablet Take 1 tablet by mouth daily.    [provider]  naproxen sodium (ANAPROX DS) 550 MG tablet Take 1 tablet (550 mg total) by mouth 2 (two) times daily as needed. 01/11/20   Drema Dallas, DO  nitrofurantoin, macrocrystal-monohydrate, (MACROBID) 100 MG capsule Take 1 capsule (100 mg total) by mouth 2 (two) times daily. 02/19/22   Venora Maples, MD  nortriptyline (PAMELOR) 50 MG capsule TAKE 2 CAPSULES (100 MG TOTAL) BY MOUTH AT BEDTIME. 08/02/21   Everlena Cooper, Adam R, DO  nystatin (MYCOSTATIN) 100000 UNIT/ML suspension Take 5 mLs (500,000 Units total) by mouth 4  (four) times daily. 05/21/21   Shirlean Mylar, MD  nystatin-triamcinolone ointment [facility]) Apply 1 Application topically 2 (two) times daily. 07/23/22   Waldon Merl, PA-C  Olopatadine HCl 0.2 % SOLN Apply 1 drop to eye daily. 09/21/18   Caro Laroche, DO  Omega-3 Fatty Acids (FISH OIL PO) Take by mouth.    [provider]  omeprazole (PRILOSEC) 40 MG capsule TAKE 1 CAPSULE (40 MG TOTAL) BY MOUTH DAILY. 09/01/22   Alfredo Martinez, MD  Plecanatide (TRULANCE) 3 MG TABS Take 1 tablet by mouth daily. 05/12/19   Meryl Dare, MD  Riboflavin-Magnesium-Feverfew 100-90-25 MG TABS Take 100 mg by mouth daily. 03/25/19   Lennox Solders, MD  rizatriptan (MAXALT) 10 MG tablet Take 1 tablet (10 mg total) by mouth as needed for migraine. May repeat after 2 hours if needed.  Maximum 2 tablets in 24 hours. 10/29/21   Drema Dallas, DO  terconazole (TERAZOL 7) 0.4 % vaginal cream Place 1 applicator vaginally at bedtime. 05/21/21   Shirlean Mylar, MD  tiZANidine (ZANAFLEX) 2 MG tablet Take 1 tablet (2 mg total) by mouth at bedtime. 07/23/22   Waldon Merl, PA-C  topiramate (TOPAMAX) 25 MG tablet Take 1 tablet (25 mg total) by mouth at bedtime. 10/29/21   Drema Dallas, DO  ZINC SULFATE-VITAMIN C MT Use as directed in the mouth or throat.    [provider]  zolpidem (AMBIEN) 10 MG tablet Take 10 mg by mouth at bedtime as needed. 07/19/20   [provider]     Family History  Problem Relation Age of Onset   Hypertension Mother    Hypercholesterolemia Mother    Colon polyps Maternal Grandmother    Colon cancer Neg Hx    Esophageal cancer Neg Hx    Stomach cancer Neg Hx    Rectal cancer Neg Hx     Social History   Socioeconomic History   Marital status: Single    Spouse name: Not on file   Number of children: 1   Years of education: Not on file   Highest education level: Not on file  Occupational History   Occupation: not working  Tobacco Use   Smoking status:  Never   Smokeless tobacco: Never  Vaping Use   Vaping Use: Never used  Substance and Sexual Activity   Alcohol use: Yes    Comment: holidays   Drug use: No   Sexual activity: Yes    Birth control/protection: Condom  Other Topics Concern   Not on file  Social History Narrative   Right handed   Lives in two story home with son.   Social Determinants of Health   Financial Resource Strain: Not on file  Food Insecurity: Not on file  Transportation Needs: Not on file  Physical Activity: Not on file  Stress: Not  on file  Social Connections: Not on file     Vital Signs: There were no vitals taken for this visit.  No physical examination was performed in lieu of virtual telephone clinic visit.  Imaging: US thyroid 01/11/22  4.4 x 2.1 x 3.1 cm = 15 cc   US thyroid 05/17/22  3.8 x 2.1 x 1.7 cm = 7.1 cc    US Thyroid 08/27/22  3.2 x 2.1 x 1.3 cm = 4.4 cc  Labs: CBC 05/21/21 WBC 6.0, Hb 13, Hct 41.7, Plt 332   Coags None available   TFTs 07/23/21 --> 05/21/22 Serum TSH 0.65 --> 1.34 Serum free T4 0.61 --> 0.9 Serum T3 not obtained --> 111 Thyroperoxidase Antibody not obtained Thyroglobulin Antibody not obtained Calcitonin not obtained  Prior Thyroid FNA: 12/07/21 - left thyroid nodule, [place] II (per Care Everywhere, [facility])   Reports of other prior left thyroid nodule FNA which were also benign   Assessment and Plan: 47 year old female with history of benign, symptomatic 4.4 (15 cc) left thyroid nodule now status post thyroid nodule radiofrequency ablation on 04/08/22.   She is recovering well with significant decrease in neck fullness.  4 month follow up ultrasound demonstrates 70% volume reduction.   Plan for 6 month thyroid ultrasound with clinic visit shortly thereafter.   Electronically Signed: Bennie Dallas 09/03/2022, 2:06 PM   I spent a total of 25 Minutes in virtual telephone clinical consultation, greater than 50% of which was  counseling/coordinating care for symptomatic thyroid nodule.

## 2022-09-09 ENCOUNTER — Other Ambulatory Visit (INDEPENDENT_AMBULATORY_CARE_PROVIDER_SITE_OTHER): Payer: PRIVATE HEALTH INSURANCE

## 2022-09-09 DIAGNOSIS — E041 Nontoxic single thyroid nodule: Secondary | ICD-10-CM

## 2022-09-09 DIAGNOSIS — E221 Hyperprolactinemia: Secondary | ICD-10-CM

## 2022-09-09 LAB — T4, FREE: Free T4: 0.76 ng/dL (ref 0.60–1.60)

## 2022-09-09 LAB — TSH: TSH: 1.22 u[IU]/mL (ref 0.35–5.50)

## 2022-09-10 LAB — PROLACTIN: Prolactin: 7.6 ng/mL (ref 4.8–33.4)

## 2022-09-10 NOTE — Progress Notes (Unsigned)
Patient ID: Kristin Daniels, female   DOB: 08-23-1975, 47 y.o.   MRN: [id number]            Chief complaint: Endocrinology follow-up  History of Present Illness   PROBLEM 1:  Prolactinoma In 2009 she had presented to her physician with complaints of milky discharge from her breasts and headaches She had not had any late or missed menstrual cycles at that time She reportedly was diagnosed to have a prolactinoma and started on cabergoline She does not know what her baseline prolactin level was  She had been followed by an endocrinologist in [place] state with periodic lab work and MRIs and continued on cabergoline Only a few reports are available from previous records and not clear if her prolactin has been consistently controlled In 12/16 her prolactin was 45 About 6 months prior to her visit in December 2018 with her PCP she was told to start taking 1/2 tablet only once a week with her Dostinex  RECENT history: In late 2018 started having some headaches and also breast fullness and mild galactorrhea When her prolactin level was 29 she was told to increase her dosage to half tablet twice a week With relatively higher prolactin levels she is usually has symptoms of her breasts swelling, feeling full and tender.  Also would have a milky discharge on expression but no change in menstrual cycles.  On her visit in 6/22 for a couple of weeks she had missed an occasional dose of her cabergoline and prolactin was high normal at 22.7 without any change in symptoms  On her visit in 2/24 she had complained of breast tenderness and heaviness along with the occasional breast discharge with manual expression, she thinks this is bothersome and wanted symptomatic treatment This was despite her prolactin of 17 She is now taking 1 mg twice a week of the CABERGOLINE  She has taken her cabergoline regularly Now with taking a higher dose of cabergoline she has no mastodynia or breast discharge  However  she saying that the cabergoline is causing her to have headaches and muscle aches although these are not new symptoms  Report of MRI ordered by neurologist on 6/23: Unchanged 8 x 6 mm sellar nodule, 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  Prolactin is now 7  Prolactin levels:  Lab Results  Component Value Date   PROLACTIN 7.6 09/09/2022   PROLACTIN 17.2 06/10/2022   PROLACTIN 13.6 01/30/2022   PROLACTIN 17.5 07/23/2021   Previous MRI of pituitary gland was done by neurologist in the 05/2019 showed an 8 mm hyperintense mass in the posterior sella.  No comparison was made with prior MRI as below   Past history:  During her pregnancy when she was not on regular medication she developed severe diabetes insipidus  which was controlled with her restarting treatment and she took initially bromocriptine and subsequently cabergoline during her pregnancy   PROBLEM 2:  Multinodular goiter.  She had this diagnosed on routine exam several years ago  No prior ultrasound reports are available when [place] was [place] out of [place] and no description of her exam is available from previous notes.  However baseline size of the nodule on the left was 2.8 cm in 2013 Records have shown that she had needle aspiration biopsies of the left dominant nodule 3 times, see below.  Biopsies have been benign consistently including Afirma testing  Thyroid nodule measured 4.4 cm in 04/2020 compared to 3.7 cm as of 2018  The needle aspiration cytology showed scant but normal follicular epithelium  Since she was concerned about the appearance of her neck swelling from the nodule she was treated with radiofrequency ablation by the interventional radiologist on 04/08/2022  Previously in 11/21 her left-sided nodule was about 3-3.5 cm on exam Ultrasound in 01/2022 showed nodule to be 4.4 cm  With the ablation her nodule has progressively improved in size and now measuring only 3.2 cm but  showing 70% reduction in volume  THYROID functions: She does complain of fatigue and cold intolerance which can continue unchanged  Her TSH levels have been consistently normal in the last year or so Although free T4 was low normal in 2/24 it is now improved at 0.76   Lab Results  Component Value Date   TSH 1.22 09/09/2022   TSH 1.58 06/10/2022   TSH 1.34 05/21/2022   FREET4 0.76 09/09/2022   FREET4 0.62 06/10/2022   FREET4 0.9 05/21/2022   Lab Results  Component Value Date   T3FREE 2.5 10/23/2020   T3FREE 3.0 03/13/2020   T3FREE 3.6 09/08/2018     Previous studies:   Her first biopsy was done in 09/2011 which had shown indeterminate cytology and this was confirmed to be benign on Affirma testing At that time her nodule was 2.8 cm  Needle aspiration done in 01/2015 indicated she had a 3 cm nodule that previously had been benign; this showed scanty cellular specimen along with colloid and degenerated macrophages In 08/2016 the needle aspiration biopsy showed a benign follicular adenoma and the nodule size was indicated at 3.7 cm    Allergies as of 09/11/2022       Reactions   Metronidazole Anaphylaxis, Hives   Pollen Extract    Diflucan [fluconazole] Rash   Broke out in a rash with hives around her mouth, hands, face.        Medication List        Accurate as of Sep 11, 2022 10:52 AM. If you have any questions, ask your nurse or doctor.          ALPRAZolam 1 MG tablet Commonly known as: XANAX Take 1 mg by mouth daily.   ARIPiprazole 30 MG tablet Commonly known as: ABILIFY Take 30 mg by mouth daily.   cabergoline 0.5 MG tablet Commonly known as: DOSTINEX 1 pill twice a week   cetirizine 10 MG tablet Commonly known as: ZYRTEC Take 1 tablet (10 mg total) by mouth daily.   clindamycin 1 % external solution Commonly known as: Cleocin-T Apply topically 2 (two) times daily as needed.   cyclobenzaprine 5 MG tablet Commonly known as: FLEXERIL Take 1  tablet (5 mg total) by mouth 3 (three) times daily as needed for muscle spasms.   FIBER PO Take by mouth.   FISH OIL PO Take by mouth.   fluticasone 50 MCG/ACT nasal spray Commonly known as: FLONASE Place 2 sprays into both nostrils daily.   gabapentin 100 MG capsule Commonly known as: Neurontin Take 2 capsules (200 mg total) by mouth 3 (three) times daily.   Ibrexafungerp Citrate 150 MG Tabs Take 2 tablets by mouth 2 (two) times daily.   MAGNESIUM PO Take by mouth.   meloxicam 15 MG tablet Commonly known as: MOBIC Take 1 tablet (15 mg total) by mouth daily.   multivitamin tablet Take 1 tablet by mouth daily.   naproxen sodium 550 MG tablet Commonly known as: Anaprox DS Take 1 tablet (550 mg total) by mouth 2 (two)  times daily as needed.   nitrofurantoin (macrocrystal-monohydrate) 100 MG capsule Commonly known as: MACROBID Take 1 capsule (100 mg total) by mouth 2 (two) times daily.   nortriptyline 50 MG capsule Commonly known as: PAMELOR TAKE 2 CAPSULES (100 MG TOTAL) BY MOUTH AT BEDTIME.   nystatin 100000 UNIT/ML suspension Commonly known as: MYCOSTATIN Take 5 mLs (500,000 Units total) by mouth 4 (four) times daily.   nystatin-triamcinolone ointment Commonly known as: MYCOLOG Apply 1 Application topically 2 (two) times daily.   Olopatadine HCl 0.2 % Soln Apply 1 drop to eye daily.   omeprazole 40 MG capsule Commonly known as: PRILOSEC TAKE 1 CAPSULE (40 MG TOTAL) BY MOUTH DAILY.   Riboflavin-Magnesium-Feverfew 100-90-25 MG Tabs Take 100 mg by mouth daily.   rizatriptan 10 MG tablet Commonly known as: Maxalt Take 1 tablet (10 mg total) by mouth as needed for migraine. May repeat after 2 hours if needed.  Maximum 2 tablets in 24 hours.   terconazole 0.4 % vaginal cream Commonly known as: TERAZOL 7 Place 1 applicator vaginally at bedtime.   tiZANidine 2 MG tablet Commonly known as: ZANAFLEX Take 1 tablet (2 mg total) by mouth at bedtime.    topiramate 25 MG tablet Commonly known as: TOPAMAX Take 1 tablet (25 mg total) by mouth at bedtime.   Trulance 3 MG Tabs Generic drug: Plecanatide Take 1 tablet by mouth daily.   ZINC SULFATE-VITAMIN C MT Use as directed in the mouth or throat.   zolpidem 10 MG tablet Commonly known as: AMBIEN Take 10 mg by mouth at bedtime as needed.        Allergies:  Allergies  Allergen Reactions   Metronidazole Anaphylaxis and Hives   Pollen Extract    Diflucan [Fluconazole] Rash    Broke out in a rash with hives around her mouth, hands, face.    Past Medical History:  Diagnosis Date   Acid reflux    Colon polyp    H. pylori infection    History of seasonal allergies    Hypercholesterolemia    Migraines    Pituitary tumor    Thyroid nodule    Vaginal Pap smear, abnormal     Past Surgical History:  Procedure Laterality Date   CESAREAN SECTION     IR RADIOLOGIST EVAL & MGMT  01/11/2022   IR RADIOLOGIST EVAL & MGMT  05/23/2022   IR RADIOLOGIST EVAL & MGMT  09/03/2022   OTHER SURGICAL HISTORY     hemangio removed at child and had something added to head to help indentation    Family History  Problem Relation Age of Onset   Hypertension Mother    Hypercholesterolemia Mother    Colon polyps Maternal Grandmother    Colon cancer Neg Hx    Esophageal cancer Neg Hx    Stomach cancer Neg Hx    Rectal cancer Neg Hx     Social History:  reports that she has never smoked. She has never used smokeless tobacco. She reports current alcohol use. She reports that she does not use drugs.   Review of Systems  EXAM:  BP 122/80 (BP Location: Right Arm, Patient Position: Sitting, Cuff Size: Normal)   Pulse 90   Ht 5\' 2"  (1.575 m)   Wt 174 lb 9.6 oz (79.2 kg)   SpO2 99%   BMI 31.93 kg/m   Physical Exam   Left thyroid nodule is about 1.5-2 cm in size, smooth, firm, nontender No other nodules felt in the thyroid, right  lobe not palpable   ASSESSMENT:     Prolactinoma since  2009 with history of sellar mass that was heterogenous and 7 mm in original size and presenting with headaches and galactorrhea.   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 biopsy  She has had good results of the radiofrequency ablation of her dominant thyroid nodule on the left and this appears to be 70% smaller in volume She is asking about a nodule on the right side but reassured that there is no abnormality on the right lobe  Again explained to her that she is not hypothyroid based on her recent labs and her fatigue is unrelated and needs to discuss with PCP along with discussion of headaches with her neurologist    PLAN:     She can try to reduce her cabergoline back to 1 mg alternating with 0.5 mg to see if her headaches and fatigue improve as long she does not have recurrence of mastodynia No further evaluation of thyroid nodule needs to be done  Total visit time for evaluation and management of above problems and counseling = 30 minutes  Reather Littler 09/11/22

## 2022-09-11 ENCOUNTER — Encounter: Payer: Self-pay | Admitting: Endocrinology

## 2022-09-11 ENCOUNTER — Ambulatory Visit (INDEPENDENT_AMBULATORY_CARE_PROVIDER_SITE_OTHER): Payer: PRIVATE HEALTH INSURANCE | Admitting: Endocrinology

## 2022-09-11 VITALS — BP 122/80 | HR 90 | Ht 62.0 in | Wt 174.6 lb

## 2022-09-11 DIAGNOSIS — D352 Benign neoplasm of pituitary gland: Secondary | ICD-10-CM | POA: Diagnosis not present

## 2022-09-11 DIAGNOSIS — E041 Nontoxic single thyroid nodule: Secondary | ICD-10-CM | POA: Diagnosis not present

## 2022-09-17 ENCOUNTER — Ambulatory Visit: Payer: BC Managed Care – PPO

## 2022-09-27 ENCOUNTER — Telehealth: Payer: BC Managed Care – PPO | Admitting: Physician Assistant

## 2022-09-27 DIAGNOSIS — B3731 Acute candidiasis of vulva and vagina: Secondary | ICD-10-CM

## 2022-09-27 MED ORDER — FLUCONAZOLE 150 MG PO TABS
150.0000 mg | ORAL_TABLET | ORAL | 0 refills | Status: DC | PRN
Start: 2022-09-27 — End: 2023-01-20

## 2022-09-27 MED ORDER — TERCONAZOLE 0.4 % VA CREA
1.0000 | TOPICAL_CREAM | Freq: Every day | VAGINAL | 0 refills | Status: DC
Start: 2022-09-27 — End: 2023-01-22

## 2022-09-27 NOTE — Addendum Note (Signed)
Addended by: Margaretann Loveless on: 09/27/2022 02:48 PM   Modules accepted: Orders

## 2022-09-27 NOTE — Progress Notes (Signed)

## 2022-10-04 ENCOUNTER — Other Ambulatory Visit: Payer: Self-pay

## 2022-10-04 ENCOUNTER — Ambulatory Visit (INDEPENDENT_AMBULATORY_CARE_PROVIDER_SITE_OTHER): Payer: BC Managed Care – PPO

## 2022-10-04 ENCOUNTER — Other Ambulatory Visit (HOSPITAL_COMMUNITY)
Admission: RE | Admit: 2022-10-04 | Discharge: 2022-10-04 | Disposition: A | Discharge status: Home / Self Care | Payer: BC Managed Care – PPO | Source: Ambulatory Visit | Attending: Family Medicine | Admitting: Family Medicine

## 2022-10-04 VITALS — BP 124/70 | HR 86 | Wt 173.0 lb

## 2022-10-04 DIAGNOSIS — N898 Other specified noninflammatory disorders of vagina: Secondary | ICD-10-CM

## 2022-10-04 NOTE — Progress Notes (Signed)
Kristin Daniels is here with concern of vaginal itching. These symptoms have been present for 2 weeks. Patient reports she has tried the following to resolve symptoms: single Diflucan tablet 09/28/22.  Self swab instructions given and specimen obtained. Explained patient will be contacted with any abnormal results.  Marjo Bicker, RN 10/04/2022  10:48 AM

## 2022-10-07 ENCOUNTER — Encounter: Payer: Self-pay | Admitting: Family Medicine

## 2022-10-07 ENCOUNTER — Ambulatory Visit (INDEPENDENT_AMBULATORY_CARE_PROVIDER_SITE_OTHER): Payer: BC Managed Care – PPO | Admitting: Family Medicine

## 2022-10-07 VITALS — BP 115/68 | HR 79 | Ht 62.0 in | Wt 175.8 lb

## 2022-10-07 DIAGNOSIS — R300 Dysuria: Secondary | ICD-10-CM | POA: Diagnosis not present

## 2022-10-07 DIAGNOSIS — N3000 Acute cystitis without hematuria: Secondary | ICD-10-CM

## 2022-10-07 DIAGNOSIS — R350 Frequency of micturition: Secondary | ICD-10-CM | POA: Diagnosis not present

## 2022-10-07 LAB — POCT URINALYSIS DIP (MANUAL ENTRY)
Bilirubin, UA: NEGATIVE
Glucose, UA: NEGATIVE mg/dL
Ketones, POC UA: NEGATIVE mg/dL
Leukocytes, UA: NEGATIVE
Nitrite, UA: NEGATIVE
Protein Ur, POC: NEGATIVE mg/dL
Spec Grav, UA: 1.01 (ref 1.010–1.025)
Urobilinogen, UA: 0.2 E.U./dL
pH, UA: 7 (ref 5.0–8.0)

## 2022-10-07 LAB — CERVICOVAGINAL ANCILLARY ONLY
Bacterial Vaginitis (gardnerella): NEGATIVE
Candida Glabrata: NEGATIVE
Candida Vaginitis: NEGATIVE
Comment: NEGATIVE
Comment: NEGATIVE
Comment: NEGATIVE

## 2022-10-07 MED ORDER — CEPHALEXIN 500 MG PO CAPS
500.0000 mg | ORAL_CAPSULE | Freq: Two times a day (BID) | ORAL | 0 refills | Status: AC
Start: 2022-10-07 — End: 2022-10-12

## 2022-10-07 NOTE — Patient Instructions (Signed)
It was wonderful to see you today. Thank you for allowing me to be a part of your care. Below is a short summary of what we discussed at your visit today:  UTI symptoms Your symptoms certainly sound like a UTI.  Your urine sample today on dipstick did not show anything but a little bit of blood.  We will go ahead and treat you like you have a urine infection.  Take the Keflex antibiotic twice a day for 5 days.  I have also sent the urine off for culture at the lab.  It usually takes 3 to 4 days to come back, but if you need different antibiotic I will let you know.   Please bring all of your medications to every appointment! If you have any questions or concerns, please do not hesitate to contact us via phone or MyChart message.   Fayette Pho, MD

## 2022-10-07 NOTE — Assessment & Plan Note (Signed)
UA with small amount of blood only.  No leukocytes or nitrites.  Per patient, last UTI had same UA but with positive urine culture.  Will treat empirically with Keflex 500 mg twice daily x 5 days.  Will send urine for culture, follow results.

## 2022-10-07 NOTE — Progress Notes (Signed)
   SUBJECTIVE:   CHIEF COMPLAINT / HPI:   UTI symptoms Couple weeks duration Symptoms include external pruritus, burning with urination, low belly pressure Was seen by OBGYN 10/04/22 Rx diflucan, which resolved external pruritus Dysuria and low abdominal pressure persist No fevers or chills LMP 5/26  PERTINENT  PMH / PSH:  Patient Active Problem List   Diagnosis Date Noted   Other fatigue 03/15/2022   Dysuria 03/15/2022   Benign schwannoma 01/03/2022   Dyspareunia in female 01/03/2022   Bilateral leg pain 05/21/2021   Costochondral chest pain 12/01/2020   Abdominal wall fluid collections 12/01/2020   Seasonal allergic rhinitis due to pollen 08/28/2020   Acne 07/26/2020   Physically well but worried 07/26/2020   Chronic idiopathic constipation 12/29/2017   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   History of pituitary tumor 04/21/2017   History of thyroid nodule 04/21/2017   Enlarged thyroid gland 03/14/2017   Fibrocystic breast changes, bilateral 03/14/2017   Benign tumor of pituitary gland (HCC) 03/14/2017   Uterine leiomyoma 03/14/2017    OBJECTIVE:   BP 115/68   Pulse 79   Ht 5\' 2"  (1.575 m)   Wt 175 lb 12.8 oz (79.7 kg)   LMP 09/29/2022 (Exact Date)   SpO2 100%   BMI 32.15 kg/m    PHQ-9:     03/14/2022    9:54 AM 08/01/2020   11:17 AM 10/21/2019    4:27 PM  Depression screen PHQ 2/9  Decreased Interest 1 0 0  Down, Depressed, Hopeless 0 1 0  PHQ - 2 Score 1 1 0  Altered sleeping 1 1   Tired, decreased energy 1 1   Change in appetite 0 1   Feeling bad or failure about yourself  0 0   Trouble concentrating 0 0   Moving slowly or fidgety/restless 1 0   Suicidal thoughts 0 0   PHQ-9 Score 4 4    Physical Exam General: Awake, alert, oriented Cardiovascular: Regular rate and rhythm, S1 and S2 present, no murmurs auscultated Respiratory: Lung fields clear to auscultation bilaterally MSK: No CVA tenderness bilaterally Abd: soft, non-tender,  non-distended  ASSESSMENT/PLAN:   Dysuria UA with small amount of blood only.  No leukocytes or nitrites.  Per patient, last UTI had same UA but with positive urine culture.  Will treat empirically with Keflex 500 mg twice daily x 5 days.  Will send urine for culture, follow results.     Fayette Pho, MD [facility]

## 2022-10-09 ENCOUNTER — Ambulatory Visit
Admission: RE | Admit: 2022-10-09 | Discharge: 2022-10-09 | Disposition: A | Discharge status: Home / Self Care | Payer: PRIVATE HEALTH INSURANCE | Source: Ambulatory Visit | Attending: Neurology | Admitting: Neurology

## 2022-10-09 DIAGNOSIS — D352 Benign neoplasm of pituitary gland: Secondary | ICD-10-CM

## 2022-10-09 LAB — URINE CULTURE

## 2022-10-09 MED ORDER — GADOPICLENOL 0.5 MMOL/ML IV SOLN
8.0000 mL | Freq: Once | INTRAVENOUS | Status: AC | PRN
  Administered 2022-10-09: 8 mL via INTRAVENOUS

## 2022-10-13 ENCOUNTER — Encounter: Payer: Self-pay | Admitting: Family Medicine

## 2022-10-14 ENCOUNTER — Other Ambulatory Visit: Payer: Self-pay | Admitting: Family Medicine

## 2022-10-14 DIAGNOSIS — R8290 Unspecified abnormal findings in urine: Secondary | ICD-10-CM

## 2022-10-17 ENCOUNTER — Encounter: Payer: Self-pay | Admitting: Family Medicine

## 2022-10-17 DIAGNOSIS — K219 Gastro-esophageal reflux disease without esophagitis: Secondary | ICD-10-CM

## 2022-10-17 MED ORDER — OMEPRAZOLE 40 MG PO CPDR: 40.0000 mg | DELAYED_RELEASE_CAPSULE | Freq: Every day | ORAL | 0 refills | Status: DC

## 2022-12-08 ENCOUNTER — Encounter (HOSPITAL_BASED_OUTPATIENT_CLINIC_OR_DEPARTMENT_OTHER): Payer: Self-pay | Admitting: Orthopaedic Surgery

## 2022-12-08 ENCOUNTER — Encounter: Payer: Self-pay | Admitting: Student

## 2022-12-08 DIAGNOSIS — M25569 Pain in unspecified knee: Secondary | ICD-10-CM

## 2022-12-09 ENCOUNTER — Ambulatory Visit: Payer: PRIVATE HEALTH INSURANCE | Admitting: Student

## 2022-12-09 NOTE — Telephone Encounter (Signed)
Spoke with patient.   She has been in contact with Dr. Steward Drone. She reports he does not require a referral, however she would line one placed to his office anyway "just to be safe."   Will forward to PCP.

## 2022-12-09 NOTE — Progress Notes (Deleted)
  SUBJECTIVE:   CHIEF COMPLAINT / HPI:   Knee Pain Kristin Daniels is a 47 y.o. female who sustained a {gen laterality:315002} knee injury *** {gen duration:315003} ago. Mechanism of injury: ***. Immediate symptoms: {sx:315767}. Symptoms have been {gen onset/course:315708} since that time. Prior history of related problems: {past sx:315768}.    PERTINENT  PMH / PSH: GERD, allergies, HLD, migraines, h/o pituitary tumor   Patient Care Team: Alfredo Martinez, MD as PCP - General (Family Medicine) Drema Dallas, DO as Consulting Physician (Neurology) OBJECTIVE:  There were no vitals taken for this visit. Physical Exam  General: Alert and oriented in no apparent distress Heart: Regular rate and rhythm with no murmurs appreciated Lungs: CTA bilaterally, no wheezing Abdomen: Bowel sounds present, no abdominal pain Skin: Warm and dry Extremities: No lower extremity edema Knee exam: {knee ZOXW:960454}. ASSESSMENT/PLAN:  There are no diagnoses linked to this encounter. ASSESSMENT: Knee {UJ:811914}  PLAN: {plan:315771} See orders for this visit as documented in the electronic medical record.  No follow-ups on file. Alfredo Martinez, MD 12/09/2022, 9:47 AM PGY-3, [facility] {    This will disappear when note is signed, click to select method of visit    :1}

## 2022-12-09 NOTE — Addendum Note (Signed)
Addended by: Alfredo Martinez on: 12/09/2022 09:01 AM   Modules accepted: Orders

## 2022-12-11 ENCOUNTER — Ambulatory Visit (INDEPENDENT_AMBULATORY_CARE_PROVIDER_SITE_OTHER): Payer: PRIVATE HEALTH INSURANCE | Admitting: Student

## 2022-12-11 ENCOUNTER — Other Ambulatory Visit (HOSPITAL_COMMUNITY)
Admission: RE | Admit: 2022-12-11 | Discharge: 2022-12-11 | Disposition: A | Discharge status: Home / Self Care | Payer: PRIVATE HEALTH INSURANCE | Source: Ambulatory Visit | Attending: Family Medicine | Admitting: Family Medicine

## 2022-12-11 ENCOUNTER — Other Ambulatory Visit: Payer: Self-pay

## 2022-12-11 ENCOUNTER — Encounter: Payer: Self-pay | Admitting: Student

## 2022-12-11 VITALS — BP 109/81 | HR 80 | Ht 62.0 in | Wt 168.4 lb

## 2022-12-11 DIAGNOSIS — R300 Dysuria: Secondary | ICD-10-CM

## 2022-12-11 DIAGNOSIS — Z113 Encounter for screening for infections with a predominantly sexual mode of transmission: Secondary | ICD-10-CM

## 2022-12-11 DIAGNOSIS — N898 Other specified noninflammatory disorders of vagina: Secondary | ICD-10-CM

## 2022-12-11 LAB — POCT UA - MICROSCOPIC ONLY: WBC, Ur, HPF, POC: NONE SEEN (ref 0–5)

## 2022-12-11 LAB — POCT URINALYSIS DIP (MANUAL ENTRY)
Bilirubin, UA: NEGATIVE
Glucose, UA: NEGATIVE mg/dL
Ketones, POC UA: NEGATIVE mg/dL
Leukocytes, UA: NEGATIVE
Nitrite, UA: NEGATIVE — AB
Protein Ur, POC: NEGATIVE mg/dL
Spec Grav, UA: 1.02 (ref 1.010–1.025)
Urobilinogen, UA: 0.2 E.U./dL
pH, UA: 6.5 (ref 5.0–8.0)

## 2022-12-11 MED ORDER — PHENAZOPYRIDINE HCL 100 MG PO TABS
100.0000 mg | ORAL_TABLET | Freq: Three times a day (TID) | ORAL | 0 refills | Status: DC | PRN
Start: 2022-12-11 — End: 2023-06-19

## 2022-12-11 NOTE — Progress Notes (Signed)
    SUBJECTIVE:   CHIEF COMPLAINT / HPI:   Dysuria Has had several visits for the same in recent months.  Having dysuria (does seem more external based on description of "tingling"). No fevers, abdominal pain. No blood in urine, but does describe two white "spots" that she saw floating in her urine during an overnight void yesterday.  Defers pelvic exam today but denies any vulvar changes or vaginal discharge. Denies dyspareunia.  She is requesting referral to urology and / or nephrology. Recent renal labs reviewed and all are WNL.   LMP 7/21-7/28  PERTINENT  PMH / PSH: Prolactinoma   OBJECTIVE:   BP 109/81   Pulse 80   Ht 5\' 2"  (1.575 m)   Wt 168 lb 6.4 oz (76.4 kg)   SpO2 100%   BMI 30.80 kg/m   Physical Exam Vitals reviewed.  Constitutional:      General: She is not in acute distress. Cardiovascular:     Rate and Rhythm: Normal rate and regular rhythm.  Pulmonary:     Effort: Pulmonary effort is normal. No respiratory distress.  Abdominal:     General: There is no distension.     Tenderness: There is no abdominal tenderness.  Musculoskeletal:        General: No swelling or deformity.      ASSESSMENT/PLAN:   Dysuria Recurrent issue with no clear cause/source. UA unremarkable today. Will send for culture per patient request. Doubt infectious etiology. ?painful bladder syndrome.  - Urine culture sent - Patient self-swabbed for GC/Chlamydia, BV, yeast, trich - Patient very intereseted in urology opinion, reasonable enough to place referral given frequent presentations with this same constellation of symptoms and no cause yet identified  - Trial of pyridium for symptom control      J Dorothyann Gibbs, MD [facility]

## 2022-12-11 NOTE — Patient Instructions (Addendum)
Ms. Easterlin,   Your urine microscopy looked normal! There was not an excess amount of blood in it. I think you may have what's called chronic pelvic pain syndrome (formerly known as interstitial cystitis). Let's try some pyridium for your symptoms. I will also follow-up on all the labs we've collected today and call you if something pops positive. No need to go to urology for now.  Eliezer Mccoy, MD

## 2022-12-12 ENCOUNTER — Telehealth: Payer: BC Managed Care – PPO | Admitting: Physician Assistant

## 2022-12-12 ENCOUNTER — Encounter: Payer: Self-pay | Admitting: Student

## 2022-12-12 DIAGNOSIS — R300 Dysuria: Secondary | ICD-10-CM

## 2022-12-12 MED ORDER — CEPHALEXIN 500 MG PO CAPS: 500.0000 mg | ORAL_CAPSULE | Freq: Two times a day (BID) | ORAL | 0 refills | Status: AC

## 2022-12-12 NOTE — Patient Instructions (Signed)
Kristin Daniels, thank you for joining Piedad Climes, PA-C for today's virtual visit.  While this provider is not your primary care provider (PCP), if your PCP is located in our provider database this encounter information will be shared with them immediately following your visit.   A [facility] MyChart account gives you access to today's visit and all your visits, tests, and labs performed at [facility] " click here if you don't have a [facility] MyChart account or go to [email or web address]  Consent: (Patient) Kristin Daniels provided verbal consent for this virtual visit at the beginning of the encounter.  Current Medications:  Current Outpatient Medications:    ALPRAZolam (XANAX) 1 MG tablet, Take 1 mg by mouth daily., Disp: , Rfl:    ARIPiprazole (ABILIFY) 30 MG tablet, Take 30 mg by mouth daily., Disp: , Rfl:    cabergoline (DOSTINEX) 0.5 MG tablet, 1 pill twice a week, Disp: 24 tablet, Rfl: 1   cetirizine (ZYRTEC) 10 MG tablet, Take 1 tablet (10 mg total) by mouth daily., Disp: 30 tablet, Rfl: 11   clindamycin (CLEOCIN-T) 1 % external solution, Apply topically 2 (two) times daily as needed., Disp: 30 mL, Rfl: 2   cyclobenzaprine (FLEXERIL) 5 MG tablet, Take 1 tablet (5 mg total) by mouth 3 (three) times daily as needed for muscle spasms., Disp: 30 tablet, Rfl: 0   FIBER PO, Take by mouth., Disp: , Rfl:    fluconazole (DIFLUCAN) 150 MG tablet, Take 1 tablet (150 mg total) by mouth every 3 (three) days as needed., Disp: 2 tablet, Rfl: 0   fluticasone (FLONASE) 50 MCG/ACT nasal spray, Place 2 sprays into both nostrils daily., Disp: 16 g, Rfl: 6   gabapentin (NEURONTIN) 100 MG capsule, Take 2 capsules (200 mg total) by mouth 3 (three) times daily., Disp: 180 capsule, Rfl: 0   Ibrexafungerp Citrate 150 MG TABS, Take 2 tablets by mouth 2 (two) times daily. (Patient not taking: Reported on 10/04/2022), Disp: 4 tablet, Rfl: 0   MAGNESIUM PO, Take by mouth., Disp: , Rfl:     meloxicam (MOBIC) 15 MG tablet, Take 1 tablet (15 mg total) by mouth daily., Disp: 15 tablet, Rfl: 0   Multiple Vitamin (MULTIVITAMIN) tablet, Take 1 tablet by mouth daily., Disp: , Rfl:    naproxen sodium (ANAPROX DS) 550 MG tablet, Take 1 tablet (550 mg total) by mouth 2 (two) times daily as needed., Disp: 20 tablet, Rfl: 5   nortriptyline (PAMELOR) 50 MG capsule, TAKE 2 CAPSULES (100 MG TOTAL) BY MOUTH AT BEDTIME., Disp: 180 capsule, Rfl: 2   nystatin (MYCOSTATIN) 100000 UNIT/ML suspension, Take 5 mLs (500,000 Units total) by mouth 4 (four) times daily., Disp: 60 mL, Rfl: 0   nystatin-triamcinolone ointment (MYCOLOG), Apply 1 Application topically 2 (two) times daily. (Patient not taking: Reported on 10/04/2022), Disp: 30 g, Rfl: 0   Olopatadine HCl 0.2 % SOLN, Apply 1 drop to eye daily., Disp: 1 Bottle, Rfl: 0   Omega-3 Fatty Acids (FISH OIL PO), Take by mouth., Disp: , Rfl:    omeprazole (PRILOSEC) 40 MG capsule, Take 1 capsule (40 mg total) by mouth daily., Disp: 90 capsule, Rfl: 0   phenazopyridine (PYRIDIUM) 100 MG tablet, Take 1 tablet (100 mg total) by mouth 3 (three) times daily as needed for pain., Disp: 20 tablet, Rfl: 0   Plecanatide (TRULANCE) 3 MG TABS, Take 1 tablet by mouth daily., Disp: 30 tablet, Rfl: 11   Riboflavin-Magnesium-Feverfew 100-90-25 MG TABS, Take 100 mg by mouth  daily., Disp: 30 tablet, Rfl: 0   rizatriptan (MAXALT) 10 MG tablet, Take 1 tablet (10 mg total) by mouth as needed for migraine. May repeat after 2 hours if needed.  Maximum 2 tablets in 24 hours., Disp: 10 tablet, Rfl: 5   terconazole (TERAZOL 7) 0.4 % vaginal cream, Place 1 applicator vaginally at bedtime. (Patient not taking: Reported on 10/04/2022), Disp: 45 g, Rfl: 0   tiZANidine (ZANAFLEX) 2 MG tablet, Take 1 tablet (2 mg total) by mouth at bedtime., Disp: 15 tablet, Rfl: 0   topiramate (TOPAMAX) 25 MG tablet, Take 1 tablet (25 mg total) by mouth at bedtime., Disp: 30 tablet, Rfl: 5   ZINC  SULFATE-VITAMIN C MT, Use as directed in the mouth or throat., Disp: , Rfl:    zolpidem (AMBIEN) 10 MG tablet, Take 10 mg by mouth at bedtime as needed., Disp: , Rfl:    Medications ordered in this encounter:  No orders of the defined types were placed in this encounter.    *If you need refills on other medications prior to your next appointment, please contact your pharmacy*  Follow-Up: Call back or seek an in-person evaluation if the symptoms worsen or if the condition fails to improve as anticipated.  [facility] [phone]  Other Instructions Please keep hydrated. Take antibiotic as directed. Your provider will call once your urine culture has resulted. If negative, there is no reason to continue antibiotics and you should follow-up regarding the Urology referral.    If you have been instructed to have an in-person evaluation today at a local Urgent Care facility, please use the link below. It will take you to a list of all of our available [facility], including address, phone number and hours of operation. Please do not delay care.  [facility]  If you or a family member do not have a primary care provider, use the link below to schedule a visit and establish care. When you choose a [facility] primary care physician or advanced practice provider, you gain a long-term partner in health. Find a Primary Care Provider  Learn more about [facility]'s in-office and virtual care options:  - Get Care Now

## 2022-12-12 NOTE — Progress Notes (Signed)
Virtual Visit Consent   Kristin Daniels, you are scheduled for a virtual visit with a [facility] provider today. Just as with appointments in the office, your consent must be obtained to participate. Your consent will be active for this visit and any virtual visit you may have with one of our providers in the next 365 days. If you have a MyChart account, a copy of this consent can be sent to you electronically.  As this is a virtual visit, video technology does not allow for your provider to perform a traditional examination. This may limit your provider's ability to fully assess your condition. If your provider identifies any concerns that need to be evaluated in person or the need to arrange testing (such as labs, EKG, etc.), we will make arrangements to do so. Although advances in technology are sophisticated, we cannot ensure that it will always work on either your end or our end. If the connection with a video visit is poor, the visit may have to be switched to a telephone visit. With either a video or telephone visit, we are not always able to ensure that we have a secure connection.  By engaging in this virtual visit, you consent to the provision of healthcare and authorize for your insurance to be billed (if applicable) for the services provided during this visit. Depending on your insurance coverage, you may receive a charge related to this service.  I need to obtain your verbal consent now. Are you willing to proceed with your visit today? Kristin Daniels has provided verbal consent on 12/12/2022 for a virtual visit (video or telephone). Piedad Climes, [place]  Date: 12/12/2022 6:11 PM  Virtual Visit via Video Note   I, Piedad Climes, connected with  Kristin Daniels  ([id number], [birth year]) on 12/12/22 at  6:00 PM EDT by a video-enabled telemedicine application and verified that I am speaking with the correct person using two identifiers.  Location: Patient: Virtual Visit Location  Patient: Home Provider: Virtual Visit Location Provider: Home Office   I discussed the limitations of evaluation and management by telemedicine and the availability of in person appointments. The patient expressed understanding and agreed to proceed.    History of Present Illness: Kristin Daniels is a 47 y.o. who identifies as a female who was assigned female at birth, and is being seen today for possible UTI. Endorses dysuria, urgency, frequency, suprapubic pressure and lower back pain. Was evaluated at PCP office yesterday for this, which has been a recurring issue, with overall unremarkable UA and microscopy. Urine culture and vaginal swabs still pending. PCP with concern of possible IC as cause instead of infectious cystitis so referral to Urology has been placed. States she knows she has a UTI and is wondering if something can be started to help her while awaiting culture results.   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   Medications:  Current Outpatient Medications:    cephALEXin (KEFLEX) 500 MG capsule, Take 1 capsule  (500 mg total) by mouth 2 (two) times daily for 7 days., Disp: 14 capsule, Rfl: 0   ALPRAZolam (XANAX) 1 MG tablet, Take 1 mg by mouth daily., Disp: , Rfl:    ARIPiprazole (ABILIFY) 30 MG tablet, Take 30 mg by mouth daily., Disp: , Rfl:    cabergoline (DOSTINEX) 0.5 MG tablet, 1 pill twice a week, Disp: 24 tablet, Rfl: 1   cetirizine (ZYRTEC) 10 MG tablet, Take 1 tablet (10 mg total) by mouth daily., Disp: 30 tablet, Rfl: 11   clindamycin (CLEOCIN-T) 1 % external solution, Apply topically 2 (two) times daily as needed., Disp: 30 mL, Rfl: 2   cyclobenzaprine (FLEXERIL) 5 MG tablet, Take 1 tablet (5 mg total) by mouth 3 (three) times daily as needed for muscle spasms., Disp: 30 tablet, Rfl: 0   FIBER PO, Take by mouth., Disp: , Rfl:    fluconazole (DIFLUCAN) 150 MG tablet, Take 1 tablet (150 mg total) by mouth every 3 (three) days as needed., Disp: 2 tablet, Rfl: 0   fluticasone (FLONASE) 50 MCG/ACT nasal spray, Place 2 sprays into both nostrils daily., Disp: 16 g, Rfl: 6   gabapentin (NEURONTIN) 100 MG capsule, Take 2 capsules (200 mg total) by mouth 3 (three) times daily., Disp: 180 capsule, Rfl: 0   Ibrexafungerp Citrate 150 MG TABS, Take 2 tablets by mouth 2 (two) times daily. (Patient not taking: Reported on 10/04/2022), Disp: 4 tablet, Rfl: 0   MAGNESIUM PO, Take by mouth., Disp: , Rfl:    meloxicam (MOBIC) 15 MG tablet, Take 1 tablet (15 mg total) by mouth daily., Disp: 15 tablet, Rfl: 0   Multiple Vitamin (MULTIVITAMIN) tablet, Take 1 tablet by mouth daily., Disp: , Rfl:    naproxen sodium (ANAPROX DS) 550 MG tablet, Take 1 tablet (550 mg total) by mouth 2 (two) times daily as needed., Disp: 20 tablet, Rfl: 5   nortriptyline (PAMELOR) 50 MG capsule, TAKE 2 CAPSULES (100 MG TOTAL) BY MOUTH AT BEDTIME., Disp: 180 capsule, Rfl: 2   nystatin (MYCOSTATIN) 100000 UNIT/ML suspension, Take 5 mLs (500,000 Units total) by mouth 4 (four) times daily., Disp: 60 mL, Rfl: 0   Olopatadine HCl 0.2 % SOLN,  Apply 1 drop to eye daily., Disp: 1 Bottle, Rfl: 0   Omega-3 Fatty Acids (FISH OIL PO), Take by mouth., Disp: , Rfl:    omeprazole (PRILOSEC) 40 MG capsule, Take 1 capsule (40 mg total) by mouth daily., Disp: 90 capsule, Rfl: 0   phenazopyridine (PYRIDIUM) 100 MG tablet, Take 1 tablet (100 mg total) by mouth 3 (three) times daily as needed for pain., Disp: 20 tablet, Rfl: 0   Plecanatide (TRULANCE) 3 MG TABS, Take 1 tablet by mouth daily., Disp: 30 tablet, Rfl: 11   Riboflavin-Magnesium-Feverfew 100-90-25 MG TABS, Take 100 mg by mouth daily., Disp: 30 tablet, Rfl: 0   rizatriptan (MAXALT) 10 MG tablet, Take 1 tablet (10 mg total) by mouth as needed for migraine. May repeat after 2 hours if needed.  Maximum 2 tablets in 24 hours., Disp: 10 tablet, Rfl: 5   terconazole (TERAZOL 7) 0.4 % vaginal  cream, Place 1 applicator vaginally at bedtime. (Patient not taking: Reported on 10/04/2022), Disp: 45 g, Rfl: 0   tiZANidine (ZANAFLEX) 2 MG tablet, Take 1 tablet (2 mg total) by mouth at bedtime., Disp: 15 tablet, Rfl: 0   topiramate (TOPAMAX) 25 MG tablet, Take 1 tablet (25 mg total) by mouth at bedtime., Disp: 30 tablet, Rfl: 5   ZINC SULFATE-VITAMIN C MT, Use as directed in the mouth or throat., Disp: , Rfl:    zolpidem (AMBIEN) 10 MG tablet, Take 10 mg by mouth at bedtime as needed., Disp: , Rfl:   Observations/Objective: Patient is well-developed, well-nourished in no acute distress.  Resting comfortably  at home.  Head is normocephalic, atraumatic.  No labored breathing. Speech is clear and coherent with logical content.  Patient is alert and oriented at baseline.   Assessment and Plan: 1. Dysuria - cephALEXin (KEFLEX) 500 MG capsule; Take 1 capsule (500 mg total) by mouth 2 (two) times daily for 7 days.  Dispense: 14 capsule; Refill: 0  Reviewed resulted labs with her again including UA/micro, HIV, RPR that are all unremarkable. Again, urine culture and vaginal ancillary testing are pending.  Discussed with her that I agree that giving recurring nature of symptoms, this very well could be interstitial cystitis and she should follow-up with Urology referral to make sure she is scheduled for an appointment. Giving her concerns and adamancy, will add on Keflex 500 mg twice daily to start until urine culture result is in and she is contacted by PCP.   Follow Up Instructions: I discussed the assessment and treatment plan with the patient. The patient was provided an opportunity to ask questions and all were answered. The patient agreed with the plan and demonstrated an understanding of the instructions.  A copy of instructions were sent to the patient via MyChart unless otherwise noted below.   The patient was advised to call back or seek an in-person evaluation if the symptoms worsen or if the condition fails to improve as anticipated.  Time:  I spent 10 minutes with the patient via telehealth technology discussing the above problems/concerns.    Piedad Climes, PA-C

## 2022-12-13 ENCOUNTER — Ambulatory Visit (INDEPENDENT_AMBULATORY_CARE_PROVIDER_SITE_OTHER): Payer: PRIVATE HEALTH INSURANCE

## 2022-12-13 ENCOUNTER — Ambulatory Visit (INDEPENDENT_AMBULATORY_CARE_PROVIDER_SITE_OTHER): Payer: PRIVATE HEALTH INSURANCE | Admitting: Orthopaedic Surgery

## 2022-12-13 DIAGNOSIS — M25562 Pain in left knee: Secondary | ICD-10-CM

## 2022-12-13 NOTE — Progress Notes (Signed)
Chief Complaint: Left knee pain     History of Present Illness:    Kristin Daniels is a 47 y.o. female presents with left knee pain after an injury where the knee gave out in a valgus type injury.  Since this time 1 week prior she has been having an extremely difficult time placing weight on the medial aspect of the knee.  She has been having swelling with a visible limp.  She has been taking anti-inflammatories and trialing activity restriction as well as over-the-counter bracing with limited relief    Surgical History:   None  PMH/PSH/Family History/Social History/Meds/Allergies:    Past Medical History:  Diagnosis Date   Acid reflux    Colon polyp    Costochondral chest pain 12/01/2020   H. pylori infection    H. pylori infection 03/26/2019   History of seasonal allergies    Hypercholesterolemia    Lumbar radiculopathy, acute 04/10/2021   Lymph node symptom 09/21/2018   Migraines    Oral candidiasis 05/21/2021   Pain of right lower leg 12/01/2020   Pituitary tumor    Thyroid nodule    Vaginal candidiasis 05/21/2021   Vaginal Pap smear, abnormal    Past Surgical History:  Procedure Laterality Date   CESAREAN SECTION     IR RADIOLOGIST EVAL & MGMT  01/11/2022   IR RADIOLOGIST EVAL & MGMT  05/23/2022   IR RADIOLOGIST EVAL & MGMT  09/03/2022   OTHER SURGICAL HISTORY     hemangio removed at child and had something added to head to help indentation   Social History   Socioeconomic History   Marital status: Single    Spouse name: Not on file   Number of children: 1   Years of education: Not on file   Highest education level: Not on file  Occupational History   Occupation: not working  Tobacco Use   Smoking status: Never   Smokeless tobacco: Never  Vaping Use   Vaping status: Never Used  Substance and Sexual Activity   Alcohol use: Yes    Comment: holidays   Drug use: No   Sexual activity: Yes    Birth control/protection:  Condom  Other Topics Concern   Not on file  Social History Narrative   Right handed   Lives in two story home with son.   Social Determinants of Health   Financial Resource Strain: Low Risk  (11/18/2019)   [facility]   Overall Financial Resource Strain (CARDIA)    Difficulty of Paying Living Expenses: Not very hard  Food Insecurity: No Food Insecurity (11/18/2019)   [facility]   Hunger Vital Sign    Worried About Running Out of Food in the Last Year: Never true    Ran Out of Food in the Last Year: Never true  Transportation Needs: No Transportation Needs (11/18/2019)   [facility], [facility]   PRAPARE - Transportation    Lack of Transportation (Medical): No    Lack of Transportation (Non-Medical): No  Physical Activity: Inactive (11/18/2019)   [facility] from [facility], [facility]   Exercise Vital Sign    Days of Exercise per Week: 0 days    Minutes of Exercise per Session: 0 min  Stress: Not on file  Social  Connections: Not on file   Family History  Problem Relation Age of Onset   Hypertension Mother    Hypercholesterolemia Mother    Colon polyps Maternal Grandmother    Colon cancer Neg Hx    Esophageal cancer Neg Hx    Stomach cancer Neg Hx    Rectal cancer Neg Hx    Allergies  Allergen Reactions   Metronidazole Anaphylaxis and Hives   Pollen Extract    Diflucan [Fluconazole] Rash    Broke out in a rash with hives around her mouth, hands, face.   Current Outpatient Medications  Medication Sig Dispense Refill   ALPRAZolam (XANAX) 1 MG tablet Take 1 mg by mouth daily.     ARIPiprazole (ABILIFY) 30 MG tablet Take 30 mg by mouth daily.     cabergoline (DOSTINEX) 0.5 MG tablet 1 pill twice a week 24 tablet 1   cephALEXin (KEFLEX) 500 MG capsule Take 1 capsule (500 mg total) by mouth 2 (two) times  daily for 7 days. 14 capsule 0   cetirizine (ZYRTEC) 10 MG tablet Take 1 tablet (10 mg total) by mouth daily. 30 tablet 11   clindamycin (CLEOCIN-T) 1 % external solution Apply topically 2 (two) times daily as needed. 30 mL 2   cyclobenzaprine (FLEXERIL) 5 MG tablet Take 1 tablet (5 mg total) by mouth 3 (three) times daily as needed for muscle spasms. 30 tablet 0   FIBER PO Take by mouth.     fluconazole (DIFLUCAN) 150 MG tablet Take 1 tablet (150 mg total) by mouth every 3 (three) days as needed. 2 tablet 0   fluticasone (FLONASE) 50 MCG/ACT nasal spray Place 2 sprays into both nostrils daily. 16 g 6   gabapentin (NEURONTIN) 100 MG capsule Take 2 capsules (200 mg total) by mouth 3 (three) times daily. 180 capsule 0   Ibrexafungerp Citrate 150 MG TABS Take 2 tablets by mouth 2 (two) times daily. (Patient not taking: Reported on 10/04/2022) 4 tablet 0   MAGNESIUM PO Take by mouth.     meloxicam (MOBIC) 15 MG tablet Take 1 tablet (15 mg total) by mouth daily. 15 tablet 0   Multiple Vitamin (MULTIVITAMIN) tablet Take 1 tablet by mouth daily.     naproxen sodium (ANAPROX DS) 550 MG tablet Take 1 tablet (550 mg total) by mouth 2 (two) times daily as needed. 20 tablet 5   nortriptyline (PAMELOR) 50 MG capsule TAKE 2 CAPSULES (100 MG TOTAL) BY MOUTH AT BEDTIME. 180 capsule 2   nystatin (MYCOSTATIN) 100000 UNIT/ML suspension Take 5 mLs (500,000 Units total) by mouth 4 (four) times daily. 60 mL 0   Olopatadine HCl 0.2 % SOLN Apply 1 drop to eye daily. 1 Bottle 0   Omega-3 Fatty Acids (FISH OIL PO) Take by mouth.     omeprazole (PRILOSEC) 40 MG capsule Take 1 capsule (40 mg total) by mouth daily. 90 capsule 0   phenazopyridine (PYRIDIUM) 100 MG tablet Take 1 tablet (100 mg total) by mouth 3 (three) times daily as needed for pain. 20 tablet 0   Plecanatide (TRULANCE) 3 MG TABS Take 1 tablet by mouth daily. 30 tablet 11   Riboflavin-Magnesium-Feverfew 100-90-25 MG TABS Take 100 mg by mouth daily. 30 tablet 0    rizatriptan (MAXALT) 10 MG tablet Take 1 tablet (10 mg total) by mouth as needed for migraine. May repeat after 2 hours if needed.  Maximum 2 tablets in 24 hours. 10 tablet 5   terconazole (TERAZOL 7) 0.4 % vaginal  cream Place 1 applicator vaginally at bedtime. (Patient not taking: Reported on 10/04/2022) 45 g 0   tiZANidine (ZANAFLEX) 2 MG tablet Take 1 tablet (2 mg total) by mouth at bedtime. 15 tablet 0   topiramate (TOPAMAX) 25 MG tablet Take 1 tablet (25 mg total) by mouth at bedtime. 30 tablet 5   ZINC SULFATE-VITAMIN C MT Use as directed in the mouth or throat.     zolpidem (AMBIEN) 10 MG tablet Take 10 mg by mouth at bedtime as needed.     No current facility-administered medications for this visit.   No results found.  Review of Systems:   A ROS was performed including pertinent positives and negatives as documented in the HPI.  Physical Exam :   Constitutional: NAD and appears stated age Neurological: Alert and oriented Psych: Appropriate affect and cooperative There were no vitals taken for this visit.   Comprehensive Musculoskeletal Exam:    Tenderness to palpation about left medial joint line with positive McMurray.  Positive effusion.  Range of motion is from 0 to 120 degrees.  No crepitus.  Negative Lachman, no valgus or varus laxity  Imaging:   Xray (4 view left knee): Normal    I personally reviewed and interpreted the radiographs.   Assessment:   47 y.o. female with left knee pain consistent with a possible medial meniscal root injury.  At today's visit given the acuity of the injury I have recommended an MRI of the left knee to further assess and rule out any type of underlying meniscal root injury.  Plan :    -Plan for MRI and follow-up     I personally saw and evaluated the patient, and participated in the management and treatment plan.  Huel Cote, MD Attending Physician, Orthopedic Surgery  This document was dictated using Dragon voice  recognition software. A reasonable attempt at proof reading has been made to minimize errors.

## 2022-12-13 NOTE — Assessment & Plan Note (Addendum)
Recurrent issue with no clear cause/source. UA unremarkable today. Will send for culture per patient request. Doubt infectious etiology. ?painful bladder syndrome.  - Urine culture sent - Patient self-swabbed for GC/Chlamydia, BV, yeast, trich - Patient very intereseted in urology opinion, reasonable enough to place referral given frequent presentations with this same constellation of symptoms and no cause yet identified  - Trial of pyridium for symptom control

## 2022-12-19 ENCOUNTER — Other Ambulatory Visit (INDEPENDENT_AMBULATORY_CARE_PROVIDER_SITE_OTHER): Payer: BC Managed Care – PPO

## 2022-12-19 DIAGNOSIS — E041 Nontoxic single thyroid nodule: Secondary | ICD-10-CM

## 2022-12-19 DIAGNOSIS — D352 Benign neoplasm of pituitary gland: Secondary | ICD-10-CM

## 2022-12-19 LAB — T4, FREE: Free T4: 0.59 ng/dL — ABNORMAL LOW (ref 0.60–1.60)

## 2022-12-19 LAB — TSH: TSH: 1.16 u[IU]/mL (ref 0.35–5.50)

## 2022-12-21 ENCOUNTER — Encounter: Payer: Self-pay | Admitting: "Endocrinology

## 2022-12-21 LAB — SPECIMEN STATUS REPORT

## 2022-12-23 ENCOUNTER — Other Ambulatory Visit: Payer: Self-pay | Admitting: Endocrinology

## 2022-12-23 ENCOUNTER — Ambulatory Visit: Payer: BC Managed Care – PPO | Admitting: "Endocrinology

## 2022-12-23 ENCOUNTER — Other Ambulatory Visit (INDEPENDENT_AMBULATORY_CARE_PROVIDER_SITE_OTHER): Payer: BC Managed Care – PPO

## 2022-12-23 DIAGNOSIS — E221 Hyperprolactinemia: Secondary | ICD-10-CM

## 2022-12-24 LAB — PROLACTIN: Prolactin: 7.7 ng/mL

## 2022-12-25 ENCOUNTER — Encounter: Payer: Self-pay | Admitting: Endocrinology

## 2022-12-25 ENCOUNTER — Ambulatory Visit (INDEPENDENT_AMBULATORY_CARE_PROVIDER_SITE_OTHER): Payer: BC Managed Care – PPO | Admitting: Endocrinology

## 2022-12-25 VITALS — BP 122/80 | HR 100 | Ht 62.0 in | Wt 169.0 lb

## 2022-12-25 DIAGNOSIS — E041 Nontoxic single thyroid nodule: Secondary | ICD-10-CM

## 2022-12-25 DIAGNOSIS — E221 Hyperprolactinemia: Secondary | ICD-10-CM

## 2022-12-25 DIAGNOSIS — D352 Benign neoplasm of pituitary gland: Secondary | ICD-10-CM

## 2022-12-25 DIAGNOSIS — E042 Nontoxic multinodular goiter: Secondary | ICD-10-CM | POA: Diagnosis not present

## 2022-12-25 NOTE — Progress Notes (Signed)
Outpatient Endocrinology Note Kristin Shelba Susi, MD  12/25/22  Patient's Name: Kristin Daniels    DOB: Jun 16, 1975    MRN: [id number]  REASON OF VISIT: Follow up for prolactinoma/hyperprolactinemia /left thyroid nodule.  PCP: Alfredo Martinez, MD  HISTORY OF PRESENT ILLNESS:   Kristin Daniels is a 47 y.o. old female with past medical history listed below, is here for follow up of pituitary concerns /prolactinoma/hyperprolactinemia/left thyroid nodule.  She was last seen by Dr. Lucianne Muss in Sep 11, 2022.  # Pertinent Pituitary History: - Patient was diagnosed with prolactinoma and hyperprolactinemia in 2009 with original size of pituitary adenoma of 7 mm initially presented with galactorrhea and headache was evaluated in [place], had seen endocrinologist, had periodic lab work, MRI, started on cabergoline and continued.  She moved to this area, in 2016, she was first seen in endocrinology clinic by Dr. Lucianne Muss in March 2019.  Patient does not know her baseline prolactin level prior to starting cabergoline.  Prolactin was 45 in December 2016.  Around 2018 her primary care provider had restarted cabergoline half tablet once a week.,  In late 2018 she had mild galactorrhea and breast fullness cabergoline was increased to half tablet twice a week.  With a relatively higher level of prolactin she usually have symptoms of breast swelling, fullness and tenderness, along with galactorrhea on expression but no change in menstrual cycles.  Historically in June 2022 she had missed couple of weeks of cabergoline however her prolactin was upper normal 22.7 without any change in symptoms.  In visit in February 2024, she had complaints of breast tenderness and heaviness with occasional breast discharge on expression and was bothersome and wanted to have symptomatic treatments.  At that time she was taking cabergoline 1 mg 2 times a week.  Patient was seen by Dr. Lucianne Muss in May 2024, according to the note she was taking cabergoline 1  mg 2 times a week and had advised to take 1 mg alternate with 0.5 mg in a week. There is discrepancy between the doses of the cabergoline mentioned on the note and the patient reported.  Patient reports that she is actually taking cabergoline 1 tablet which is 0.5 mg 2 times a week since her last visit in May, 2024.Before that she was taking 1 tablet which is 0.5 mg alternating with half tablet which is at 0.25 mg in a week.  Pituitary adenoma/prolactinoma is being monitored with MRI, she has been following with neurology. MRI brain in October 09, 2022 : IMPRESSION:  8 x 6 mm sellar lesion, unchanged in size and appearance dating back to the brain MRI of 05/31/2019. 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 Cystic pituitary adenoma not excluded.  Prior MRI in June 2023 and January 2021 with a stable pituitary microadenoma.  During her pregnancy when she was not on regular medications she had developed severe diabetes insipidus which was controlled with her restarting treatment and she took initially bromocriptine and subsequently cabergoline during her pregnancy.  # Left thyroid nodule -She was diagnosed with multiple thyroid nodules several years ago on routine exam. In 2013 she had baseline size of left thyroid nodule was 2.8 cm.  Records showed that Needle Aspiration Biopsies of Left Dominant Thyroid Nodule 3 Times, Biopsy had been Benign consistently Including Afirma Testing.  In December 2021 thyroid nodule was measuring 4.4 cm compared to 3.5 cm in 2018, status post fine-needle aspiration in September 2023 showed scanty but normal follicular epithelium.  She  was concerned with the appearance of her neck swelling from the nodule, she underwent radiofrequency ablation by interventional radiologist on April 08, 2022.  Previously in November 2021 her left-sided nodule was about 3 to 3.5 cm, ultrasound in September 2023 nodule was 4.4 cm.  With the ablation or nodule  progressively improved in size and measuring 3.2 cm swelling 70% reduction in volume as of ultrasound in April 2024.  There is a small cyst on the right thyroid lobe.  She has normal thyroid function test with normal TSH and low normal free T4 mostly.  She has not been on thyroid medication.  Per records: Her first biopsy was done in 09/2011 which had shown indeterminate cytology and this was confirmed to be benign on Affirma testing, at that time her nodule was 2.8 cm.  Needle aspiration done in 01/2015 indicated she had a 3 cm nodule that previously had been benign; this showed scanty cellular specimen along with colloid and degenerated macrophages In 08/2016 the needle aspiration biopsy showed a benign follicular adenoma and the nodule size was indicated at 3.7 cm  Interval history 12/25/22 Patient has been taking cabergoline 0.5 mg 2 times a week Sunday and Thursday.  With the current dose he is not having breast pain or fullness, denies spontaneous galactorrhea.  She at times may get expressive galactorrhea.  Still with headache however no new headache.  No peripheral vision loss.  She reports she has recently seen ophthalmology and had normal peripheral vision.  She reports regular menstrual cycle.  She has some fatigue, fluctuating weight.  No change in bowel habit.  Recent lab with normal prolactin and normal TSH however free T4 is 0.59 /barely low with low normal limit of 0.60.   Latest Reference Range & Units 12/19/22 11:52 12/23/22 10:49  Prolactin ng/mL CANCELED 7.7  TSH 0.35 - 5.50 uIU/mL 1.16   T4,Free(Direct) 0.60 - 1.60 ng/dL 6.29 (L)   (L): Data is abnormally low  REVIEW OF SYSTEMS:  As per history of present illness.   PAST MEDICAL HISTORY: Past Medical History:  Diagnosis Date   Acid reflux    Colon polyp    Costochondral chest pain 12/01/2020   H. pylori infection    H. pylori infection 03/26/2019   History of seasonal allergies    Hypercholesterolemia    Lumbar  radiculopathy, acute 04/10/2021   Lymph node symptom 09/21/2018   Migraines    Oral candidiasis 05/21/2021   Pain of right lower leg 12/01/2020   Pituitary tumor    Thyroid nodule    Vaginal candidiasis 05/21/2021   Vaginal Pap smear, abnormal     PAST SURGICAL HISTORY: Past Surgical History:  Procedure Laterality Date   CESAREAN SECTION     IR RADIOLOGIST EVAL & MGMT  01/11/2022   IR RADIOLOGIST EVAL & MGMT  05/23/2022   IR RADIOLOGIST EVAL & MGMT  09/03/2022   OTHER SURGICAL HISTORY     hemangio removed at child and had something added to head to help indentation    ALLERGIES: Allergies  Allergen Reactions   Metronidazole Anaphylaxis and Hives   Pollen Extract    Diflucan [Fluconazole] Rash    Broke out in a rash with hives around her mouth, hands, face.    FAMILY HISTORY:  Family History  Problem Relation Age of Onset   Hypertension Mother    Hypercholesterolemia Mother    Colon polyps Maternal Grandmother    Colon cancer Neg Hx    Esophageal cancer Neg Hx  Stomach cancer Neg Hx    Rectal cancer Neg Hx     SOCIAL HISTORY: Social History   Socioeconomic History   Marital status: Single    Spouse name: Not on file   Number of children: 1   Years of education: Not on file   Highest education level: Not on file  Occupational History   Occupation: not working  Tobacco Use   Smoking status: Never   Smokeless tobacco: Never  Vaping Use   Vaping status: Never Used  Substance and Sexual Activity   Alcohol use: Yes    Comment: holidays   Drug use: No   Sexual activity: Yes    Birth control/protection: Condom  Other Topics Concern   Not on file  Social History Narrative   Right handed   Lives in two story home with son.   Social Determinants of Health   Financial Resource Strain: Low Risk  (11/18/2019)   [facility]   Overall Financial Resource Strain (CARDIA)    Difficulty of Paying Living Expenses:  Not very hard  Food Insecurity: No Food Insecurity (11/18/2019)   [facility]   Hunger Vital Sign    Worried About Running Out of Food in the Last Year: Never true    Ran Out of Food in the Last Year: Never true  Transportation Needs: No Transportation Needs (11/18/2019)   [facility] from [facility]   PRAPARE - Transportation    Lack of Transportation (Medical): No    Lack of Transportation (Non-Medical): No  Physical Activity: Inactive (11/18/2019)   [facility] from [facility]   Exercise Vital Sign    Days of Exercise per Week: 0 days    Minutes of Exercise per Session: 0 min  Stress: Not on file  Social Connections: Not on file    MEDICATIONS:  Current Outpatient Medications  Medication Sig Dispense Refill   ALPRAZolam (XANAX) 1 MG tablet Take 1 mg by mouth daily.     ARIPiprazole (ABILIFY) 30 MG tablet Take 30 mg by mouth daily.     cabergoline (DOSTINEX) 0.5 MG tablet 1 pill twice a week 24 tablet 1   cetirizine (ZYRTEC) 10 MG tablet Take 1 tablet (10 mg total) by mouth daily. 30 tablet 11   clindamycin (CLEOCIN-T) 1 % external solution Apply topically 2 (two) times daily as needed. 30 mL 2   cyclobenzaprine (FLEXERIL) 5 MG tablet Take 1 tablet (5 mg total) by mouth 3 (three) times daily as needed for muscle spasms. 30 tablet 0   FIBER PO Take by mouth.     fluconazole (DIFLUCAN) 150 MG tablet Take 1 tablet (150 mg total) by mouth every 3 (three) days as needed. 2 tablet 0   fluticasone (FLONASE) 50 MCG/ACT nasal spray Place 2 sprays into both nostrils daily. 16 g 6   Ibrexafungerp Citrate 150 MG TABS Take 2 tablets by mouth 2 (two) times daily. 4 tablet 0   MAGNESIUM PO Take by mouth.     meloxicam (MOBIC) 15 MG tablet Take 1 tablet (15 mg total) by mouth daily. 15 tablet 0   Multiple Vitamin (MULTIVITAMIN) tablet Take 1 tablet by mouth daily.      naproxen sodium (ANAPROX DS) 550 MG tablet Take 1 tablet (550 mg total) by mouth 2 (two) times daily as needed. 20 tablet 5   nortriptyline (PAMELOR) 50 MG capsule TAKE 2 CAPSULES (100 MG TOTAL) BY MOUTH AT BEDTIME. 180  capsule 2   nystatin (MYCOSTATIN) 100000 UNIT/ML suspension Take 5 mLs (500,000 Units total) by mouth 4 (four) times daily. 60 mL 0   Olopatadine HCl 0.2 % SOLN Apply 1 drop to eye daily. 1 Bottle 0   Omega-3 Fatty Acids (FISH OIL PO) Take by mouth.     omeprazole (PRILOSEC) 40 MG capsule Take 1 capsule (40 mg total) by mouth daily. 90 capsule 0   phenazopyridine (PYRIDIUM) 100 MG tablet Take 1 tablet (100 mg total) by mouth 3 (three) times daily as needed for pain. 20 tablet 0   Plecanatide (TRULANCE) 3 MG TABS Take 1 tablet by mouth daily. 30 tablet 11   Riboflavin-Magnesium-Feverfew 100-90-25 MG TABS Take 100 mg by mouth daily. 30 tablet 0   rizatriptan (MAXALT) 10 MG tablet Take 1 tablet (10 mg total) by mouth as needed for migraine. May repeat after 2 hours if needed.  Maximum 2 tablets in 24 hours. 10 tablet 5   terconazole (TERAZOL 7) 0.4 % vaginal cream Place 1 applicator vaginally at bedtime. 45 g 0   tiZANidine (ZANAFLEX) 2 MG tablet Take 1 tablet (2 mg total) by mouth at bedtime. 15 tablet 0   topiramate (TOPAMAX) 25 MG tablet Take 1 tablet (25 mg total) by mouth at bedtime. 30 tablet 5   ZINC SULFATE-VITAMIN C MT Use as directed in the mouth or throat.     zolpidem (AMBIEN) 10 MG tablet Take 10 mg by mouth at bedtime as needed.     gabapentin (NEURONTIN) 100 MG capsule Take 2 capsules (200 mg total) by mouth 3 (three) times daily. 180 capsule 0   No current facility-administered medications for this visit.    PHYSICAL EXAM: Vitals:   12/25/22 1033  BP: 122/80  Pulse: 100  SpO2: 98%  Weight: 169 lb (76.7 kg)  Height: 5\' 2"  (1.575 m)   Body mass index is 30.91 kg/m.   General: Well developed, well nourished female in no apparent distress. No cushingoid and  acromegalic appearance HEENT: AT/, no external lesions. Hearing intact to the spoken word Eyes: EOMI. Conjunctiva clear and no icterus. Visual Field by confrontation grossly intact Neck: Trachea midline, neck supple without appreciable thyromegaly or lymphadenopathy and left palpable thyroid nodule + Lungs: Clear to auscultation, no wheeze. Respirations not labored Heart: S1S2, Regular in rate and rhythm.  Abdomen: Soft, non tender Neurologic: Alert, oriented, normal speech, deep tendon biceps reflexes normal,  no gross focal neurological deficit Extremities: No pedal pitting edema, no tremors of outstretched hands Skin: Warm, color good.  Psychiatric: Does not appear depressed or anxious  PERTINENT HISTORIC LABORATORY AND IMAGING STUDIES:  All pertinent laboratory results were reviewed. Please see HPI also for further details.    ASSESSMENT / PLAN  1. Hyperprolactinemia (HCC)   2. Multiple thyroid nodules   3. Prolactinoma (HCC)   4. Left thyroid nodule   5. 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 Cystic pituitary adenoma not excluded. -Patient has been on cabergoline for several years, seems like it was on and off however restarted in 2018. -She has normal prolactin in last few years. -She gets symptoms of breast discomfort, fullness and possibly galactorrhea on low-dose of cabergoline or higher normal range of prolactin. -She is currently taking cabergoline 0.5 mg / 1 tablet 2 times a week Sunday and Thursday since around May 2024.  There has been discrepancy on the note and patient reported about the dosing of cabergoline as mentioned in HPI.  # Multinodular goiter /left thyroid nodule -Left thyroid nodule measuring more than 4 cm, status post multiple FNAs in the past including Afirma with benign cytology  last FNA was in September 2023.  Status post radiofrequency ablation in December 2023 of this nodule and that has been decreasing the size was measured 3.2 cm with 70% decrease in volume and ultrasound in April 2024.  There is a small cyst in her right thyroid lobe. -Will monitor left thyroid nodule with serial ultrasound. -She is euthyroid and had always normal thyroid function test with normal TSH and usually low normal free T4.  Recent free T4 was mildly low.  Plan: -Continue cabergoline 0.5 mg 2 times a week for now.  Will gradually taper in the future visit. -Repeat thyroid function test TSH, free T4 in 6 weeks. -Lab for prolactin 1 week prior to follow-up visit in 3 months.   Zakiyya was seen today for establish care.  Diagnoses and all orders for this visit:  Hyperprolactinemia (HCC) -     Prolactin; Future  Multiple thyroid nodules -     TSH; Future -     T4, free; Future  Prolactinoma (HCC)  Left thyroid nodule  Multinodular goiter    DISPOSITION Follow up in clinic in 3 months suggested.  All questions answered and patient verbalized understanding of the plan.  Kristin Elhadji Pecore, MD [facility] [address] [place], [place] [address] Phone # [phone]  At least part of this note was generated using voice recognition software. Inadvertent word errors may have occurred, which were not recognized during the proofreading process.

## 2022-12-25 NOTE — Patient Instructions (Signed)
Continue cabergoline 0.5 mg two times a week.  Labs for thyroid in 6 weeks and prolactin 1 week prior to follow up visit.

## 2022-12-30 ENCOUNTER — Telehealth: Payer: Self-pay

## 2022-12-30 ENCOUNTER — Ambulatory Visit (INDEPENDENT_AMBULATORY_CARE_PROVIDER_SITE_OTHER): Payer: BC Managed Care – PPO | Admitting: Family Medicine

## 2022-12-30 ENCOUNTER — Ambulatory Visit (HOSPITAL_COMMUNITY)
Admission: RE | Admit: 2022-12-30 | Discharge: 2022-12-30 | Disposition: A | Discharge status: Home / Self Care | Payer: BC Managed Care – PPO | Source: Ambulatory Visit | Attending: Family Medicine | Admitting: Family Medicine

## 2022-12-30 VITALS — BP 138/88 | HR 102 | Ht 62.0 in | Wt 168.0 lb

## 2022-12-30 DIAGNOSIS — G8929 Other chronic pain: Secondary | ICD-10-CM | POA: Diagnosis not present

## 2022-12-30 DIAGNOSIS — R079 Chest pain, unspecified: Secondary | ICD-10-CM

## 2022-12-30 DIAGNOSIS — R9431 Abnormal electrocardiogram [ECG] [EKG]: Secondary | ICD-10-CM | POA: Insufficient documentation

## 2022-12-30 DIAGNOSIS — Z87440 Personal history of urinary (tract) infections: Secondary | ICD-10-CM

## 2022-12-30 DIAGNOSIS — R0789 Other chest pain: Secondary | ICD-10-CM | POA: Insufficient documentation

## 2022-12-30 LAB — POCT URINALYSIS DIP (MANUAL ENTRY)
Bilirubin, UA: NEGATIVE
Blood, UA: NEGATIVE
Glucose, UA: NEGATIVE mg/dL
Ketones, POC UA: NEGATIVE mg/dL
Leukocytes, UA: NEGATIVE
Nitrite, UA: NEGATIVE
Protein Ur, POC: NEGATIVE mg/dL
Spec Grav, UA: 1.01 (ref 1.010–1.025)
Urobilinogen, UA: 0.2 E.U./dL
pH, UA: 6.5 (ref 5.0–8.0)

## 2022-12-30 NOTE — Assessment & Plan Note (Addendum)
Acute on chronic worsening of chest pain.  Hemodynamically stable in the office.  Exam unremarkable.  EKG largely unchanged from prior and without signs of ischemic change.  Given her history of endocrine abnormalities, she is at higher risk for cardiac disease. Advised she go to the emergency room today due to her active chest pain, though she declined; she stated she would go if her pain worsens.  Will order outpatient echo for further evaluation as well as refer to cardiology for potential stress testing given chronicity of problem. Consider variant angina. Also consider costochondritis given pain with palpation on exam as well as GERD given some improvement with PPI in the past.  Advised to take PPI daily to potentially help control the symptoms in the meantime with tylenol/ibuprofen prn.

## 2022-12-30 NOTE — Patient Instructions (Addendum)
I recommend you go to the emergency room for your chest pain. I will send in a referral to cardiology in the meantime. I will order an ECHO to evaluate your heart. Be sure to take your reflux medicine daily. You can take tylenol and ibuprofen as needed based on the box for any pain in the meantime. I will follow up your urine results with you to see if it is clear after antibiotics.

## 2022-12-30 NOTE — Telephone Encounter (Signed)
Patient calls nurse line requesting to schedule same day appointment for intermittent fatigue, cough, chest pain and pressure.   She reports that these symptoms have been going on since 2022. She thought that these were related to pituitary tumor or acid reflux.   She states that she would like further cardiac evaluation with ECHO.   PCP does not have availability until the middle of September.   She is requesting appointment for this AM. Scheduled in ATC for 10:10.  ED precautions discussed between now and scheduled appointment.   Veronda Prude, RN

## 2022-12-30 NOTE — Progress Notes (Signed)
    SUBJECTIVE:   CHIEF COMPLAINT / HPI:   Chest pain Longstanding history of central chest pain that feels like somebody is sitting on her chest since 2019.  Pain worsened this morning after she awoke from coughing.  It has remained consistent.  Does have reflux and sometimes takes reflux medicines for this which helps a little.  Also thought it could be due to her breasts pulling on her chest from her prolactinoma and prior episodes.  Today, her chest pain has also been associated with bilateral arm pain.  She also states she has bilateral leg pain.  She has felt dizzy.  She has felt short of breath.  She is wondering if her cabergoline for her prolactinoma is causing any valvular issues with her heart.  She does have pain when pressing on the chest wall, this is not the same as the pain she felt this morning and is currently feeling.  Check on urine Was recently treated for a UTI.  She would like to recheck her urine to ensure the antibiotics worked.  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  OBJECTIVE:   BP 138/88   Pulse (!) 102   Ht 5\' 2"  (1.575 m)   Wt 168 lb (76.2 kg)   LMP 12/20/2022   SpO2 98%   BMI 30.73 kg/m   General: Alert and oriented, in NAD Skin: Warm, dry, and intact without lesions HEENT: NCAT, EOM grossly normal, midline nasal septum Cardiac/Chest: RRR, no m/r/g appreciated, mild tenderness to palpation of chest wall diffusely Respiratory: CTAB, breathing and speaking comfortably on RA Abdominal: Soft, diffusely mildly tender, nondistended, normoactive bowel sounds Extremities: Moves all extremities grossly equally Neurological: No gross focal deficit Psychiatric: Anxious mood and affect   ASSESSMENT/PLAN:   Chronic chest pain Acute on chronic worsening of chest pain.  Hemodynamically stable in the office.  Exam unremarkable.  EKG largely unchanged from prior and without signs  of ischemic change.  Given her history of endocrine abnormalities, she is at higher risk for cardiac disease. Advised she go to the emergency room today due to her active chest pain, though she declined; she stated she would go if her pain worsens.  Will order outpatient echo for further evaluation as well as refer to cardiology for potential stress testing given chronicity of problem. Consider variant angina. Also consider costochondritis given pain with palpation on exam as well as GERD given some improvement with PPI in the past.  Advised to take PPI daily to potentially help control the symptoms in the meantime with tylenol/ibuprofen prn.  Urinary frequency Patient continues to have urinary frequency.  Appears she was recently treated for UTI about 2 weeks ago.  Symptoms seem to be recurrent without no clear source per documentation.  Appears she was referred to urology, but I cannot see if an appointment has been made.  Repeat UA today at patient's strong request was negative for acute infection.  Follow-up with urology for further evaluation and with PCP as needed.  Janeal Holmes, MD [facility]

## 2022-12-31 ENCOUNTER — Ambulatory Visit
Admission: RE | Admit: 2022-12-31 | Discharge: 2022-12-31 | Disposition: A | Discharge status: Home / Self Care | Payer: PRIVATE HEALTH INSURANCE | Source: Ambulatory Visit | Attending: Orthopaedic Surgery | Admitting: Orthopaedic Surgery

## 2022-12-31 DIAGNOSIS — M25562 Pain in left knee: Secondary | ICD-10-CM

## 2023-01-08 ENCOUNTER — Encounter: Payer: Self-pay | Admitting: Physical Therapy

## 2023-01-08 ENCOUNTER — Ambulatory Visit (INDEPENDENT_AMBULATORY_CARE_PROVIDER_SITE_OTHER): Payer: PRIVATE HEALTH INSURANCE | Admitting: Orthopaedic Surgery

## 2023-01-08 ENCOUNTER — Ambulatory Visit (INDEPENDENT_AMBULATORY_CARE_PROVIDER_SITE_OTHER): Payer: BC Managed Care – PPO | Admitting: Physical Therapy

## 2023-01-08 DIAGNOSIS — M25562 Pain in left knee: Secondary | ICD-10-CM

## 2023-01-08 DIAGNOSIS — G8929 Other chronic pain: Secondary | ICD-10-CM

## 2023-01-08 NOTE — Progress Notes (Signed)
Chief Complaint: Left knee pain     History of Present Illness:   01/08/2023: Presents today for follow-up of her left knee.  She is here today for MRI review.  Overall the knee is feeling better since her last visit.  Kristin Daniels is a 47 y.o. female presents with left knee pain after an injury where the knee gave out in a valgus type injury.  Since this time 1 week prior she has been having an extremely difficult time placing weight on the medial aspect of the knee.  She has been having swelling with a visible limp.  She has been taking anti-inflammatories and trialing activity restriction as well as over-the-counter bracing with limited relief    Surgical History:   None  PMH/PSH/Family History/Social History/Meds/Allergies:    Past Medical History:  Diagnosis Date   Acid reflux    Colon polyp    Costochondral chest pain 12/01/2020   H. pylori infection    H. pylori infection 03/26/2019   History of seasonal allergies    Hypercholesterolemia    Lumbar radiculopathy, acute 04/10/2021   Lymph node symptom 09/21/2018   Migraines    Oral candidiasis 05/21/2021   Pain of right lower leg 12/01/2020   Pituitary tumor    Thyroid nodule    Vaginal candidiasis 05/21/2021   Vaginal Pap smear, abnormal    Past Surgical History:  Procedure Laterality Date   CESAREAN SECTION     IR RADIOLOGIST EVAL & MGMT  01/11/2022   IR RADIOLOGIST EVAL & MGMT  05/23/2022   IR RADIOLOGIST EVAL & MGMT  09/03/2022   OTHER SURGICAL HISTORY     hemangio removed at child and had something added to head to help indentation   Social History   Socioeconomic History   Marital status: Single    Spouse name: Not on file   Number of children: 1   Years of education: Not on file   Highest education level: Not on file  Occupational History   Occupation: not working  Tobacco Use   Smoking status: Never   Smokeless tobacco: Never  Vaping Use   Vaping status: Never  Used  Substance and Sexual Activity   Alcohol use: Yes    Comment: holidays   Drug use: No   Sexual activity: Yes    Birth control/protection: Condom  Other Topics Concern   Not on file  Social History Narrative   Right handed   Lives in two story home with son.   Social Determinants of Health   Financial Resource Strain: Low Risk  (11/18/2019)   [facility]   Overall Financial Resource Strain (CARDIA)    Difficulty of Paying Living Expenses: Not very hard  Food Insecurity: No Food Insecurity (11/18/2019)   [facility]   Hunger Vital Sign    Worried About Running Out of Food in the Last Year: Never true    Ran Out of Food in the Last Year: Never true  Transportation Needs: No Transportation Needs (11/18/2019)   [facility] from [facility], [facility]   PRAPARE - Transportation    Lack of Transportation (Medical): No    Lack of Transportation (Non-Medical): No  Physical Activity: Inactive (11/18/2019)   Received from [facility], [facility]   Exercise Vital  Sign    Days of Exercise per Week: 0 days    Minutes of Exercise per Session: 0 min  Stress: Not on file  Social Connections: Not on file   Family History  Problem Relation Age of Onset   Hypertension Mother    Hypercholesterolemia Mother    Colon polyps Maternal Grandmother    Colon cancer Neg Hx    Esophageal cancer Neg Hx    Stomach cancer Neg Hx    Rectal cancer Neg Hx    Allergies  Allergen Reactions   Metronidazole Anaphylaxis and Hives   Pollen Extract    Diflucan [Fluconazole] Rash    Broke out in a rash with hives around her mouth, hands, face.   Current Outpatient Medications  Medication Sig Dispense Refill   ALPRAZolam (XANAX) 1 MG tablet Take 1 mg by mouth daily.     ARIPiprazole (ABILIFY) 30 MG tablet Take 30 mg by mouth daily.     cabergoline  (DOSTINEX) 0.5 MG tablet 1 pill twice a week 24 tablet 1   cetirizine (ZYRTEC) 10 MG tablet Take 1 tablet (10 mg total) by mouth daily. 30 tablet 11   clindamycin (CLEOCIN-T) 1 % external solution Apply topically 2 (two) times daily as needed. 30 mL 2   cyclobenzaprine (FLEXERIL) 5 MG tablet Take 1 tablet (5 mg total) by mouth 3 (three) times daily as needed for muscle spasms. 30 tablet 0   FIBER PO Take by mouth.     fluconazole (DIFLUCAN) 150 MG tablet Take 1 tablet (150 mg total) by mouth every 3 (three) days as needed. 2 tablet 0   fluticasone (FLONASE) 50 MCG/ACT nasal spray Place 2 sprays into both nostrils daily. 16 g 6   gabapentin (NEURONTIN) 100 MG capsule Take 2 capsules (200 mg total) by mouth 3 (three) times daily. 180 capsule 0   Ibrexafungerp Citrate 150 MG TABS Take 2 tablets by mouth 2 (two) times daily. 4 tablet 0   MAGNESIUM PO Take by mouth.     meloxicam (MOBIC) 15 MG tablet Take 1 tablet (15 mg total) by mouth daily. 15 tablet 0   Multiple Vitamin (MULTIVITAMIN) tablet Take 1 tablet by mouth daily.     naproxen sodium (ANAPROX DS) 550 MG tablet Take 1 tablet (550 mg total) by mouth 2 (two) times daily as needed. 20 tablet 5   nortriptyline (PAMELOR) 50 MG capsule TAKE 2 CAPSULES (100 MG TOTAL) BY MOUTH AT BEDTIME. 180 capsule 2   nystatin (MYCOSTATIN) 100000 UNIT/ML suspension Take 5 mLs (500,000 Units total) by mouth 4 (four) times daily. 60 mL 0   Olopatadine HCl 0.2 % SOLN Apply 1 drop to eye daily. 1 Bottle 0   Omega-3 Fatty Acids (FISH OIL PO) Take by mouth.     omeprazole (PRILOSEC) 40 MG capsule Take 1 capsule (40 mg total) by mouth daily. 90 capsule 0   phenazopyridine (PYRIDIUM) 100 MG tablet Take 1 tablet (100 mg total) by mouth 3 (three) times daily as needed for pain. 20 tablet 0   Plecanatide (TRULANCE) 3 MG TABS Take 1 tablet by mouth daily. 30 tablet 11   Riboflavin-Magnesium-Feverfew 100-90-25 MG TABS Take 100 mg by mouth daily. 30 tablet 0   rizatriptan  (MAXALT) 10 MG tablet Take 1 tablet (10 mg total) by mouth as needed for migraine. May repeat after 2 hours if needed.  Maximum 2 tablets in 24 hours. 10 tablet 5   terconazole (TERAZOL 7) 0.4 % vaginal cream Place 1  applicator vaginally at bedtime. 45 g 0   tiZANidine (ZANAFLEX) 2 MG tablet Take 1 tablet (2 mg total) by mouth at bedtime. 15 tablet 0   topiramate (TOPAMAX) 25 MG tablet Take 1 tablet (25 mg total) by mouth at bedtime. 30 tablet 5   ZINC SULFATE-VITAMIN C MT Use as directed in the mouth or throat.     zolpidem (AMBIEN) 10 MG tablet Take 10 mg by mouth at bedtime as needed.     No current facility-administered medications for this visit.   No results found.  Review of Systems:   A ROS was performed including pertinent positives and negatives as documented in the HPI.  Physical Exam :   Constitutional: NAD and appears stated age Neurological: Alert and oriented Psych: Appropriate affect and cooperative Last menstrual period 12/20/2022.   Comprehensive Musculoskeletal Exam:    Tenderness to palpation about left medial patellofemoral joint.  Positive effusion.  Range of motion is from 0 to 120 degrees.  No crepitus.  Negative Lachman, no valgus or varus laxity  Imaging:   Xray (4 view left knee): Normal  MRI left knee: Significant chondral loss involving the medial lateral patellar facet I personally reviewed and interpreted the radiographs.   Assessment:   47 y.o. female with left knee in the setting of chondral loss on the undersurface of the patella.  He did discuss treatment options including a patellar centering sleeve.  I would like her to engage in physical therapy for a good hip and quad strengthening program.  I did also recommend topical anti-inflammatory gel which she will consider.  Will consider ultrasound-guided injection in the future as needed  Plan :    -She will follow-up as needed     I personally saw and evaluated the patient, and participated  in the management and treatment plan.  Huel Cote, MD Attending Physician, Orthopedic Surgery  This document was dictated using Dragon voice recognition software. A reasonable attempt at proof reading has been made to minimize errors.

## 2023-01-08 NOTE — Therapy (Signed)
  OUTPATIENT PHYSICAL THERAPY SCREEN @[address]   Patient Name: Kristin Daniels MRN: [id number] DOB:07-25-1975, 47 y.o., female Today's Date: 01/08/2023  END OF SESSION:  PT End of Session - 01/08/23 1120     Visit Number 1    Activity Tolerance Patient tolerated treatment well    Behavior During Therapy [facility] for tasks assessed/performed             Past Medical History:  Diagnosis Date   Acid reflux    Colon polyp    Costochondral chest pain 12/01/2020   H. pylori infection    H. pylori infection 03/26/2019   History of seasonal allergies    Hypercholesterolemia    Lumbar radiculopathy, acute 04/10/2021   Lymph node symptom 09/21/2018   Migraines    Oral candidiasis 05/21/2021   Pain of right lower leg 12/01/2020   Pituitary tumor    Thyroid nodule    Vaginal candidiasis 05/21/2021   Vaginal Pap smear, abnormal    Past Surgical History:  Procedure Laterality Date   CESAREAN SECTION     IR RADIOLOGIST EVAL & MGMT  01/11/2022   IR RADIOLOGIST EVAL & MGMT  05/23/2022   IR RADIOLOGIST EVAL & MGMT  09/03/2022   OTHER SURGICAL HISTORY     hemangio removed at child and had something added to head to help indentation   Patient Active Problem List   Diagnosis Date Noted   Chronic chest pain 12/30/2022   Other fatigue 03/15/2022   Dysuria 03/15/2022   Benign schwannoma 01/03/2022   Bilateral leg pain 05/21/2021   Abdominal wall fluid collections 12/01/2020   Seasonal allergic rhinitis due to pollen 08/28/2020   Acne 07/26/2020   Physically well but worried 07/26/2020   Chronic idiopathic constipation 12/29/2017   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   [employer] HEP code:  [id number] [email or web address]  Clinical Impression & Plan:  Discussed returning to water walking for decreased exercise load. Will send Rx to [address]- she states that is closer to her. Lumbar musculature activation in sidelying as compensation for hip abd weakess. Discussed reducing lean on SPC.    Army Fossa PT, DPT 01/08/2023, 11:20 AM  [address] [place], [place] [address] [phone]   Note: charges not applied for screen.

## 2023-01-14 ENCOUNTER — Other Ambulatory Visit: Payer: BC Managed Care – PPO

## 2023-01-17 ENCOUNTER — Ambulatory Visit: Payer: BC Managed Care – PPO | Admitting: "Endocrinology

## 2023-01-18 ENCOUNTER — Other Ambulatory Visit: Payer: Self-pay | Admitting: Student

## 2023-01-18 DIAGNOSIS — J301 Allergic rhinitis due to pollen: Secondary | ICD-10-CM

## 2023-01-20 ENCOUNTER — Other Ambulatory Visit: Payer: Self-pay | Admitting: Student

## 2023-01-20 ENCOUNTER — Encounter: Payer: Self-pay | Admitting: Student

## 2023-01-20 ENCOUNTER — Ambulatory Visit (INDEPENDENT_AMBULATORY_CARE_PROVIDER_SITE_OTHER): Payer: PRIVATE HEALTH INSURANCE | Admitting: Student

## 2023-01-20 VITALS — BP 113/64 | HR 80 | Ht 62.0 in | Wt 167.2 lb

## 2023-01-20 DIAGNOSIS — K219 Gastro-esophageal reflux disease without esophagitis: Secondary | ICD-10-CM

## 2023-01-20 DIAGNOSIS — R109 Unspecified abdominal pain: Secondary | ICD-10-CM

## 2023-01-20 DIAGNOSIS — L709 Acne, unspecified: Secondary | ICD-10-CM

## 2023-01-20 DIAGNOSIS — R350 Frequency of micturition: Secondary | ICD-10-CM

## 2023-01-20 DIAGNOSIS — J301 Allergic rhinitis due to pollen: Secondary | ICD-10-CM | POA: Diagnosis not present

## 2023-01-20 LAB — POCT UA - MICROSCOPIC ONLY: WBC, Ur, HPF, POC: NONE SEEN (ref 0–5)

## 2023-01-20 LAB — POCT URINALYSIS DIP (MANUAL ENTRY)
Bilirubin, UA: NEGATIVE
Glucose, UA: NEGATIVE mg/dL
Ketones, POC UA: NEGATIVE mg/dL
Leukocytes, UA: NEGATIVE
Nitrite, UA: NEGATIVE
Protein Ur, POC: NEGATIVE mg/dL
Spec Grav, UA: 1.02 (ref 1.010–1.025)
Urobilinogen, UA: 0.2 U/dL
pH, UA: 6.5 (ref 5.0–8.0)

## 2023-01-20 MED ORDER — CETIRIZINE HCL 10 MG PO TABS: 10.0000 mg | ORAL_TABLET | Freq: Every day | ORAL | 3 refills | Status: AC

## 2023-01-20 MED ORDER — DESONIDE 0.05 % EX CREA
TOPICAL_CREAM | Freq: Two times a day (BID) | CUTANEOUS | 0 refills | Status: AC
Start: 2023-01-20 — End: ?

## 2023-01-20 MED ORDER — OMEPRAZOLE 40 MG PO CPDR
40.0000 mg | DELAYED_RELEASE_CAPSULE | Freq: Every day | ORAL | 3 refills | Status: AC
Start: 2023-01-20 — End: ?

## 2023-01-20 MED ORDER — CLINDAMYCIN PHOS-BENZOYL PEROX 1-5 % EX GEL
Freq: Two times a day (BID) | CUTANEOUS | 0 refills | Status: AC
Start: 2023-01-20 — End: ?

## 2023-01-20 NOTE — Patient Instructions (Addendum)
It was great to see you today! Thank you for choosing [facility] for your primary care.  Today we addressed: --We will obtain blood work today  --I have ordered your Clindamycin-Benzoyl peroxide For Benzaclin, it is typically applied twice daily, morning and evening, to the affected areas after washing and drying the skin Avoid contact with eyes, mouth, mucous membranes, and areas of broken skin, as the product may be irritating Minimize or avoid exposure to natural or artificial sunlight (e.g., tanning beds). Use protective clothing and a sunscreen with SPF 15 or higher if exposure cannot be avoided --I have ordered your Desonide as well--do not use for more than four weeks at a time on irritated areas    If you haven't already, sign up for My Chart to have easy access to your labs results, and communication with your primary care physician. We are checking some labs today. If they are abnormal, I will call you. If they are normal, I will send you a MyChart message (if it is active) or a letter in the mail. If you do not hear about your labs in the next 2 weeks, please call the office. I recommend that you always bring your medications to each appointment as this makes it easy to ensure you are on the correct medications and helps [place] not miss refills when you need them. Call the clinic at [phone] if your symptoms worsen or you have any concerns. Return in about 2 months (around 03/22/2023) for Abdominal Pain, Acne . Please arrive 15 minutes before your appointment to ensure smooth check in process.  We appreciate your efforts in making this happen.  Thank you for allowing me to participate in your care, Kristin Martinez, MD 01/20/2023, 2:52 PM PGY-3, [facility]

## 2023-01-20 NOTE — Progress Notes (Unsigned)
  SUBJECTIVE:   CHIEF COMPLAINT / HPI:   Presenting for Bloodwork:  Wants to have kidneys + liver checked.   Abdominal Pain:  -Has h pylori history, was treated for this  -Reports of upper abdominal pain -Ongoing for quite some time  -Not related to food intake  -Has had EGD and colonoscopy per patient but only able to see colonoscopy (nml) in imaging  -No vomiting, hematochezia, hematemesis -Normal PO intake  -Reports that the prilosec is helpful   Urinary Symptoms -Tickling sensation and feeling like she has to pee more frequently  -Used to have pain in the back but not anymore  -No hematuria  -No fever or chills  -Requesting UA  Skin -Notes that she has used a cream through the "ordinary" brand  -this caused an exacerbation of her acne and worsened inflammation of her skin  -Asking for refill of Desonide and Acne medication combo Clindamycin and Benzoyl Peroxide   PERTINENT  PMH / PSH: Seasonal allergies  Benign tumor of pituitary gland  Acne  Uterine leiomyoma Benign schwannoma  H/o Thyroid nodule    OBJECTIVE:  BP 113/64   Pulse 80   Ht 5\' 2"  (1.575 m)   Wt 167 lb 3.2 oz (75.8 kg)   LMP 01/15/2023   SpO2 100%   BMI 30.58 kg/m  Physical Exam  General: Alert and oriented in no apparent distress Heart: Regular rate and rhythm with no murmurs appreciated Lungs: CTA bilaterally, no wheezing Abdomen: Bowel sounds present, Mild tenderness to palpation of epigastrium, nondistended, soft BS present  Skin: Warm and dry, small papules on the skin of face, make up overlying and difficult to assess degree of inflammation  Extremities: No lower extremity edema  ASSESSMENT/PLAN:   Assessment & Plan Gastroesophageal reflux disease without esophagitis Per previous note, work up for chest pain has been initiated. Feel it is reasonable to obtain H pylori testing but patient has to be off of PPI prior to this x2 weeks, placed future lab order for this. No acute abdomen or  red flags at present, fluctuating weight in the realm of normal. CMP, CBC, Lipase ordered. Acne, unspecified acne type Has used Clindamycin in the past, ordered Duo medication Benzaclin and discussed side effects. Also ordered her home eczema and inflammatory steroid lotion and discussed only short term use as it causes hypopigmentation, patient verbalized understanding.  Seasonal allergic rhinitis due to pollen Zyrtec ordered on request  Urinary frequency UA on request     Return in about 2 months (around 03/22/2023) for Abdominal Pain, Acne . Alfredo Martinez, MD 01/22/2023, 1:25 PM PGY-3, [facility]

## 2023-01-21 LAB — COMPREHENSIVE METABOLIC PANEL
ALT: 15 IU/L (ref 0–32)
AST: 16 IU/L (ref 0–40)
Albumin: 4.3 g/dL (ref 3.9–4.9)
Alkaline Phosphatase: 75 IU/L (ref 44–121)
BUN/Creatinine Ratio: 21 (ref 9–23)
BUN: 13 mg/dL (ref 6–24)
Bilirubin Total: 0.2 mg/dL (ref 0.0–1.2)
CO2: 23 mmol/L (ref 20–29)
Calcium: 9.4 mg/dL (ref 8.7–10.2)
Chloride: 105 mmol/L (ref 96–106)
Creatinine, Ser: 0.63 mg/dL (ref 0.57–1.00)
Globulin, Total: 2.5 g/dL (ref 1.5–4.5)
Glucose: 83 mg/dL (ref 70–99)
Potassium: 4.8 mmol/L (ref 3.5–5.2)
Sodium: 141 mmol/L (ref 134–144)
Total Protein: 6.8 g/dL (ref 6.0–8.5)
eGFR: 110 mL/min/{1.73_m2} (ref >59)

## 2023-01-21 LAB — CBC WITH DIFFERENTIAL/PLATELET
Basophils Absolute: 0 10*3/uL (ref 0.0–0.2)
Basos: 1 %
EOS (ABSOLUTE): 0.1 10*3/uL (ref 0.0–0.4)
Eos: 1 %
Hematocrit: 37.5 % (ref 34.0–46.6)
Hemoglobin: 12 g/dL (ref 11.1–15.9)
Immature Grans (Abs): 0 10*3/uL (ref 0.0–0.1)
Immature Granulocytes: 0 %
Lymphocytes Absolute: 2.1 10*3/uL (ref 0.7–3.1)
Lymphs: 37 %
MCH: 27.3 pg (ref 26.6–33.0)
MCHC: 32 g/dL (ref 31.5–35.7)
MCV: 85 fL (ref 79–97)
Monocytes Absolute: 0.5 10*3/uL (ref 0.1–0.9)
Monocytes: 9 %
Neutrophils Absolute: 3 10*3/uL (ref 1.4–7.0)
Neutrophils: 52 %
Platelets: 317 10*3/uL (ref 150–450)
RBC: 4.4 x10E6/uL (ref 3.77–5.28)
RDW: 13.2 % (ref 11.7–15.4)
WBC: 5.8 10*3/uL (ref 3.4–10.8)

## 2023-01-21 LAB — HEMOGLOBIN A1C
Est. average glucose Bld gHb Est-mCnc: 120 mg/dL
Hgb A1c MFr Bld: 5.8 % — ABNORMAL HIGH (ref 4.8–5.6)

## 2023-01-21 LAB — LIPASE: Lipase: 29 U/L (ref 14–72)

## 2023-01-22 DIAGNOSIS — K219 Gastro-esophageal reflux disease without esophagitis: Secondary | ICD-10-CM | POA: Insufficient documentation

## 2023-01-22 MED ORDER — DESONIDE 0.05 % EX OINT: 1.0000 | TOPICAL_OINTMENT | Freq: Two times a day (BID) | CUTANEOUS | 0 refills | Status: AC

## 2023-01-22 NOTE — Assessment & Plan Note (Signed)
Zyrtec ordered on request

## 2023-01-22 NOTE — Assessment & Plan Note (Signed)
UA on request

## 2023-01-22 NOTE — Assessment & Plan Note (Signed)
Has used Clindamycin in the past, ordered Duo medication Benzaclin and discussed side effects. Also ordered her home eczema and inflammatory steroid lotion and discussed only short term use as it causes hypopigmentation, patient verbalized understanding.

## 2023-01-22 NOTE — Assessment & Plan Note (Signed)
Per previous note, work up for chest pain has been initiated. Feel it is reasonable to obtain H pylori testing but patient has to be off of PPI prior to this x2 weeks, placed future lab order for this. No acute abdomen or red flags at present, fluctuating weight in the realm of normal. CMP, CBC, Lipase ordered.

## 2023-01-24 ENCOUNTER — Encounter: Payer: Self-pay | Admitting: Student

## 2023-01-24 DIAGNOSIS — K219 Gastro-esophageal reflux disease without esophagitis: Secondary | ICD-10-CM

## 2023-01-30 MED ORDER — CICLOPIROX 8 % EX SOLN: Freq: Every day | CUTANEOUS | 0 refills | Status: AC

## 2023-02-03 ENCOUNTER — Other Ambulatory Visit: Payer: BC Managed Care – PPO

## 2023-02-03 DIAGNOSIS — K219 Gastro-esophageal reflux disease without esophagitis: Secondary | ICD-10-CM

## 2023-02-05 ENCOUNTER — Encounter: Payer: Self-pay | Admitting: Student

## 2023-02-05 ENCOUNTER — Other Ambulatory Visit (INDEPENDENT_AMBULATORY_CARE_PROVIDER_SITE_OTHER): Payer: BC Managed Care – PPO

## 2023-02-05 DIAGNOSIS — E221 Hyperprolactinemia: Secondary | ICD-10-CM

## 2023-02-05 DIAGNOSIS — E042 Nontoxic multinodular goiter: Secondary | ICD-10-CM | POA: Diagnosis not present

## 2023-02-05 LAB — TSH: TSH: 1.03 u[IU]/mL (ref 0.35–5.50)

## 2023-02-05 LAB — T4, FREE: Free T4: 0.57 ng/dL — ABNORMAL LOW (ref 0.60–1.60)

## 2023-02-05 LAB — H. PYLORI BREATH TEST: H pylori Breath Test: NEGATIVE

## 2023-02-06 LAB — PROLACTIN: Prolactin: 11.9 ng/mL

## 2023-02-10 ENCOUNTER — Other Ambulatory Visit: Payer: Self-pay | Admitting: Interventional Radiology

## 2023-02-10 DIAGNOSIS — E041 Nontoxic single thyroid nodule: Secondary | ICD-10-CM

## 2023-02-12 ENCOUNTER — Other Ambulatory Visit: Payer: Self-pay | Admitting: Interventional Radiology

## 2023-02-12 DIAGNOSIS — E041 Nontoxic single thyroid nodule: Secondary | ICD-10-CM

## 2023-02-13 ENCOUNTER — Encounter: Payer: Self-pay | Admitting: Endocrinology

## 2023-02-21 ENCOUNTER — Ambulatory Visit
Admission: RE | Admit: 2023-02-21 | Discharge: 2023-02-21 | Disposition: A | Discharge status: Home / Self Care | Payer: BC Managed Care – PPO | Source: Ambulatory Visit | Attending: Interventional Radiology

## 2023-02-21 DIAGNOSIS — E041 Nontoxic single thyroid nodule: Secondary | ICD-10-CM

## 2023-02-26 ENCOUNTER — Ambulatory Visit (HOSPITAL_COMMUNITY)
Admission: RE | Admit: 2023-02-26 | Discharge: 2023-02-26 | Disposition: A | Discharge status: Home / Self Care | Payer: MEDICAID | Source: Ambulatory Visit | Attending: Family Medicine | Admitting: Family Medicine

## 2023-02-26 DIAGNOSIS — R079 Chest pain, unspecified: Secondary | ICD-10-CM | POA: Diagnosis not present

## 2023-02-26 DIAGNOSIS — G8929 Other chronic pain: Secondary | ICD-10-CM | POA: Insufficient documentation

## 2023-02-26 DIAGNOSIS — I3481 Nonrheumatic mitral (valve) annulus calcification: Secondary | ICD-10-CM | POA: Diagnosis not present

## 2023-02-26 DIAGNOSIS — I371 Nonrheumatic pulmonary valve insufficiency: Secondary | ICD-10-CM | POA: Diagnosis not present

## 2023-02-26 LAB — ECHOCARDIOGRAM COMPLETE
Area-P 1/2: 3.31 cm2
Calc EF: 57.2 %
S' Lateral: 2.8 cm
Single Plane A2C EF: 54.4 %
Single Plane A4C EF: 59.1 %

## 2023-02-27 ENCOUNTER — Encounter: Payer: Self-pay | Admitting: Endocrinology

## 2023-02-27 ENCOUNTER — Encounter: Payer: Self-pay | Admitting: Family Medicine

## 2023-02-27 NOTE — Telephone Encounter (Signed)
I reviewed the echocardiogram.  You do not have any heart valves problem.  That means you can continue to take the cabergoline.  No need to change.

## 2023-03-04 ENCOUNTER — Other Ambulatory Visit: Payer: Self-pay | Admitting: Student

## 2023-03-04 DIAGNOSIS — Z1231 Encounter for screening mammogram for malignant neoplasm of breast: Secondary | ICD-10-CM

## 2023-03-10 MED ORDER — CLINDAMYCIN PHOSPHATE 1 % EX SOLN: Freq: Two times a day (BID) | CUTANEOUS | 2 refills | Status: AC | PRN

## 2023-03-11 ENCOUNTER — Other Ambulatory Visit: Payer: Self-pay | Admitting: Endocrinology

## 2023-03-11 DIAGNOSIS — E221 Hyperprolactinemia: Secondary | ICD-10-CM

## 2023-03-16 NOTE — Progress Notes (Signed)
Reason for follow up:  Patient was seen in virtual telephone clinic visit today for symptomatic thyroid nodule, 11 months status post thyroid radiofrequency ablation   Referring Physician(s): Reather Littler  History of present illness: Initial Consult HPI: Kristin Daniels is a 47 y.o. female with a symptomatic left thyroid nodule.  This was first diagnosed in 2004 and at that time was approximately 2 cm.  She has remained euthyroid throughout her life.  The nodule has slowly enlarged, now measuring over 4 cm.  She underwent FNA in [place] last month which resulted negative.  She endorses new pain that is occasionally associated with her nodule.  She occasionally has a rhaspy voice, which is new.  She denies dysphagia.  She states that the nodule has gradually become readily noticeable and palpable.   She recently saw an ENT in [facility] for the nodule who recommended hemithyroidectomy.  She found out about thyroid RFA online and was graciously referred to me by Dr. Lucianne Muss for evaluation.  We met in consultation 01/11/22 and discussed the risks and benefits of the procedure and she was agreeable to proceed. An additional thyroid nodule FNA was performed 01/23/22 with pathology negative for malignancy. She underwent thermal ablation of the left thyroid nodule 04/08/22 and tolerated the procedure well. She has followed up with me several times since the procedure and has consistently reported doing well. There has been a significant decrease in neck fullness and the nodule is no longer visible. A 60-month ultrasound demonstrated a 70% volume reduction.   She had a repeat ultrasound performed 02/21/23 and she presents today via virtual tele-health visit to discuss the results.   Past Medical History:  Diagnosis Date   Acid reflux    Colon polyp    Costochondral chest pain 12/01/2020   H. pylori infection    H. pylori infection 03/26/2019   History of seasonal allergies    Hypercholesterolemia    Lumbar  radiculopathy, acute 04/10/2021   Lymph node symptom 09/21/2018   Migraines    Oral candidiasis 05/21/2021   Pain of right lower leg 12/01/2020   Pituitary tumor    Thyroid nodule    Vaginal candidiasis 05/21/2021   Vaginal Pap smear, abnormal     Past Surgical History:  Procedure Laterality Date   CESAREAN SECTION     IR RADIOLOGIST EVAL & MGMT  01/11/2022   IR RADIOLOGIST EVAL & MGMT  05/23/2022   IR RADIOLOGIST EVAL & MGMT  09/03/2022   OTHER SURGICAL HISTORY     hemangio removed at child and had something added to head to help indentation    Allergies: Metronidazole, Pollen extract, and Diflucan [fluconazole]  Medications: Prior to Admission medications   Medication Sig Start Date End Date Taking? Authorizing Provider  ALPRAZolam Prudy Feeler) 1 MG tablet Take 1 mg by mouth daily. 06/28/20   [provider]  ARIPiprazole (ABILIFY) 30 MG tablet Take 30 mg by mouth daily. 07/19/20   [provider]  cabergoline (DOSTINEX) 0.5 MG tablet 1 pill twice a week 06/12/22   Reather Littler, MD  cetirizine (ZYRTEC) 10 MG tablet Take 1 tablet (10 mg total) by mouth daily. 01/20/23   Alfredo Martinez, MD  ciclopirox (PENLAC) 8 % solution Apply topically at bedtime. Apply over nail and surrounding skin. Apply daily over previous coat. After seven (7) days, may remove with alcohol and continue cycle. 01/30/23   Alfredo Martinez, MD  clindamycin (CLEOCIN-T) 1 % external solution Apply topically 2 (two) times daily as  needed. Discuss on next visit before next refill. 03/10/23   Alfredo Martinez, MD  clindamycin-benzoyl peroxide (BENZACLIN) gel Apply topically 2 (two) times daily. 01/20/23   Alfredo Martinez, MD  desonide (DESOWEN) 0.05 % cream Apply topically 2 (two) times daily. Do not use for more than 4 weeks 01/20/23   Alfredo Martinez, MD  desonide (DESOWEN) 0.05 % ointment Apply 1 Application topically 2 (two) times daily. 01/22/23   Alfredo Martinez, MD  FIBER PO Take by mouth.    [provider]  fluticasone (FLONASE) 50 MCG/ACT nasal spray Place 2 sprays into both nostrils daily. 01/03/22   Alfredo Martinez, MD  Ibrexafungerp Citrate 150 MG TABS Take 2 tablets by mouth 2 (two) times daily. 07/07/21   Lesly Dukes, MD  MAGNESIUM PO Take by mouth.    [provider]  Multiple Vitamin (MULTIVITAMIN) tablet Take 1 tablet by mouth daily.    [provider]  nortriptyline (PAMELOR) 50 MG capsule TAKE 2 CAPSULES (100 MG TOTAL) BY MOUTH AT BEDTIME. 08/02/21   Everlena Cooper, Adam R, DO  Olopatadine HCl 0.2 % SOLN Apply 1 drop to eye daily. 09/21/18   Caro Laroche, DO  Omega-3 Fatty Acids (FISH OIL PO) Take by mouth.    [provider]  omeprazole (PRILOSEC) 40 MG capsule Take 1 capsule (40 mg total) by mouth daily. 01/20/23   Alfredo Martinez, MD  phenazopyridine (PYRIDIUM) 100 MG tablet Take 1 tablet (100 mg total) by mouth 3 (three) times daily as needed for pain. 12/11/22   Alicia Amel, MD  Plecanatide (TRULANCE) 3 MG TABS Take 1 tablet by mouth daily. 05/12/19   Meryl Dare, MD  Riboflavin-Magnesium-Feverfew 100-90-25 MG TABS Take 100 mg by mouth daily. 03/25/19   Lennox Solders, MD  rizatriptan (MAXALT) 10 MG tablet Take 1 tablet (10 mg total) by mouth as needed for migraine. May repeat after 2 hours if needed.  Maximum 2 tablets in 24 hours. 10/29/21   Drema Dallas, DO  tiZANidine (ZANAFLEX) 2 MG tablet Take 1 tablet (2 mg total) by mouth at bedtime. 07/23/22   Waldon Merl, PA-C  topiramate (TOPAMAX) 25 MG tablet Take 1 tablet (25 mg total) by mouth at bedtime. 10/29/21   Drema Dallas, DO  ZINC SULFATE-VITAMIN C MT Use as directed in the mouth or throat.    [provider]  zolpidem (AMBIEN) 10 MG tablet Take 10 mg by mouth at bedtime as needed. 07/19/20   [provider]     Family History  Problem Relation Age of Onset   Hypertension Mother    Hypercholesterolemia Mother    Colon polyps Maternal Grandmother    Colon cancer Neg Hx     Esophageal cancer Neg Hx    Stomach cancer Neg Hx    Rectal cancer Neg Hx     Social History   Socioeconomic History   Marital status: Single    Spouse name: Not on file   Number of children: 1   Years of education: Not on file   Highest education level: Not on file  Occupational History   Occupation: not working  Tobacco Use   Smoking status: Never   Smokeless tobacco: Never  Vaping Use   Vaping status: Never Used  Substance and Sexual Activity   Alcohol use: Yes    Comment: holidays   Drug use: No   Sexual activity: Yes    Birth control/protection: Condom  Other Topics Concern  Not on file  Social History Narrative   Right handed   Lives in two story home with son.   Social Determinants of Health   Financial Resource Strain: Low Risk  (11/18/2019)   [facility]   Overall Financial Resource Strain (CARDIA)    Difficulty of Paying Living Expenses: Not very hard  Food Insecurity: No Food Insecurity (11/18/2019)   [facility] from [facility], [facility]   Hunger Vital Sign    Worried About Running Out of Food in the Last Year: Never true    Ran Out of Food in the Last Year: Never true  Transportation Needs: No Transportation Needs (11/18/2019)   Received from [facility], [facility]   PRAPARE - Transportation    Lack of Transportation (Medical): No    Lack of Transportation (Non-Medical): No  Physical Activity: Inactive (11/18/2019)   Received from [facility], [facility]   Exercise Vital Sign    Days of Exercise per Week: 0 days    Minutes of Exercise per Session: 0 min  Stress: Not on file  Social Connections: Not on file     Vital Signs: There were no vitals taken for this visit.  No physical exam was performed in lieu of virtual telephone visit.   Imaging: US thyroid 01/11/22  4.4 x 2.1 x 3.1 cm = 15 cc   US thyroid  05/17/22  3.8 x 2.1 x 1.7 cm = 7.1 cc     US Thyroid 08/27/22  3.2 x 2.1 x 1.3 cm = 4.4 cc   Labs: CBC 05/21/21 WBC 6.0, Hb 13, Hct 41.7, Plt 332   Coags None available   TFTs 07/23/21 --> 05/21/22 Serum TSH 0.65 --> 1.34 Serum free T4 0.61 --> 0.9 Serum T3 not obtained --> 111 Thyroperoxidase Antibody not obtained Thyroglobulin Antibody not obtained Calcitonin not obtained   Prior Thyroid FNA: 12/07/21 - left thyroid nodule, [facility] (per Care Everywhere, [facility])   Reports of other prior left thyroid nodule FNA which were also benign   Assessment and Plan: 47 year old female with history of benign, symptomatic 4.4 (15 cc) left thyroid nodule now status post thyroid nodule radiofrequency ablation on 04/08/22. She is recovering well with significant decrease in neck fullness. 4 month follow up ultrasound demonstrates 70% volume reduction.   Electronically Signed: Mickie Kay 03/16/2023, 12:32 PM   I spent a total of 25 Minutes in virtual telephone clinical consultation, greater than 50% of which was counseling/coordinating care for symptomatic thyroid nodule.

## 2023-03-19 ENCOUNTER — Ambulatory Visit
Admission: RE | Admit: 2023-03-19 | Discharge: 2023-03-19 | Disposition: A | Discharge status: Home / Self Care | Payer: BC Managed Care – PPO | Source: Ambulatory Visit | Attending: Interventional Radiology | Admitting: Interventional Radiology

## 2023-03-19 DIAGNOSIS — E041 Nontoxic single thyroid nodule: Secondary | ICD-10-CM

## 2023-03-19 HISTORY — PX: IR RADIOLOGIST EVAL & MGMT: IMG5224

## 2023-03-20 ENCOUNTER — Other Ambulatory Visit (INDEPENDENT_AMBULATORY_CARE_PROVIDER_SITE_OTHER): Payer: BC Managed Care – PPO

## 2023-03-20 DIAGNOSIS — E221 Hyperprolactinemia: Secondary | ICD-10-CM | POA: Diagnosis not present

## 2023-03-21 LAB — PROLACTIN: Prolactin: 12.3 ng/mL

## 2023-03-27 ENCOUNTER — Encounter: Payer: Self-pay | Admitting: Endocrinology

## 2023-03-27 ENCOUNTER — Ambulatory Visit (INDEPENDENT_AMBULATORY_CARE_PROVIDER_SITE_OTHER): Payer: BC Managed Care – PPO | Admitting: Endocrinology

## 2023-03-27 VITALS — BP 122/70 | HR 75 | Resp 20 | Ht 62.0 in | Wt 167.0 lb

## 2023-03-27 DIAGNOSIS — E221 Hyperprolactinemia: Secondary | ICD-10-CM | POA: Diagnosis not present

## 2023-03-27 DIAGNOSIS — D352 Benign neoplasm of pituitary gland: Secondary | ICD-10-CM | POA: Diagnosis not present

## 2023-03-27 DIAGNOSIS — Z87898 Personal history of other specified conditions: Secondary | ICD-10-CM

## 2023-03-27 DIAGNOSIS — E042 Nontoxic multinodular goiter: Secondary | ICD-10-CM

## 2023-03-27 DIAGNOSIS — E041 Nontoxic single thyroid nodule: Secondary | ICD-10-CM

## 2023-03-27 MED ORDER — CABERGOLINE 0.5 MG PO TABS
0.2500 mg | ORAL_TABLET | ORAL | 4 refills | Status: DC
Start: 2023-03-27 — End: 2023-06-30

## 2023-03-27 NOTE — Progress Notes (Signed)
Outpatient Endocrinology Note Kristin Iram Lundberg, MD  03/27/23  Patient's Name: Kristin Daniels    DOB: Jul 01, 1975    MRN: [id number]  REASON OF VISIT: Follow up for prolactinoma/hyperprolactinemia /left thyroid nodule.  PCP: Alfredo Martinez, MD  HISTORY OF PRESENT ILLNESS:   Kristin Daniels is a 47 y.o. old female with past medical history listed below, is here for follow up of pituitary concerns /prolactinoma/hyperprolactinemia/left thyroid nodule.    # Pertinent Pituitary History: - Patient was diagnosed with prolactinoma and hyperprolactinemia in 2009 with original size of pituitary adenoma of 7 mm initially presented with galactorrhea and headache was evaluated in [place], had seen endocrinologist, had periodic lab work, MRI, started on cabergoline and continued.  She moved to this area, in 2016, she was first seen in endocrinology clinic by Dr. Lucianne Muss in March 2019.  Patient does not know her baseline prolactin level prior to starting cabergoline.  Prolactin was 45 in December 2016.  Around 2018 her primary care provider had restarted cabergoline half tablet once a week.,  In late 2018 she had mild galactorrhea and breast fullness cabergoline was increased to half tablet twice a week.  With a relatively higher level of prolactin she usually have symptoms of breast swelling, fullness and tenderness, along with galactorrhea on expression but no change in menstrual cycles.  Historically in June 2022 she had missed couple of weeks of cabergoline however her prolactin was upper normal 22.7 without any change in symptoms.  In visit in February 2024, she had complaints of breast tenderness and heaviness with occasional breast discharge on expression and was bothersome and wanted to have symptomatic treatments.  At that time she was taking cabergoline 1 mg 2 times a week.  Patient was seen by Dr. Lucianne Muss in May 2024, according to the note she was taking cabergoline 1 mg 2 times a week and had advised to take 1  mg alternate with 0.5 mg in a week. There is discrepancy between the doses of the cabergoline mentioned on the note and the patient reported.  Patient reports that she is actually taking cabergoline 1 tablet which is 0.5 mg 2 times a week since her last visit in May, 2024.Before that she was taking 1 tablet which is 0.5 mg alternating with half tablet which is at 0.25 mg in a week.  Echocardiogram in February 26, 2023, normal heart valves.  Pituitary adenoma/prolactinoma is being monitored with MRI, she has been following with neurology. MRI brain in October 09, 2022 : IMPRESSION:  8 x 6 mm sellar lesion, unchanged in size and appearance dating back to the brain MRI of 05/31/2019. 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 Cystic pituitary adenoma not excluded.  Prior MRI in June 2023 and January 2021 with a stable pituitary microadenoma.   She reports she has recently seen ophthalmology and had normal peripheral vision.  During her pregnancy when she was not on regular medications she had developed severe diabetes insipidus which was controlled with her restarting treatment and she took initially bromocriptine and subsequently cabergoline during her pregnancy.  # Left thyroid nodule -She was diagnosed with multiple thyroid nodules several years ago on routine exam. In 2013 she had baseline size of left thyroid nodule was 2.8 cm.  Records showed that Needle Aspiration Biopsies of Left Dominant Thyroid Nodule 3 Times, Biopsy had been Benign consistently Including Afirma Testing.  In December 2021 thyroid nodule was measuring 4.4 cm compared to 3.5 cm in 2018, status post  fine-needle aspiration in September 2023 showed scanty but normal follicular epithelium.  She was concerned with the appearance of her neck swelling from the nodule, she underwent radiofrequency ablation by interventional radiologist on April 08, 2022.  Previously in November 2021 her left-sided nodule  was about 3 to 3.5 cm, ultrasound in September 2023 nodule was 4.4 cm.  With the ablation or nodule progressively improved in size and measuring 3.2 cm swelling 70% reduction in volume as of ultrasound in April 2024.  There is a small cyst on the right thyroid lobe.  Ultrasound thyroid in February 21, 2023 left thyroid nodule measuring 3.2 cm unchanged compared to ultrasound in April 2024.  She has normal thyroid function test with normal TSH and low normal free T4 mostly.  She has not been on thyroid medication.  Per records: Her first biopsy was done in 09/2011 which had shown indeterminate cytology and this was confirmed to be benign on Affirma testing, at that time her nodule was 2.8 cm.  Needle aspiration done in 01/2015 indicated she had a 3 cm nodule that previously had been benign; this showed scanty cellular specimen along with colloid and degenerated macrophages In 08/2016 the needle aspiration biopsy showed a benign follicular adenoma and the nodule size was indicated at 3.7 cm  Interval history Patient has been taking cabergoline half tablet 2 times a week since August 2024.  Patient continues to complains of fullness /enlargement of the breasts and may get headaches precipitated galactorrhea, however she does not do it.  Recent prolactin level normal.  No new headache.  No peripheral vision loss.  Patient is planned to have mammogram next month.  She had echocardiogram last month with normal heart valves.  She had ultrasound thyroid last month with unchanged size of left thyroid nodule measuring 3.2 cm.  Patient has no neck compressive symptoms.  She has occasional constipation.  Usual energy level, not increased fatigue.  Body weight is about the same.  She continues to have mildly low free T4 and normal TSH.  She has been taking multivitamin, vitamin D3, vitamin B12, magnesium, vitamin C and omega-3 fish oil daily.  No additional biotin intake.   Latest Reference Range & Units  02/05/23 11:04 03/20/23 10:04  Prolactin ng/mL 11.9 12.3  TSH 0.35 - 5.50 uIU/mL 1.03   T4,Free(Direct) 0.60 - 1.60 ng/dL 4.09 (L)   (L): Data is abnormally low   REVIEW OF SYSTEMS:  As per history of present illness.   PAST MEDICAL HISTORY: Past Medical History:  Diagnosis Date   Acid reflux    Colon polyp    Costochondral chest pain 12/01/2020   H. pylori infection    H. pylori infection 03/26/2019   History of seasonal allergies    Hypercholesterolemia    Lumbar radiculopathy, acute 04/10/2021   Lymph node symptom 09/21/2018   Migraines    Oral candidiasis 05/21/2021   Pain of right lower leg 12/01/2020   Pituitary tumor    Thyroid nodule    Vaginal candidiasis 05/21/2021   Vaginal Pap smear, abnormal     PAST SURGICAL HISTORY: Past Surgical History:  Procedure Laterality Date   CESAREAN SECTION     IR RADIOLOGIST EVAL & MGMT  01/11/2022   IR RADIOLOGIST EVAL & MGMT  05/23/2022   IR RADIOLOGIST EVAL & MGMT  09/03/2022   IR RADIOLOGIST EVAL & MGMT  03/19/2023   OTHER SURGICAL HISTORY     hemangio removed at child and had something added to head to  help indentation    ALLERGIES: Allergies  Allergen Reactions   Metronidazole Anaphylaxis and Hives   Pollen Extract    Diflucan [Fluconazole] Rash    Broke out in a rash with hives around her mouth, hands, face.    FAMILY HISTORY:  Family History  Problem Relation Age of Onset   Hypertension Mother    Hypercholesterolemia Mother    Colon polyps Maternal Grandmother    Colon cancer Neg Hx    Esophageal cancer Neg Hx    Stomach cancer Neg Hx    Rectal cancer Neg Hx     SOCIAL HISTORY: Social History   Socioeconomic History   Marital status: Single    Spouse name: Not on file   Number of children: 1   Years of education: Not on file   Highest education level: Not on file  Occupational History   Occupation: not working  Tobacco Use   Smoking status: Never   Smokeless tobacco: Never  Vaping Use    Vaping status: Never Used  Substance and Sexual Activity   Alcohol use: Yes    Comment: holidays   Drug use: No   Sexual activity: Yes    Birth control/protection: Condom  Other Topics Concern   Not on file  Social History Narrative   Right handed   Lives in two story home with son.   Social Determinants of Health   Financial Resource Strain: Low Risk  (11/18/2019)   [facility]   Overall Financial Resource Strain (CARDIA)    Difficulty of Paying Living Expenses: Not very hard  Food Insecurity: No Food Insecurity (11/18/2019)   [facility]   Hunger Vital Sign    Worried About Running Out of Food in the Last Year: Never true    Ran Out of Food in the Last Year: Never true  Transportation Needs: No Transportation Needs (11/18/2019)   [facility] from [facility], [facility]   PRAPARE - Transportation    Lack of Transportation (Medical): No    Lack of Transportation (Non-Medical): No  Physical Activity: Inactive (11/18/2019)   [facility] from [facility], [facility]   Exercise Vital Sign    Days of Exercise per Week: 0 days    Minutes of Exercise per Session: 0 min  Stress: Not on file  Social Connections: Not on file    MEDICATIONS:  Current Outpatient Medications  Medication Sig Dispense Refill   ALPRAZolam (XANAX) 1 MG tablet Take 1 mg by mouth daily.     ARIPiprazole (ABILIFY) 30 MG tablet Take 30 mg by mouth daily.     cetirizine (ZYRTEC) 10 MG tablet Take 1 tablet (10 mg total) by mouth daily. 90 tablet 3   ciclopirox (PENLAC) 8 % solution Apply topically at bedtime. Apply over nail and surrounding skin. Apply daily over previous coat. After seven (7) days, may remove with alcohol and continue cycle. 6.6 mL 0   clindamycin (CLEOCIN-T) 1 % external solution Apply topically 2 (two) times daily as needed. Discuss on next  visit before next refill. 30 mL 2   clindamycin-benzoyl peroxide (BENZACLIN) gel Apply topically 2 (two) times daily. 25 g 0   desonide (DESOWEN) 0.05 % cream Apply topically 2 (two) times daily. Do not use for more than 4 weeks 30 g 0   desonide (DESOWEN) 0.05 % ointment Apply 1 Application topically 2 (two) times daily. 15 g 0   FIBER PO Take by mouth.  fluticasone (FLONASE) 50 MCG/ACT nasal spray Place 2 sprays into both nostrils daily. 16 g 6   Ibrexafungerp Citrate 150 MG TABS Take 2 tablets by mouth 2 (two) times daily. 4 tablet 0   MAGNESIUM PO Take by mouth.     Multiple Vitamin (MULTIVITAMIN) tablet Take 1 tablet by mouth daily.     nortriptyline (PAMELOR) 50 MG capsule TAKE 2 CAPSULES (100 MG TOTAL) BY MOUTH AT BEDTIME. 180 capsule 2   Olopatadine HCl 0.2 % SOLN Apply 1 drop to eye daily. 1 Bottle 0   Omega-3 Fatty Acids (FISH OIL PO) Take by mouth.     omeprazole (PRILOSEC) 40 MG capsule Take 1 capsule (40 mg total) by mouth daily. 30 capsule 3   phenazopyridine (PYRIDIUM) 100 MG tablet Take 1 tablet (100 mg total) by mouth 3 (three) times daily as needed for pain. 20 tablet 0   Plecanatide (TRULANCE) 3 MG TABS Take 1 tablet by mouth daily. 30 tablet 11   Riboflavin-Magnesium-Feverfew 100-90-25 MG TABS Take 100 mg by mouth daily. 30 tablet 0   rizatriptan (MAXALT) 10 MG tablet Take 1 tablet (10 mg total) by mouth as needed for migraine. May repeat after 2 hours if needed.  Maximum 2 tablets in 24 hours. 10 tablet 5   tiZANidine (ZANAFLEX) 2 MG tablet Take 1 tablet (2 mg total) by mouth at bedtime. 15 tablet 0   topiramate (TOPAMAX) 25 MG tablet Take 1 tablet (25 mg total) by mouth at bedtime. 30 tablet 5   ZINC SULFATE-VITAMIN C MT Use as directed in the mouth or throat.     zolpidem (AMBIEN) 10 MG tablet Take 10 mg by mouth at bedtime as needed.     cabergoline (DOSTINEX) 0.5 MG tablet Take 0.5 tablets (0.25 mg total) by mouth 2 (two) times a week. 12 tablet 4   No current  facility-administered medications for this visit.    PHYSICAL EXAM: Vitals:   03/27/23 1013  BP: 122/70  Pulse: 75  Resp: 20  SpO2: 97%  Weight: 167 lb (75.8 kg)  Height: 5\' 2"  (1.575 m)    Body mass index is 30.54 kg/m.   General: Well developed, well nourished female in no apparent distress. No cushingoid and acromegalic appearance HEENT: AT/[place], no external lesions. Hearing intact to the spoken word Eyes: EOMI. Conjunctiva clear and no icterus. Visual Field by confrontation grossly intact Neck: Trachea midline, neck supple without appreciable thyromegaly or lymphadenopathy and left palpable thyroid nodule + Lungs: Clear to auscultation, no wheeze. Respirations not labored Heart: S1S2, Regular in rate and rhythm.  No murmur. Abdomen: Soft, non tender Neurologic: Alert, oriented, normal speech, deep tendon biceps reflexes normal,  no gross focal neurological deficit Extremities: No pedal pitting edema, no tremors of outstretched hands Skin: Warm, color good.  Psychiatric: Does not appear depressed or anxious  PERTINENT HISTORIC LABORATORY AND IMAGING STUDIES:  All pertinent laboratory results were reviewed. Please see HPI also for further details.    ASSESSMENT / PLAN  1. Hyperprolactinemia (HCC)   2. Multiple thyroid nodules   3. Prolactinoma (HCC)   4. Left thyroid nodule   5. Multinodular goiter   6. Pituitary adenoma (HCC)   7. 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 Cystic pituitary adenoma not excluded. -Patient has been on cabergoline for several years, seems like it was  on and off however restarted in 2018. -She has normal prolactin in last few years. -She gets symptoms of breast discomfort, fullness and possibly galactorrhea on low-dose of cabergoline or higher normal range of  prolactin. -She is currently taking cabergoline 0.25 mg / 1/2 tablet 2 times a week Sunday and Thursday since around August 2024.  Recent prolactin level normal 12.3 with upper normal limit of 30.  # Multinodular goiter /left thyroid nodule -Left thyroid nodule measuring more than 4 cm, status post multiple FNAs in the past including Afirma with benign cytology last FNA was in September 2023.  Status post radiofrequency ablation in December 2023 of this nodule and that has been decreasing the size was measured 3.2 cm with 70% decrease in volume and ultrasound in April 2024 and August 2024.  There is a small cyst in her right thyroid lobe. -Will monitor left thyroid nodule with serial ultrasound. -She is euthyroid and had always normal thyroid function test with normal TSH and usually low normal free T4.  Recent free T4 was mildly low.  Plan: -Continue cabergoline 0.25 mg 2 times a week.  She complains of fullness and enlargement of breast with possible galactorrhea by expression.  Patient is scheduled to have mammogram next month. -No plan to start thyroid medication at this time.  She had mildly low free T4 with normal TSH last month.  She is clinically euthyroid. -Check thyroid function test TSH, free T4, free T3 in next follow-up visit after holding multivitamin 1 week prior to the test, to avoid possibility of lab interference with biotin.   Diagnoses and all orders for this visit:  Hyperprolactinemia (HCC) -     cabergoline (DOSTINEX) 0.5 MG tablet; Take 0.5 tablets (0.25 mg total) by mouth 2 (two) times a week. -     Prolactin  Multiple thyroid nodules -     T4, free -     TSH -     T3, free  Prolactinoma (HCC)  Left thyroid nodule  Multinodular goiter  Pituitary adenoma (HCC)  History of pituitary tumor -     cabergoline (DOSTINEX) 0.5 MG tablet; Take 0.5 tablets (0.25 mg total) by mouth 2 (two) times a week.     DISPOSITION Follow up in clinic in 3 months  suggested.  All questions answered and patient verbalized understanding of the plan.  Kristin Teiara Baria, MD [facility] Endocrinology [facility] [address] [place], [address] [place], [place] [address] Phone # [phone]  At least part of this note was generated using voice recognition software. Inadvertent word errors may have occurred, which were not recognized during the proofreading process.

## 2023-03-27 NOTE — Patient Instructions (Signed)
Please hold multivitamin 1 week before lab next time.

## 2023-04-10 ENCOUNTER — Ambulatory Visit
Admission: RE | Admit: 2023-04-10 | Discharge: 2023-04-10 | Disposition: A | Discharge status: Home / Self Care | Payer: BC Managed Care – PPO | Source: Ambulatory Visit | Attending: Family Medicine | Admitting: Family Medicine

## 2023-04-10 DIAGNOSIS — Z1231 Encounter for screening mammogram for malignant neoplasm of breast: Secondary | ICD-10-CM

## 2023-04-15 ENCOUNTER — Other Ambulatory Visit: Payer: Self-pay | Admitting: Family Medicine

## 2023-04-15 DIAGNOSIS — R928 Other abnormal and inconclusive findings on diagnostic imaging of breast: Secondary | ICD-10-CM

## 2023-04-21 ENCOUNTER — Ambulatory Visit (HOSPITAL_BASED_OUTPATIENT_CLINIC_OR_DEPARTMENT_OTHER): Payer: BC Managed Care – PPO | Admitting: Cardiology

## 2023-04-23 ENCOUNTER — Telehealth: Payer: PRIVATE HEALTH INSURANCE | Admitting: Family Medicine

## 2023-04-23 DIAGNOSIS — H0100B Unspecified blepharitis left eye, upper and lower eyelids: Secondary | ICD-10-CM | POA: Diagnosis not present

## 2023-04-23 DIAGNOSIS — H0100A Unspecified blepharitis right eye, upper and lower eyelids: Secondary | ICD-10-CM

## 2023-04-23 MED ORDER — ERYTHROMYCIN 5 MG/GM OP OINT: 1.0000 | TOPICAL_OINTMENT | Freq: Two times a day (BID) | OPHTHALMIC | 0 refills | Status: DC

## 2023-04-23 NOTE — Patient Instructions (Addendum)
Kristin Daniels, thank you for joining [name], NP for today's virtual visit.  While this provider is not your primary care provider (PCP), if your PCP is located in our provider database this encounter information will be shared with them immediately following your visit.   A [facility] MyChart account gives you access to today's visit and all your visits, tests, and labs performed at [facility] " click here if you don't have a [facility] MyChart account or go to [email or web address]  Consent: (Patient) Kristin Daniels provided verbal consent for this virtual visit at the beginning of the encounter.  Current Medications:  Current Outpatient Medications:    erythromycin ophthalmic ointment, Place 1 Application into both eyes 2 (two) times daily. Apply a thin ribbon line to the eye lid, Disp: 3.5 g, Rfl: 0   ALPRAZolam (XANAX) 1 MG tablet, Take 1 mg by mouth daily., Disp: , Rfl:    ARIPiprazole (ABILIFY) 30 MG tablet, Take 30 mg by mouth daily., Disp: , Rfl:    cabergoline (DOSTINEX) 0.5 MG tablet, Take 0.5 tablets (0.25 mg total) by mouth 2 (two) times a week., Disp: 12 tablet, Rfl: 4   cetirizine (ZYRTEC) 10 MG tablet, Take 1 tablet (10 mg total) by mouth daily., Disp: 90 tablet, Rfl: 3   ciclopirox (PENLAC) 8 % solution, Apply topically at bedtime. Apply over nail and surrounding skin. Apply daily over previous coat. After seven (7) days, may remove with alcohol and continue cycle., Disp: 6.6 mL, Rfl: 0   clindamycin (CLEOCIN-T) 1 % external solution, Apply topically 2 (two) times daily as needed. Discuss on next visit before next refill., Disp: 30 mL, Rfl: 2   clindamycin-benzoyl peroxide (BENZACLIN) gel, Apply topically 2 (two) times daily., Disp: 25 g, Rfl: 0   desonide (DESOWEN) 0.05 % cream, Apply topically 2 (two) times daily. Do not use for more than 4 weeks, Disp: 30 g, Rfl: 0   desonide (DESOWEN) 0.05 % ointment, Apply 1 Application topically 2 (two) times  daily., Disp: 15 g, Rfl: 0   FIBER PO, Take by mouth., Disp: , Rfl:    fluticasone (FLONASE) 50 MCG/ACT nasal spray, Place 2 sprays into both nostrils daily., Disp: 16 g, Rfl: 6   Ibrexafungerp Citrate 150 MG TABS, Take 2 tablets by mouth 2 (two) times daily., Disp: 4 tablet, Rfl: 0   MAGNESIUM PO, Take by mouth., Disp: , Rfl:    Multiple Vitamin (MULTIVITAMIN) tablet, Take 1 tablet by mouth daily., Disp: , Rfl:    nortriptyline (PAMELOR) 50 MG capsule, TAKE 2 CAPSULES (100 MG TOTAL) BY MOUTH AT BEDTIME., Disp: 180 capsule, Rfl: 2   Olopatadine HCl 0.2 % SOLN, Apply 1 drop to eye daily., Disp: 1 Bottle, Rfl: 0   Omega-3 Fatty Acids (FISH OIL PO), Take by mouth., Disp: , Rfl:    omeprazole (PRILOSEC) 40 MG capsule, Take 1 capsule (40 mg total) by mouth daily., Disp: 30 capsule, Rfl: 3   phenazopyridine (PYRIDIUM) 100 MG tablet, Take 1 tablet (100 mg total) by mouth 3 (three) times daily as needed for pain., Disp: 20 tablet, Rfl: 0   Plecanatide (TRULANCE) 3 MG TABS, Take 1 tablet by mouth daily., Disp: 30 tablet, Rfl: 11   Riboflavin-Magnesium-Feverfew 100-90-25 MG TABS, Take 100 mg by mouth daily., Disp: 30 tablet, Rfl: 0   rizatriptan (MAXALT) 10 MG tablet, Take 1 tablet (10 mg total) by mouth as needed for migraine. May repeat after 2 hours if needed.  Maximum 2 tablets in  24 hours., Disp: 10 tablet, Rfl: 5   tiZANidine (ZANAFLEX) 2 MG tablet, Take 1 tablet (2 mg total) by mouth at bedtime., Disp: 15 tablet, Rfl: 0   topiramate (TOPAMAX) 25 MG tablet, Take 1 tablet (25 mg total) by mouth at bedtime., Disp: 30 tablet, Rfl: 5   ZINC SULFATE-VITAMIN C MT, Use as directed in the mouth or throat., Disp: , Rfl:    zolpidem (AMBIEN) 10 MG tablet, Take 10 mg by mouth at bedtime as needed., Disp: , Rfl:    Medications ordered in this encounter:  Meds ordered this encounter  Medications   erythromycin ophthalmic ointment    Sig: Place 1 Application into both eyes 2 (two) times daily. Apply a thin  ribbon line to the eye lid    Dispense:  3.5 g    Refill:  0    Supervising Provider:   Merrilee Jansky [[id number]]     *If you need refills on other medications prior to your next appointment, please contact your pharmacy*  Follow-Up: Call back or seek an in-person evaluation if the symptoms worsen or if the condition fails to improve as anticipated.  [facility] [phone]  Other Instructions   -keep eyes clean -avoid rubbing -avoid make up- toss previous eye make up -wash daily and prior to ointment application -follow up if not improving  Blepharitis Blepharitis is swelling of the eyelids. It can cause the eyes to feel dry or gritty. Other symptoms may include: Reddish, scaly skin around the scalp and eyebrows. Eyelids that itch or burn. Fluid that leaks from the eye at night. This causes the eyelashes to stick together in the morning. Eyelashes that fall out. Redness of the eyes. Eyes that are sensitive to light. Follow these instructions at home: Watch for any changes in how your eyes look or feel. Tell your doctor about any changes. Follow these instructions to help with your condition. Keeping clean Wash your hands often with soap and water for at least 20 seconds. Clean your eyes. Wash the edges of your eyelids using eyelid wipes or a small amount of baby shampoo that has been mixed with warm water (diluted). Do this 2 or more times a day. Wash your face and eyebrows at least once a day. Use a clean towel each time you dry your eyelids. Do not use the towel to clean or dry other areas of your body. Do not share your towel with anyone. General instructions Avoid wearing makeup until you get better. Do not share makeup with anyone. Avoid rubbing your eyes. Use a warm compress on your eyes for 5-10 minutes at a time. Do this 1 or 2 times a day, or as told by your doctor. You can use: A towel with warm water on it. A heating pad that can be warmed in  the microwave. The pad should be very warm but not hot enough to burn the skin. If you were given an antibiotic cream or eye drops, use the medicine as told by your doctor. Do not stop using the medicine even if you feel better. Keep all follow-up visits. Contact a doctor if: Your eyelids feel hot. You have blisters on your eyelids. You have a rash on your eyelids. The swelling does not go away in 2-4 days. The swelling gets worse. Get help right away if: You have pain that gets worse or spreads to other parts of your face. You have redness that gets worse or spreads to other  parts of your face. You have changes in how you see (vision). You have pain when you look at lights or things that move. You have a fever. Summary Blepharitis is swelling of the eyelids. Watch for any changes in how your eyes look or feel. Tell your doctor about any changes. Follow home care instructions as told by your doctor. Wash your hands often with soap and water for at least 20 seconds. Avoid wearing makeup. Do not rub your eyes. Use a warm compress, creams, or eye drops as told by your doctor. Let your doctor know if you have changes in how you see, blisters or a rash on your eyelids, or other problems. This information is not intended to replace advice given to you by your health care provider. Make sure you discuss any questions you have with your health care provider. Document Revised: 05/24/2020 Document Reviewed: 05/24/2020 Elsevier Patient Education  2024 Elsevier Inc.   If you have been instructed to have an in-person evaluation today at a local Urgent Care facility, please use the link below. It will take you to a list of all of our available [facility], including address, phone number and hours of operation. Please do not delay care.  [facility]  If you or a family member do not have a primary care provider, use the link below to schedule a visit and establish care. When  you choose a [facility] primary care physician or advanced practice provider, you gain a long-term partner in health. Find a Primary Care Provider  Learn more about [facility]'s in-office and virtual care options: [facility] [facility] - Get Care Now

## 2023-04-23 NOTE — Progress Notes (Signed)
Virtual Visit Consent   Kristin Daniels, you are scheduled for a virtual visit with a [facility] provider today. Just as with appointments in the office, your consent must be obtained to participate. Your consent will be active for this visit and any virtual visit you may have with one of our providers in the next 365 days. If you have a MyChart account, a copy of this consent can be sent to you electronically.  As this is a virtual visit, video technology does not allow for your provider to perform a traditional examination. This may limit your provider's ability to fully assess your condition. If your provider identifies any concerns that need to be evaluated in person or the need to arrange testing (such as labs, EKG, etc.), we will make arrangements to do so. Although advances in technology are sophisticated, we cannot ensure that it will always work on either your end or our end. If the connection with a video visit is poor, the visit may have to be switched to a telephone visit. With either a video or telephone visit, we are not always able to ensure that we have a secure connection.  By engaging in this virtual visit, you consent to the provision of healthcare and authorize for your insurance to be billed (if applicable) for the services provided during this visit. Depending on your insurance coverage, you may receive a charge related to this service.  I need to obtain your verbal consent now. Are you willing to proceed with your visit today? [name] has provided verbal consent on 04/23/2023 for a virtual visit (video or telephone). [name], NP  Date: 04/23/2023 2:41 PM  Virtual Visit via Video Note   I, [name], connected with  Kristin Daniels  ([id number], 1975/09/09) on 04/23/23 at  2:45 PM EST by a video-enabled telemedicine application and verified that I am speaking with the correct person using two identifiers.  Location: Patient: Virtual Visit Location Patient:  Home Provider: Virtual Visit Location Provider: Home Office   I discussed the limitations of evaluation and management by telemedicine and the availability of in person appointments. The patient expressed understanding and agreed to proceed.    History of Present Illness: Kristin Daniels is a 47 y.o. who identifies as a female who was assigned female at birth, and is being seen today for eye lid swelling.  Happened last Saturday after going to dinner, did not think it was anything to be concerned about, took some benadryl but reports it did not improve.  Upper eye lids feel itching, burning, and puffy swollen, like something is in there but nothing is. Mild blurried vision- but reports it seems like the eye lids are heavy. Reports some crusting in the mornings. Bottom lids feels uncomfortable as well.   Denies new make up, changes in facial products, no new foods. Denies double vision, no pain with eye movement, eye movement intake.   Reports she has had blepharitis before a few years back.   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   Medications:  Current Outpatient Medications:    ALPRAZolam (XANAX) 1 MG tablet, Take 1 mg by mouth daily., Disp: , Rfl:    ARIPiprazole (ABILIFY)  30 MG tablet, Take 30 mg by mouth daily., Disp: , Rfl:    cabergoline (DOSTINEX) 0.5 MG tablet, Take 0.5 tablets (0.25 mg total) by mouth 2 (two) times a week., Disp: 12 tablet, Rfl: 4   cetirizine (ZYRTEC) 10 MG tablet, Take 1 tablet (10 mg total) by mouth daily., Disp: 90 tablet, Rfl: 3   ciclopirox (PENLAC) 8 % solution, Apply topically at bedtime. Apply over nail and surrounding skin. Apply daily over previous coat. After seven (7) days, may remove with alcohol and continue cycle., Disp: 6.6 mL, Rfl: 0   clindamycin (CLEOCIN-T) 1 % external solution, Apply topically 2 (two) times daily as needed. Discuss on next visit before next refill., Disp: 30 mL, Rfl: 2   clindamycin-benzoyl peroxide (BENZACLIN) gel, Apply topically 2 (two) times daily., Disp: 25 g, Rfl: 0   desonide (DESOWEN) 0.05 % cream, Apply topically 2 (two) times daily. Do not use for more than 4 weeks, Disp: 30 g, Rfl: 0   desonide (DESOWEN) 0.05 % ointment, Apply 1 Application topically 2 (two) times daily., Disp: 15 g, Rfl: 0   FIBER PO, Take by mouth., Disp: , Rfl:    fluticasone (FLONASE) 50 MCG/ACT nasal spray, Place 2 sprays into both nostrils daily., Disp: 16 g, Rfl: 6   Ibrexafungerp Citrate 150 MG TABS, Take 2 tablets by mouth 2 (two) times daily., Disp: 4 tablet, Rfl: 0   MAGNESIUM PO, Take by mouth., Disp: , Rfl:    Multiple Vitamin (MULTIVITAMIN) tablet, Take 1 tablet by mouth daily., Disp: , Rfl:    nortriptyline (PAMELOR) 50 MG capsule, TAKE 2 CAPSULES (100 MG TOTAL) BY MOUTH AT BEDTIME., Disp: 180 capsule, Rfl: 2   Olopatadine HCl 0.2 % SOLN, Apply 1 drop to eye daily., Disp: 1 Bottle, Rfl: 0   Omega-3 Fatty Acids (FISH OIL PO), Take by mouth., Disp: , Rfl:    omeprazole (PRILOSEC) 40 MG capsule, Take 1 capsule (40 mg total) by mouth daily., Disp: 30 capsule, Rfl: 3   phenazopyridine (PYRIDIUM) 100 MG tablet, Take 1 tablet (100 mg total) by mouth 3 (three) times daily as needed for pain., Disp: 20 tablet, Rfl: 0    Plecanatide (TRULANCE) 3 MG TABS, Take 1 tablet by mouth daily., Disp: 30 tablet, Rfl: 11   Riboflavin-Magnesium-Feverfew 100-90-25 MG TABS, Take 100 mg by mouth daily., Disp: 30 tablet, Rfl: 0   rizatriptan (MAXALT) 10 MG tablet, Take 1 tablet (10 mg total) by mouth as needed for migraine. May repeat after 2 hours if needed.  Maximum 2 tablets in 24 hours., Disp: 10 tablet, Rfl: 5   tiZANidine (ZANAFLEX) 2 MG tablet, Take 1 tablet (2 mg total) by mouth at bedtime., Disp: 15 tablet, Rfl: 0   topiramate (TOPAMAX) 25 MG tablet, Take 1 tablet (25 mg total) by mouth at bedtime., Disp: 30 tablet, Rfl: 5   ZINC SULFATE-VITAMIN C MT, Use as directed in the mouth or throat., Disp: , Rfl:    zolpidem (AMBIEN) 10 MG tablet, Take 10 mg by mouth at bedtime as needed.,  Disp: , Rfl:   Observations/Objective: Patient is well-developed, well-nourished in no acute distress.  Resting comfortably  at home.  Head is normocephalic, atraumatic.  No labored breathing.  Speech is clear and coherent with logical content.  Patient is alert and oriented at baseline.  Bilateral mild swelling of upper eye lid- noted redness  No drainage. EOM intact   Assessment and Plan:  1. Blepharitis of upper and lower eyelids of both eyes, unspecified type (Primary)  - erythromycin ophthalmic ointment; Place 1 Application into both eyes 2 (two) times daily. Apply a thin ribbon line to the eye lid  Dispense: 3.5 g; Refill: 0  -keep eyes clean -avoid rubbing -avoid make up- toss previous eye make up -wash daily and prior to ointment application -follow up if not improving  Reviewed side effects, risks and benefits of medication.    Patient acknowledged agreement and understanding of the plan.   Past Medical, Surgical, Social History, Allergies, and Medications have been Reviewed.     Follow Up Instructions: I discussed the assessment and treatment plan with the patient. The patient was provided an opportunity to ask  questions and all were answered. The patient agreed with the plan and demonstrated an understanding of the instructions.  A copy of instructions were sent to the patient via MyChart unless otherwise noted below.    The patient was advised to call back or seek an in-person evaluation if the symptoms worsen or if the condition fails to improve as anticipated.    [name], NP

## 2023-04-24 ENCOUNTER — Ambulatory Visit: Payer: BC Managed Care – PPO

## 2023-04-24 NOTE — Progress Notes (Deleted)
  SUBJECTIVE:   CHIEF COMPLAINT / HPI:   Swollen eyelids:  PERTINENT  PMH / PSH: Allergic rhinitis, GERD, history of pituitary tumor  OBJECTIVE:  LMP 04/05/2023  Physical Exam   ASSESSMENT/PLAN:   Assessment & Plan  No follow-ups on file. Shelby Mattocks, DO 04/24/2023, 8:53 AM PGY-3, [facility] {    This will disappear when note is signed, click to select method of visit    :1}

## 2023-05-01 ENCOUNTER — Ambulatory Visit
Admission: RE | Admit: 2023-05-01 | Discharge: 2023-05-01 | Disposition: A | Discharge status: Home / Self Care | Payer: BC Managed Care – PPO | Source: Ambulatory Visit | Attending: Family Medicine | Admitting: Family Medicine

## 2023-05-01 DIAGNOSIS — R928 Other abnormal and inconclusive findings on diagnostic imaging of breast: Secondary | ICD-10-CM

## 2023-05-06 ENCOUNTER — Telehealth: Payer: PRIVATE HEALTH INSURANCE | Admitting: Physician Assistant

## 2023-05-06 DIAGNOSIS — R3989 Other symptoms and signs involving the genitourinary system: Secondary | ICD-10-CM | POA: Diagnosis not present

## 2023-05-06 MED ORDER — CEPHALEXIN 500 MG PO CAPS: 500.0000 mg | ORAL_CAPSULE | Freq: Two times a day (BID) | ORAL | 0 refills | Status: DC

## 2023-05-06 NOTE — Progress Notes (Signed)
 E-Visit for Urinary Problems  We are sorry that you are not feeling well.  Here is how we plan to help!  Based on what you shared with me it looks like you most likely have a simple urinary tract infection.  A UTI (Urinary Tract Infection) is a bacterial infection of the bladder.  Most cases of urinary tract infections are simple to treat but a key part of your care is to encourage you to drink plenty of fluids and watch your symptoms carefully.  I have prescribed Keflex  500 mg twice a day for 7 days.  Your symptoms should gradually improve. Call us  if the burning in your urine worsens, you develop worsening fever, back pain or pelvic pain or if your symptoms do not resolve after completing the antibiotic.  Urinary tract infections can be prevented by drinking plenty of water to keep your body hydrated.  Also be sure when you wipe, wipe from front to back and don't hold it in!  If possible, empty your bladder every 4 hours.  HOME CARE Drink plenty of fluids Compete the full course of the antibiotics even if the symptoms resolve Remember, when you need to go.go. Holding in your urine can increase the likelihood of getting a UTI! GET HELP RIGHT AWAY IF: You cannot urinate You get a high fever Worsening back pain occurs You see blood in your urine You feel sick to your stomach or throw up You feel like you are going to pass out  MAKE SURE YOU  Understand these instructions. Will watch your condition. Will get help right away if you are not doing well or get worse.   Thank you for choosing an e-visit.  Your e-visit answers were reviewed by a board certified advanced clinical practitioner to complete your personal care plan. Depending upon the condition, your plan could have included both over the counter or prescription medications.  Please review your pharmacy choice. Make sure the pharmacy is open so you can pick up prescription now. If there is a problem, you may contact your  provider through [employer] and have the prescription routed to another pharmacy.  Your safety is important to us . If you have drug allergies check your prescription carefully.   For the next 24 hours you can use MyChart to ask questions about today's visit, request a non-urgent call back, or ask for a work or school excuse. You will get an email in the next two days asking about your experience. I hope that your e-visit has been valuable and will speed your recovery.  I have spent 5 minutes in review of e-visit questionnaire, review and updating patient chart, medical decision making and response to patient.   Delon CHRISTELLA Dickinson, PA-C

## 2023-06-19 ENCOUNTER — Other Ambulatory Visit (HOSPITAL_COMMUNITY)
Admission: RE | Admit: 2023-06-19 | Discharge: 2023-06-19 | Disposition: A | Discharge status: Home / Self Care | Payer: PRIVATE HEALTH INSURANCE | Source: Ambulatory Visit | Attending: Family Medicine | Admitting: Family Medicine

## 2023-06-19 ENCOUNTER — Encounter: Payer: Self-pay | Admitting: Family Medicine

## 2023-06-19 ENCOUNTER — Ambulatory Visit (INDEPENDENT_AMBULATORY_CARE_PROVIDER_SITE_OTHER): Payer: MEDICAID | Admitting: Family Medicine

## 2023-06-19 VITALS — BP 114/80 | HR 98 | Ht 62.0 in | Wt 163.2 lb

## 2023-06-19 DIAGNOSIS — R350 Frequency of micturition: Secondary | ICD-10-CM | POA: Diagnosis not present

## 2023-06-19 LAB — POCT URINALYSIS DIP (MANUAL ENTRY)
Bilirubin, UA: NEGATIVE
Blood, UA: NEGATIVE
Glucose, UA: NEGATIVE mg/dL
Ketones, POC UA: NEGATIVE mg/dL
Leukocytes, UA: NEGATIVE
Nitrite, UA: NEGATIVE
Protein Ur, POC: NEGATIVE mg/dL
Spec Grav, UA: 1.015 (ref 1.010–1.025)
Urobilinogen, UA: 0.2 U/dL
pH, UA: 7 (ref 5.0–8.0)

## 2023-06-19 LAB — POCT WET PREP (WET MOUNT)
Clue Cells Wet Prep Whiff POC: NEGATIVE
Trichomonas Wet Prep HPF POC: ABSENT
WBC, Wet Prep HPF POC: NONE SEEN

## 2023-06-19 NOTE — Progress Notes (Signed)
  Date of Visit: 06/19/2023   SUBJECTIVE:   HPI:  Kristin Daniels presents today for a same day appointment to discuss urinary symptoms.  Has noticed she is urinating more frequently recently. Also notes she is urinating for longer. Denies hesitancy or dysuria. No significant vaginal discharge. Reports recent STI testing. No new partners. Declines pelvic exam today. Did take antibiotics (keflex) at end of December for presumed UTI after doing e visit. Reports previously being referred to urology for frequent UTIs but that office didn't take her insurance, needs new referral placed.  PMHx: history of pituitary tumor, benign schwannoma of L auditory canal, GERD  OBJECTIVE:   BP 114/80   Pulse 98   Ht 5\' 2"  (1.575 m)   Wt 163 lb 3.2 oz (74 kg)   SpO2 100%   BMI 29.85 kg/m  Gen: no acute distress, pleasant cooperative HEENT: normocephalic, atraumatic  Abdomen: soft, nontender to palpation, no masses or organomegaly Back: no CVA tenderness bilaterally  ASSESSMENT/PLAN:   Assessment & Plan Urinary frequency Patient here for eval for UTI due to urinary frequency UA without signs of UTI but given reported symptoms will send for culture. Wet prep and aptima swab also ordered given possibility of vaginitis contributing to symptoms (esp in light of recent antibiotic use). These were obtained via self-swab as she declined pelvic exam, discussed limitations of evaluation without full examination. New referral to urology per patient request    [place] J. Pollie Meyer, MD [facility]

## 2023-06-20 ENCOUNTER — Encounter: Payer: Self-pay | Admitting: Family Medicine

## 2023-06-20 LAB — CERVICOVAGINAL ANCILLARY ONLY
Bacterial Vaginitis (gardnerella): NEGATIVE
Candida Glabrata: NEGATIVE
Candida Vaginitis: NEGATIVE
Chlamydia: NEGATIVE
Comment: NEGATIVE
Comment: NEGATIVE
Comment: NEGATIVE
Comment: NEGATIVE
Comment: NEGATIVE
Comment: NORMAL
Neisseria Gonorrhea: NEGATIVE
Trichomonas: NEGATIVE

## 2023-06-21 LAB — URINE CULTURE: Organism ID, Bacteria: NO GROWTH

## 2023-06-21 NOTE — Assessment & Plan Note (Signed)
Patient here for eval for UTI due to urinary frequency UA without signs of UTI but given reported symptoms will send for culture. Wet prep and aptima swab also ordered given possibility of vaginitis contributing to symptoms (esp in light of recent antibiotic use). These were obtained via self-swab as she declined pelvic exam, discussed limitations of evaluation without full examination. New referral to urology per patient request

## 2023-06-24 ENCOUNTER — Encounter: Payer: Self-pay | Admitting: Student

## 2023-06-25 ENCOUNTER — Other Ambulatory Visit: Payer: BC Managed Care – PPO

## 2023-06-27 ENCOUNTER — Other Ambulatory Visit: Payer: BC Managed Care – PPO

## 2023-06-27 ENCOUNTER — Other Ambulatory Visit: Payer: Self-pay

## 2023-06-27 LAB — PROLACTIN: Prolactin: 14.3 ng/mL

## 2023-06-27 LAB — T4, FREE: Free T4: 1 ng/dL (ref 0.8–1.8)

## 2023-06-27 LAB — T3, FREE: T3, Free: 3 pg/mL (ref 2.3–4.2)

## 2023-06-27 LAB — TSH: TSH: 0.87 m[IU]/L

## 2023-06-30 ENCOUNTER — Encounter: Payer: Self-pay | Admitting: Endocrinology

## 2023-06-30 ENCOUNTER — Ambulatory Visit (INDEPENDENT_AMBULATORY_CARE_PROVIDER_SITE_OTHER): Payer: BC Managed Care – PPO | Admitting: Endocrinology

## 2023-06-30 VITALS — BP 118/80 | HR 75 | Resp 20

## 2023-06-30 DIAGNOSIS — E221 Hyperprolactinemia: Secondary | ICD-10-CM

## 2023-06-30 DIAGNOSIS — E042 Nontoxic multinodular goiter: Secondary | ICD-10-CM | POA: Diagnosis not present

## 2023-06-30 DIAGNOSIS — Z87898 Personal history of other specified conditions: Secondary | ICD-10-CM

## 2023-06-30 DIAGNOSIS — D352 Benign neoplasm of pituitary gland: Secondary | ICD-10-CM

## 2023-06-30 DIAGNOSIS — E041 Nontoxic single thyroid nodule: Secondary | ICD-10-CM

## 2023-06-30 MED ORDER — CABERGOLINE 0.5 MG PO TABS: 0.2500 mg | ORAL_TABLET | ORAL | 4 refills | Status: DC

## 2023-06-30 NOTE — Progress Notes (Signed)
 Outpatient Endocrinology Note Kristin Rut Betterton, MD  06/30/23  Patient's Name: Kristin Daniels    DOB: [birth year]    MRN: [id number]  REASON OF VISIT: Follow up for prolactinoma / hyperprolactinemia / left thyroid nodule.  PCP: Alfredo Martinez, MD  HISTORY OF PRESENT ILLNESS:   Kristin Daniels is a 48 y.o. old female with past medical history listed below, is here for follow up of pituitary concerns / pituitary microadenoma / hyperprolactinemia / left thyroid nodule.    # Pertinent Pituitary History: - Patient was diagnosed with prolactinoma /  and hyperprolactinemia in 2009 with original size of pituitary adenoma of 7 mm initially presented with galactorrhea and headache was evaluated in [place], had seen endocrinologist, had periodic lab work, MRI, started on cabergoline and continued.  She moved to this area, in 2016, she was first seen in endocrinology clinic by Dr. Lucianne Muss in March 2019.  Patient does not know her baseline prolactin level prior to starting cabergoline.  Prolactin was 45 in December 2016.  Around 2018 her primary care provider had restarted cabergoline half tablet once a week.,  In late 2018 she had mild galactorrhea and breast fullness cabergoline was increased to half tablet twice a week.  With a relatively higher level of prolactin she usually have symptoms of breast swelling, fullness and tenderness, along with galactorrhea on expression but no change in menstrual cycles.  Historically in June 2022 she had missed couple of weeks of cabergoline however her prolactin was upper normal 22.7 without any change in symptoms.  In visit in February 2024, she had complaints of breast tenderness and heaviness with occasional breast discharge on expression and was bothersome and wanted to have symptomatic treatments.  At that time she was taking cabergoline 1 mg 2 times a week.  Patient was seen by Dr. Lucianne Muss in May 2024, according to the note she was taking cabergoline 1 mg 2 times a week and  had advised to take 1 mg alternate with 0.5 mg in a week. There was discrepancy between the doses of the cabergoline mentioned on the note and the patient reported.  Patient reports that she was actually taking cabergoline 1 tablet which is 0.5 mg 2 times a week since her visit in May, 2024.Before that she was taking 1 tablet which is 0.5 mg alternating with half tablet which is at 0.25 mg in a week.  Lately she has been taking cabergoline 0.25 mg 2 times a week.  Echocardiogram in February 26, 2023, normal heart valves.  Pituitary adenoma/prolactinoma is being monitored with MRI, she has been following with neurology. MRI brain in October 09, 2022 : IMPRESSION:  8 x 6 mm sellar lesion, unchanged in size and appearance dating back to the brain MRI of 05/31/2019. 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 Cystic pituitary adenoma not excluded.  Prior MRI in June 2023 and January 2021 with a stable pituitary microadenoma.   She reports she has recently seen ophthalmology and had normal peripheral vision.  During her pregnancy when she was not on regular medications she had developed severe diabetes insipidus which was controlled with her restarting treatment and she took initially bromocriptine and subsequently cabergoline during her pregnancy.  # Left thyroid nodule -She was diagnosed with multiple thyroid nodules several years ago on routine exam. In 2013 she had baseline size of left thyroid nodule was 2.8 cm.  Records showed that Needle Aspiration Biopsies of Left Dominant Thyroid Nodule 3 Times, Biopsy had been  Benign consistently Including Afirma Testing.  In December 2021 thyroid nodule was measuring 4.4 cm compared to 3.5 cm in 2018, status post fine-needle aspiration in September 2023 showed scanty but normal follicular epithelium.  She was concerned with the appearance of her neck swelling from the nodule, she underwent radiofrequency ablation by interventional  radiologist on April 08, 2022.  Previously in November 2021 her left-sided nodule was about 3 to 3.5 cm, ultrasound in September 2023 nodule was 4.4 cm.  With the ablation or nodule progressively improved in size and measuring 3.2 cm swelling 70% reduction in volume as of ultrasound in April 2024.  There is a small cyst on the right thyroid lobe.  Ultrasound thyroid in February 21, 2023 left thyroid nodule measuring 3.2 cm unchanged compared to ultrasound in April 2024.  She has normal thyroid function test with normal TSH and low normal free T4 mostly.  She has not been on thyroid medication.  Per records: Her first biopsy was done in 09/2011 which had shown indeterminate cytology and this was confirmed to be benign on Affirma testing, at that time her nodule was 2.8 cm.  Needle aspiration done in 01/2015 indicated she had a 3 cm nodule that previously had been benign; this showed scanty cellular specimen along with colloid and degenerated macrophages In 08/2016 the needle aspiration biopsy showed a benign follicular adenoma and the nodule size was indicated at 3.7 cm  Interval history Patient has been taking cabergoline half tablet /0.25 mg 2 times a week.  She has no galactorrhea.  She continued to have complaints of breast discomfort and fullness.  She had mammogram and ultrasound of the breast in December 2024 with superimposed fibroglandular tissue, negative evidence for malignancy.  She has occasional headache.  No peripheral vision loss.  Recent lab results reviewed with normal prolactin and thyroid function test including free T4 as follows.  She stopped taking multivitamin from the last follow-up visit with a concern of affecting thyroid lab results.   Latest Reference Range & Units 06/27/23 11:07  Prolactin ng/mL 14.3  TSH mIU/L 0.87  Triiodothyronine,Free,Serum 2.3 - 4.2 pg/mL 3.0  T4,Free(Direct) 0.8 - 1.8 ng/dL 1.0   No other complaints today.   REVIEW OF SYSTEMS:  As per  history of present illness.   PAST MEDICAL HISTORY: Past Medical History:  Diagnosis Date   Acid reflux    Colon polyp    Costochondral chest pain 12/01/2020   H. pylori infection    H. pylori infection 03/26/2019   History of seasonal allergies    Hypercholesterolemia    Lumbar radiculopathy, acute 04/10/2021   Lymph node symptom 09/21/2018   Migraines    Oral candidiasis 05/21/2021   Pain of right lower leg 12/01/2020   Pituitary tumor    Thyroid nodule    Vaginal candidiasis 05/21/2021   Vaginal Pap smear, abnormal     PAST SURGICAL HISTORY: Past Surgical History:  Procedure Laterality Date   CESAREAN SECTION     IR RADIOLOGIST EVAL & MGMT  01/11/2022   IR RADIOLOGIST EVAL & MGMT  05/23/2022   IR RADIOLOGIST EVAL & MGMT  09/03/2022   IR RADIOLOGIST EVAL & MGMT  03/19/2023   OTHER SURGICAL HISTORY     hemangio removed at child and had something added to head to help indentation    ALLERGIES: Allergies  Allergen Reactions   Metronidazole Anaphylaxis and Hives   Pollen Extract    Diflucan [Fluconazole] Rash    Broke out in a  rash with hives around her mouth, hands, face.    FAMILY HISTORY:  Family History  Problem Relation Age of Onset   Hypertension Mother    Hypercholesterolemia Mother    Colon polyps Maternal Grandmother    Colon cancer Neg Hx    Esophageal cancer Neg Hx    Stomach cancer Neg Hx    Rectal cancer Neg Hx    BRCA 1/2 Neg Hx    Breast cancer Neg Hx     SOCIAL HISTORY: Social History   Socioeconomic History   Marital status: Single    Spouse name: Not on file   Number of children: 1   Years of education: Not on file   Highest education level: Not on file  Occupational History   Occupation: not working  Tobacco Use   Smoking status: Never   Smokeless tobacco: Never  Vaping Use   Vaping status: Never Used  Substance and Sexual Activity   Alcohol use: Yes    Comment: holidays   Drug use: No   Sexual activity: Yes    Birth  control/protection: Condom  Other Topics Concern   Not on file  Social History Narrative   Right handed   Lives in two story home with son.   Social Drivers of Corporate investment banker Strain: Low Risk  (11/18/2019)   [facility]   Overall Financial Resource Strain (CARDIA)    Difficulty of Paying Living Expenses: Not very hard  Food Insecurity: No Food Insecurity (11/18/2019)   [facility]   Hunger Vital Sign    Worried About Running Out of Food in the Last Year: Never true    Ran Out of Food in the Last Year: Never true  Transportation Needs: No Transportation Needs (11/18/2019)   [facility]   PRAPARE - Transportation    Lack of Transportation (Medical): No    Lack of Transportation (Non-Medical): No  Physical Activity: Inactive (11/18/2019)   [facility] from [facility]   Exercise Vital Sign    Days of Exercise per Week: 0 days    Minutes of Exercise per Session: 0 min  Stress: Not on file  Social Connections: Not on file    MEDICATIONS:  Current Outpatient Medications  Medication Sig Dispense Refill   ALPRAZolam (XANAX) 1 MG tablet Take 1 mg by mouth daily.     ARIPiprazole (ABILIFY) 30 MG tablet Take 30 mg by mouth daily.     cetirizine (ZYRTEC) 10 MG tablet Take 1 tablet (10 mg total) by mouth daily. 90 tablet 3   ciclopirox (PENLAC) 8 % solution Apply topically at bedtime. Apply over nail and surrounding skin. Apply daily over previous coat. After seven (7) days, may remove with alcohol and continue cycle. 6.6 mL 0   clindamycin (CLEOCIN-T) 1 % external solution Apply topically 2 (two) times daily as needed. Discuss on next visit before next refill. 30 mL 2   clindamycin-benzoyl peroxide (BENZACLIN) gel Apply topically 2 (two) times daily. 25 g 0   desonide (DESOWEN) 0.05 %  cream Apply topically 2 (two) times daily. Do not use for more than 4 weeks 30 g 0   desonide (DESOWEN) 0.05 % ointment Apply 1 Application topically 2 (two) times daily. 15 g 0   FIBER PO Take by mouth.     fluticasone (FLONASE) 50 MCG/ACT nasal spray Place 2 sprays into both nostrils daily. 16 g 6  MAGNESIUM PO Take by mouth.     Multiple Vitamin (MULTIVITAMIN) tablet Take 1 tablet by mouth daily.     nortriptyline (PAMELOR) 50 MG capsule TAKE 2 CAPSULES (100 MG TOTAL) BY MOUTH AT BEDTIME. 180 capsule 2   Olopatadine HCl 0.2 % SOLN Apply 1 drop to eye daily. 1 Bottle 0   Omega-3 Fatty Acids (FISH OIL PO) Take by mouth.     omeprazole (PRILOSEC) 40 MG capsule Take 1 capsule (40 mg total) by mouth daily. 30 capsule 3   Riboflavin-Magnesium-Feverfew 100-90-25 MG TABS Take 100 mg by mouth daily. 30 tablet 0   rizatriptan (MAXALT) 10 MG tablet Take 1 tablet (10 mg total) by mouth as needed for migraine. May repeat after 2 hours if needed.  Maximum 2 tablets in 24 hours. 10 tablet 5   tiZANidine (ZANAFLEX) 2 MG tablet Take 1 tablet (2 mg total) by mouth at bedtime. 15 tablet 0   topiramate (TOPAMAX) 25 MG tablet Take 1 tablet (25 mg total) by mouth at bedtime. 30 tablet 5   ZINC SULFATE-VITAMIN C MT Use as directed in the mouth or throat.     zolpidem (AMBIEN) 10 MG tablet Take 10 mg by mouth at bedtime as needed.     cabergoline (DOSTINEX) 0.5 MG tablet Take 0.5 tablets (0.25 mg total) by mouth 2 (two) times a week. 12 tablet 4   No current facility-administered medications for this visit.    PHYSICAL EXAM: Vitals:   06/30/23 1033  BP: 118/80  Pulse: 75  Resp: 20  SpO2: 96%     There is no height or weight on file to calculate BMI.   General: Well developed, well nourished female in no apparent distress. No cushingoid and acromegalic appearance HEENT: AT/[place], no external lesions. Hearing intact to the spoken word Eyes: EOMI. Conjunctiva clear and no icterus. Visual Field by  confrontation grossly intact Neck: Trachea midline, neck supple without appreciable thyromegaly or lymphadenopathy and left palpable thyroid nodule + Lungs: Clear to auscultation, no wheeze. Respirations not labored Heart: S1S2, Regular in rate and rhythm.  No murmur. Abdomen: Soft, non tender Neurologic: Alert, oriented, normal speech, deep tendon biceps reflexes normal,  no gross focal neurological deficit Extremities: No pedal pitting edema, no tremors of outstretched hands Skin: Warm, color good.  Psychiatric: Does not appear depressed or anxious  PERTINENT HISTORIC LABORATORY AND IMAGING STUDIES:  All pertinent laboratory results were reviewed. Please see HPI also for further details.    ASSESSMENT / PLAN  1. Hyperprolactinemia (HCC)   2. History of pituitary tumor   3. Multiple thyroid nodules   4. Left thyroid nodule   5. 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 Cystic pituitary adenoma not excluded. -Patient has been on cabergoline for several years, seems like it was on and off however restarted in 2018. -She has normal prolactin in last few years. -She gets symptoms of breast discomfort, fullness and possibly galactorrhea on low-dose of cabergoline or higher normal range of prolactin. -She is currently taking cabergoline 0.25 mg / 1/2 tablet 2 times a week Sunday and Thursday since around August 2024.  Prolactin has remained in the mid normal range.  Recent prolactin normal.  # Multinodular goiter /left thyroid nodule -Left thyroid nodule measuring more than 4 cm, status post multiple FNAs in the past including Afirma with  benign cytology last FNA was in September 2023.  Status post radiofrequency ablation in December 2023 of this nodule and that has been decreasing the size was measured 3.2 cm with  70% decrease in volume and ultrasound in April 2024 and August 2024.  There is a small cyst in her right thyroid lobe. -Will monitor left thyroid nodule with serial ultrasound. -Send thyroid function test normal.  She has not been on thyroid medication.    Plan: -Continue cabergoline 0.25 mg 2 times a week.  -No thyroid medication is required.  Will continue to monitor thyroid function test intermittently.  Check thyroid function test prior to follow-up visit. Patient is asked to stop multivitamin to avoid effect of biotin 1 week prior to thyroid lab. -Check prolactin prior to follow-up visit.  Diagnoses and all orders for this visit:  Hyperprolactinemia (HCC) -     cabergoline (DOSTINEX) 0.5 MG tablet; Take 0.5 tablets (0.25 mg total) by mouth 2 (two) times a week. -     Prolactin  History of pituitary tumor -     cabergoline (DOSTINEX) 0.5 MG tablet; Take 0.5 tablets (0.25 mg total) by mouth 2 (two) times a week.  Multiple thyroid nodules -     T4, free -     TSH  Left thyroid nodule  Pituitary adenoma (HCC)      DISPOSITION Follow up in clinic in 6  months suggested.  All questions answered and patient verbalized understanding of the plan.  Kristin Alizea Pell, MD [facility] End[facility]inology [facility] [address] [place], [address] [place], [place] [address] Phone # [phone]  At least part of this note was generated using voice recognition software. Inadvertent word errors may have occurred, which were not recognized during the proofreading process.

## 2023-07-01 ENCOUNTER — Encounter: Payer: PRIVATE HEALTH INSURANCE | Admitting: Urology

## 2023-07-02 ENCOUNTER — Ambulatory Visit (HOSPITAL_BASED_OUTPATIENT_CLINIC_OR_DEPARTMENT_OTHER): Payer: BC Managed Care – PPO | Admitting: Cardiology

## 2023-07-02 ENCOUNTER — Encounter: Payer: PRIVATE HEALTH INSURANCE | Admitting: Urology

## 2023-07-18 ENCOUNTER — Ambulatory Visit: Payer: PRIVATE HEALTH INSURANCE | Admitting: Urology

## 2023-07-18 ENCOUNTER — Encounter: Payer: Self-pay | Admitting: Urology

## 2023-07-18 VITALS — BP 113/77 | HR 102 | Ht 62.0 in | Wt 165.0 lb

## 2023-07-18 DIAGNOSIS — R350 Frequency of micturition: Secondary | ICD-10-CM | POA: Diagnosis not present

## 2023-07-18 DIAGNOSIS — R339 Retention of urine, unspecified: Secondary | ICD-10-CM

## 2023-07-18 LAB — URINALYSIS, ROUTINE W REFLEX MICROSCOPIC
Bilirubin, UA: NEGATIVE
Glucose, UA: NEGATIVE
Ketones, UA: NEGATIVE
Leukocytes,UA: NEGATIVE
Nitrite, UA: NEGATIVE
Protein,UA: NEGATIVE
RBC, UA: NEGATIVE
Specific Gravity, UA: 1.01 (ref 1.005–1.030)
Urobilinogen, Ur: 0.2 mg/dL (ref 0.2–1.0)
pH, UA: 7 (ref 5.0–7.5)

## 2023-07-18 LAB — BLADDER SCAN AMB NON-IMAGING

## 2023-07-18 NOTE — Progress Notes (Signed)
 Assessment: 1. Urinary frequency   2. Incomplete bladder emptying     Plan: I personally reviewed the patient's chart including provider notes, and lab results. I do not see any evidence of recent UTIs. She has had evidence of hematuria on dipstick UA's without microscopic evaluation.  I discussed these findings with her. I discussed timed and double voiding to ensure adequate bladder emptying. She does have some symptoms of an overactive bladder.  Management discussed with the patient.  Bladder diet sheet provided. Return to office in 6 weeks  Chief Complaint:  Chief Complaint  Patient presents with   Urinary Frequency    History of Present Illness:  Kristin Daniels is a 48 y.o. female who is seen in consultation from Alfredo Martinez, MD for evaluation of urinary frequency and history of UTIs. She reports that she has had abnormal urinalyses. Review of her dipstick UA's show trace to moderate blood on several occasions.  Urine cx results: 10/23 50-100K Staph 6/24 <10K colonies 8/24 No growth 2/25 No growth  She has symptoms of urinary frequency, voiding every 2 hours.  She has nocturia 1-2 times per night.  She does report occasional urgency and urge incontinence.  She states that she voids for a long time with some intermittency and her stream.  She does feel like she empties her bladder completely.  No dysuria or gross hematuria.  No history of stones.  No problems with constipation.  Past Medical History:  Past Medical History:  Diagnosis Date   Acid reflux    Colon polyp    Costochondral chest pain 12/01/2020   H. pylori infection    H. pylori infection 03/26/2019   History of seasonal allergies    Hypercholesterolemia    Lumbar radiculopathy, acute 04/10/2021   Lymph node symptom 09/21/2018   Migraines    Oral candidiasis 05/21/2021   Pain of right lower leg 12/01/2020   Pituitary tumor    Thyroid nodule    Vaginal candidiasis 05/21/2021   Vaginal Pap  smear, abnormal     Past Surgical History:  Past Surgical History:  Procedure Laterality Date   CESAREAN SECTION     IR RADIOLOGIST EVAL & MGMT  01/11/2022   IR RADIOLOGIST EVAL & MGMT  05/23/2022   IR RADIOLOGIST EVAL & MGMT  09/03/2022   IR RADIOLOGIST EVAL & MGMT  03/19/2023   OTHER SURGICAL HISTORY     hemangio removed at child and had something added to head to help indentation    Allergies:  Allergies  Allergen Reactions   Metronidazole Anaphylaxis and Hives   Pollen Extract    Diflucan [Fluconazole] Rash    Broke out in a rash with hives around her mouth, hands, face.    Family History:  Family History  Problem Relation Age of Onset   Hypertension Mother    Hypercholesterolemia Mother    Colon polyps Maternal Grandmother    Colon cancer Neg Hx    Esophageal cancer Neg Hx    Stomach cancer Neg Hx    Rectal cancer Neg Hx    BRCA 1/2 Neg Hx    Breast cancer Neg Hx     Social History:  Social History   Tobacco Use   Smoking status: Never   Smokeless tobacco: Never  Vaping Use   Vaping status: Never Used  Substance Use Topics   Alcohol use: Yes    Comment: holidays   Drug use: No    Review of symptoms:  Constitutional:  Negative  for unexplained weight loss, night sweats, fever, chills ENT:  Negative for nose bleeds, sinus pain, painful swallowing CV:  Negative for chest pain, shortness of breath, exercise intolerance, palpitations, loss of consciousness Resp:  Negative for cough, wheezing, shortness of breath GI:  Negative for nausea, vomiting, diarrhea, bloody stools GU:  Positives noted in HPI; otherwise negative for gross hematuria, dysuria Neuro:  Negative for seizures, poor balance, limb weakness, slurred speech Psych:  Negative for lack of energy, depression, anxiety Endocrine:  Negative for polydipsia, polyuria, symptoms of hypoglycemia (dizziness, hunger, sweating) Hematologic:  Negative for anemia, purpura, petechia, prolonged or excessive  bleeding, use of anticoagulants  Allergic:  Negative for difficulty breathing or choking as a result of exposure to anything; no shellfish allergy; no allergic response (rash/itch) to materials, foods  Physical exam: BP 113/77   Pulse (!) 102   Ht 5\' 2"  (1.575 m)   Wt 165 lb (74.8 kg)   BMI 30.18 kg/m  GENERAL APPEARANCE:  Well appearing, well developed, well nourished, NAD HEENT: Atraumatic, Normocephalic, oropharynx clear. NECK: Supple without lymphadenopathy or thyromegaly. LUNGS: Clear to auscultation bilaterally. HEART: Regular Rate and Rhythm without murmurs, gallops, or rubs. ABDOMEN: Soft, non-tender, No Masses. EXTREMITIES: Moves all extremities well.  Without clubbing, cyanosis, or edema. NEUROLOGIC:  Alert and oriented x 3, normal gait, CN II-XII grossly intact.  MENTAL STATUS:  Appropriate. BACK:  Non-tender to palpation.  No CVAT SKIN:  Warm, dry and intact.    Results: U/A: negative  PVR:  181 ml

## 2023-08-31 NOTE — Progress Notes (Signed)
 Cardiology Office Note:    Date:  09/04/2023   ID:  Kristin Daniels, DOB [birth year], MRN [id number]  PCP:  Ernestina Headland, MD  Cardiologist:  None  Electrophysiologist:  None   Referring MD: Arn Lane, MD   Chief Complaint  Patient presents with   Chest Pain    History of Present Illness:    Kristin Daniels is a 48 y.o. female with a hx of prolactinoma, hyperlipidemia who is referred by Dr. Grandville Lax for evaluation of chest pain.  She reports she has been having intermittent chest pain for years.  Occurs about once per month, can last for days.  Feels dull aching pain all over her chest.  Reports typically about 6 out of 10 in intensity.  No clear relationship with activity.  Reports has not been exercising recently.  Does report she has had some shortness of breath with exertion.  Reports some lightheadedness but denies any syncope.  No lower extremity edema.  She reports having palpitations 1-2 times per week but just lasts for few seconds.  No smoking history.  No history of heart disease in immediate family.  Echocardiogram 02/2023 showed EF 55 to 60%, normal RV function, no significant valvular disease.  Past Medical History:  Diagnosis Date   Acid reflux    Colon polyp    Costochondral chest pain 12/01/2020   H. pylori infection    H. pylori infection 03/26/2019   History of seasonal allergies    Hypercholesterolemia    Lumbar radiculopathy, acute 04/10/2021   Lymph node symptom 09/21/2018   Migraines    Oral candidiasis 05/21/2021   Pain of right lower leg 12/01/2020   Pituitary tumor    Thyroid  nodule    Vaginal candidiasis 05/21/2021   Vaginal Pap smear, abnormal     Past Surgical History:  Procedure Laterality Date   CESAREAN SECTION     IR RADIOLOGIST EVAL & MGMT  01/11/2022   IR RADIOLOGIST EVAL & MGMT  05/23/2022   IR RADIOLOGIST EVAL & MGMT  09/03/2022   IR RADIOLOGIST EVAL & MGMT  03/19/2023   OTHER SURGICAL HISTORY     hemangio removed at child and had  something added to head to help indentation    Current Medications: Current Meds  Medication Sig   ALPRAZolam (XANAX) 1 MG tablet Take 1 mg by mouth daily.   ARIPiprazole (ABILIFY) 30 MG tablet Take 30 mg by mouth daily.   cabergoline  (DOSTINEX ) 0.5 MG tablet Take 0.5 tablets (0.25 mg total) by mouth 2 (two) times a week.   cetirizine  (ZYRTEC ) 10 MG tablet Take 1 tablet (10 mg total) by mouth daily.   ciclopirox  (PENLAC ) 8 % solution Apply topically at bedtime. Apply over nail and surrounding skin. Apply daily over previous coat. After seven (7) days, may remove with alcohol and continue cycle.   clindamycin  (CLEOCIN -T) 1 % external solution Apply topically 2 (two) times daily as needed. Discuss on next visit before next refill.   clindamycin -benzoyl peroxide (BENZACLIN) gel Apply topically 2 (two) times daily.   desonide  (DESOWEN ) 0.05 % cream Apply topically 2 (two) times daily. Do not use for more than 4 weeks   desonide  (DESOWEN ) 0.05 % ointment Apply 1 Application topically 2 (two) times daily.   FIBER PO Take by mouth.   fluticasone  (FLONASE ) 50 MCG/ACT nasal spray Place 2 sprays into both nostrils daily.   MAGNESIUM  PO Take by mouth.   metoprolol  tartrate (LOPRESSOR ) 100 MG tablet Take 2 hours prior to CT  Multiple Vitamin (MULTIVITAMIN) tablet Take 1 tablet by mouth daily.   nortriptyline  (PAMELOR ) 50 MG capsule TAKE 2 CAPSULES (100 MG TOTAL) BY MOUTH AT BEDTIME.   Olopatadine  HCl 0.2 % SOLN Apply 1 drop to eye daily.   Omega-3 Fatty Acids (FISH OIL PO) Take by mouth.   omeprazole  (PRILOSEC) 40 MG capsule Take 1 capsule (40 mg total) by mouth daily.   Riboflavin -Magnesium -Feverfew 100-90-25 MG TABS Take 100 mg by mouth daily.   rizatriptan  (MAXALT ) 10 MG tablet Take 1 tablet (10 mg total) by mouth as needed for migraine. May repeat after 2 hours if needed.  Maximum 2 tablets in 24 hours.   tiZANidine  (ZANAFLEX ) 2 MG tablet Take 1 tablet (2 mg total) by mouth at bedtime.    topiramate  (TOPAMAX ) 25 MG tablet Take 1 tablet (25 mg total) by mouth at bedtime.   ZINC SULFATE-VITAMIN C MT Use as directed in the mouth or throat.   zolpidem (AMBIEN) 10 MG tablet Take 10 mg by mouth at bedtime as needed.     Allergies:   Metronidazole , Pollen extract, and Diflucan  [fluconazole ]   Social History   Socioeconomic History   Marital status: Single    Spouse name: Not on file   Number of children: 1   Years of education: Not on file   Highest education level: Not on file  Occupational History   Occupation: not working  Tobacco Use   Smoking status: Never   Smokeless tobacco: Never  Vaping Use   Vaping status: Never Used  Substance and Sexual Activity   Alcohol use: Yes    Comment: holidays   Drug use: No   Sexual activity: Yes    Birth control/protection: Condom  Other Topics Concern   Not on file  Social History Narrative   Right handed   Lives in two story home with son.   Social Drivers of Corporate investment banker Strain: Low Risk  (11/18/2019)   [facility]   Overall Financial Resource Strain (CARDIA)    Difficulty of Paying Living Expenses: Not very hard  Food Insecurity: No Food Insecurity (11/18/2019)   [facility]   Hunger Vital Sign    Worried About Running Out of Food in the Last Year: Never true    Ran Out of Food in the Last Year: Never true  Transportation Needs: No Transportation Needs (11/18/2019)   Received from [facility], [facility]   PRAPARE - Transportation    Lack of Transportation (Medical): No    Lack of Transportation (Non-Medical): No  Physical Activity: Inactive (11/18/2019)   Received from [facility], [facility]   Exercise Vital Sign    Days of Exercise per Week: 0 days    Minutes of Exercise per Session: 0 min  Stress: Not on file  Social Connections: Not on  file     Family History: The patient's family history includes Colon polyps in her maternal grandmother; Hypercholesterolemia in her mother; Hypertension in her mother. There is no history of Colon cancer, Esophageal cancer, Stomach cancer, Rectal cancer, BRCA 1/2, or Breast cancer.  ROS:   Please see the history of present illness.     All other systems reviewed and are negative.  EKGs/Labs/Other Studies Reviewed:    The following studies were reviewed today:   EKG:   09/04/2023: Normal sinus rhythm, rate 85, no ST abnormalities  Recent Labs: 01/20/2023: ALT 15; BUN 13; Creatinine, Ser 0.63; Hemoglobin 12.0;  Platelets 317; Potassium 4.8; Sodium 141 06/27/2023: TSH 0.87  Recent Lipid Panel    Component Value Date/Time   CHOL 210 (H) 05/21/2021 1225   TRIG 110 05/21/2021 1225   HDL 46 05/21/2021 1225   CHOLHDL 4.6 (H) 05/21/2021 1225   LDLCALC 144 (H) 05/21/2021 1225    Physical Exam:    VS:  BP 119/83 (BP Location: Left Arm, Patient Position: Sitting, Cuff Size: Normal)   Pulse 85   Ht 5\' 2"  (1.575 m)   Wt 164 lb 12.8 oz (74.8 kg)   SpO2 98%   BMI 30.14 kg/m     Wt Readings from Last 3 Encounters:  09/04/23 164 lb 12.8 oz (74.8 kg)  09/01/23 165 lb (74.8 kg)  07/18/23 165 lb (74.8 kg)     GEN:  Well nourished, well developed in no acute distress HEENT: Normal NECK: No JVD; No carotid bruits LYMPHATICS: No lymphadenopathy CARDIAC: RRR, no murmurs, rubs, gallops RESPIRATORY:  Clear to auscultation without rales, wheezing or rhonchi  ABDOMEN: Soft, non-tender, non-distended MUSCULOSKELETAL:  No edema; No deformity  SKIN: Warm and dry NEUROLOGIC:  Alert and oriented x 3 PSYCHIATRIC:  Normal affect   ASSESSMENT:    1. Precordial pain   2. DOE (dyspnea on exertion)   3. Hyperlipidemia, unspecified hyperlipidemia type    PLAN:    Chest pain/DOE: Chest pain is atypical in description but also can dyspnea exertion that could represent anginal equivalent.  Does  have CAD risk factor (hyperlipidemia).  Echocardiogram 02/2023 showed EF 55 to 60%, normal RV function, no significant valvular disease. Recommend coronary CTA to rule out obstructive CAD.  Will give Lopressor  100 mg prior to study.  Hyperlipidemia: LDL 144 at 05/21/2021.  Will follow-up results of coronary CTA to guide how aggressive to be in lowering cholesterol.  Will update lipid panel  Prolactinoma: Follows with endocrinology.  On cabergoline   RTC in 6 months  Medication Adjustments/Labs and Tests Ordered: Current medicines are reviewed at length with the patient today.  Concerns regarding medicines are outlined above.  Orders Placed This Encounter  Procedures   CT CORONARY MORPH W/CTA COR W/SCORE W/CA W/CM &/OR WO/CM   CBC w/Diff   Lipid panel   Comprehensive Metabolic Panel (CMET)   HgB A1c   EKG 12-Lead   Meds ordered this encounter  Medications   metoprolol  tartrate (LOPRESSOR ) 100 MG tablet    Sig: Take 2 hours prior to CT    Dispense:  1 tablet    Refill:  0    Patient Instructions  Medication Instructions:  Continue current medications *If you need a refill on your cardiac medications before your next appointment, please call your pharmacy*  Lab Work: Cmet, cbc, lipid panel,  A1c today If you have labs (blood work) drawn today and your tests are completely normal, you will receive your results only by: MyChart Message (if you have MyChart) OR A paper copy in the mail If you have any lab test that is abnormal or we need to change your treatment, we will call you to review the results.  Testing/Procedures:   Your cardiac CT will be scheduled at one of the below locations:   [facility] [address] [place], [place] [address] [phone]   [facility] and V[facility] Tower [address]  [place], [place] [address] Opening September 01, 2023  If scheduled at [facility], please arrive at the [facility] and Children's Entrance  (Entrance C2) of [facility] 30 minutes prior to test start time. You can use the FREE valet parking offered at entrance C (encouraged to control the heart rate for the test)  Proceed to the [facility] Radiology Department (first floor) to check-in and test prep.   All radiology patients and guests should use entrance C2 at [facility], accessed from [facility], even though the hospital's physical address listed is [address]    If scheduled at the Heart and Vascular Tower at [address], please enter the parking lot using the [address] entrance and use the FREE valet service at the patient drop-off area. Enter the buidling and check-in with registration on the main floor.   Please follow these instructions carefully (unless otherwise directed):  An IV will be required for this test and Nitroglycerin will be given.    On the Night Before the Test: Be sure to Drink plenty of water. Do not consume any caffeinated/decaffeinated beverages or chocolate 12 hours prior to your test. Do not take any antihistamines 12 hours prior to your test.  On the Day of the Test: Drink plenty of water until 1 hour prior to the test. Do not eat any food 1 hour prior to test. You may take your regular medications prior to the test.  Take metoprolol  (Lopressor ) 100mg  two hours prior to test. If you take Furosemide/Hydrochlorothiazide/Spironolactone/Chlorthalidone, please HOLD on the morning of the test. Patients who wear a continuous glucose monitor MUST remove the device prior to scanning. FEMALES- please wear underwire-free bra if available, avoid dresses & tight clothing  After the Test: Drink plenty of water. After receiving IV contrast, you may experience a mild flushed feeling. This is normal. On occasion, you may experience a mild rash up to 24 hours after the test. This is not dangerous. If this occurs, you can take Benadryl 25 mg, Zyrtec ,  Claritin, or Allegra and increase your fluid intake. (Patients taking Tikosyn should avoid Benadryl, and may take Zyrtec , Claritin, or Allegra) If you experience trouble breathing, this can be serious. If it is severe call 911 IMMEDIATELY. If it is mild, please call our office.  We will call to schedule your test 2-4 weeks out understanding that some insurance companies will need an authorization prior to the service being performed.   For more information and frequently asked questions, please visit our website : [email or web address]  For non-scheduling related questions, please contact the cardiac imaging nurse navigator should you have any questions/concerns: Cardiac Imaging Nurse Navigators Direct Office Dial: [phone]   For scheduling needs, including cancellations and rescheduling, please call [place], [phone].   Follow-Up: At [facility], you and your health needs are our priority.  As part of our continuing mission to provide you with exceptional heart care, our providers are all part of one team.  This team includes your primary Cardiologist (physician) and Advanced Practice Providers or APPs (Physician Assistants and Nurse Practitioners) who all work together to provide you with the care you need, when you need it.  Your next appointment:   6 month(s)  Provider:   Dr. Alda Amas  We recommend signing up for the patient portal called "MyChart".  Sign up information is provided on this After Visit Summary.  MyChart is used to connect with patients for Virtual Visits (Telemedicine).  Patients are able to view lab/test results, encounter notes, upcoming appointments, etc.  Non-urgent messages can be sent to your provider as well.   To learn more about what you  can do with MyChart, go to [email or web address].   Other Instructions none       Signed, Wendie Hamburg, MD  09/04/2023 5:48 PM    [facility]

## 2023-09-01 ENCOUNTER — Encounter: Payer: Self-pay | Admitting: Urology

## 2023-09-01 ENCOUNTER — Ambulatory Visit (INDEPENDENT_AMBULATORY_CARE_PROVIDER_SITE_OTHER): Admitting: Urology

## 2023-09-01 VITALS — BP 135/83 | HR 87 | Ht 62.0 in | Wt 165.0 lb

## 2023-09-01 DIAGNOSIS — R350 Frequency of micturition: Secondary | ICD-10-CM | POA: Diagnosis not present

## 2023-09-01 DIAGNOSIS — R339 Retention of urine, unspecified: Secondary | ICD-10-CM

## 2023-09-01 LAB — URINALYSIS, ROUTINE W REFLEX MICROSCOPIC
Bilirubin, UA: NEGATIVE
Glucose, UA: NEGATIVE
Ketones, UA: NEGATIVE
Leukocytes,UA: NEGATIVE
Nitrite, UA: NEGATIVE
Protein,UA: NEGATIVE
Specific Gravity, UA: 1.02 (ref 1.005–1.030)
Urobilinogen, Ur: 0.2 mg/dL (ref 0.2–1.0)
pH, UA: 6 (ref 5.0–7.5)

## 2023-09-01 LAB — MICROSCOPIC EXAMINATION

## 2023-09-01 LAB — BLADDER SCAN AMB NON-IMAGING

## 2023-09-01 NOTE — Progress Notes (Signed)
 Assessment: 1. Urinary frequency   2. Incomplete bladder emptying     Plan: Continue timed and double voiding to ensure adequate bladder emptying. Continue bladder diet  I discussed the option of pelvic floor physical therapy to see if this may improve her symptoms.  She is interested in pursuing this.  I will put in a referral. Return to office in 3 months  Chief Complaint:  Chief Complaint  Patient presents with   Urinary Frequency    History of Present Illness:  Kristin Daniels is a 47 y.o. female who is seen for further evaluation of urinary frequency and history of UTIs. She reports that she has had abnormal urinalyses. Review of her dipstick UA's show trace to moderate blood on several occasions.  Urine cx results: 10/23 50-100K Staph 6/24 <10K colonies 8/24 No growth 2/25 No growth  She has symptoms of urinary frequency, voiding every 2 hours.  She has nocturia 1-2 times per night.  She does report occasional urgency and urge incontinence.  She states that she voids for a long time with some intermittency and her stream.  She does feel like she empties her bladder completely.  No dysuria or gross hematuria.  No history of stones.  No problems with constipation. PVR = 181 ml.  She today for follow-up.  She continues to have some daytime frequency which she associates with her fluid intake.  She has nocturia 1-2 times.  She continues to have some intermittent stream.  She feels like she empties her bladder more completely.  No dysuria or gross hematuria.  Portions of the above documentation were copied from a prior visit for review purposes only.   Past Medical History:  Past Medical History:  Diagnosis Date   Acid reflux    Colon polyp    Costochondral chest pain 12/01/2020   H. pylori infection    H. pylori infection 03/26/2019   History of seasonal allergies    Hypercholesterolemia    Lumbar radiculopathy, acute 04/10/2021   Lymph node symptom 09/21/2018    Migraines    Oral candidiasis 05/21/2021   Pain of right lower leg 12/01/2020   Pituitary tumor    Thyroid  nodule    Vaginal candidiasis 05/21/2021   Vaginal Pap smear, abnormal     Past Surgical History:  Past Surgical History:  Procedure Laterality Date   CESAREAN SECTION     IR RADIOLOGIST EVAL & MGMT  01/11/2022   IR RADIOLOGIST EVAL & MGMT  05/23/2022   IR RADIOLOGIST EVAL & MGMT  09/03/2022   IR RADIOLOGIST EVAL & MGMT  03/19/2023   OTHER SURGICAL HISTORY     hemangio removed at child and had something added to head to help indentation    Allergies:  Allergies  Allergen Reactions   Metronidazole  Anaphylaxis and Hives   Pollen Extract    Diflucan  [Fluconazole ] Rash    Broke out in a rash with hives around her mouth, hands, face.    Family History:  Family History  Problem Relation Age of Onset   Hypertension Mother    Hypercholesterolemia Mother    Colon polyps Maternal Grandmother    Colon cancer Neg Hx    Esophageal cancer Neg Hx    Stomach cancer Neg Hx    Rectal cancer Neg Hx    BRCA 1/2 Neg Hx    Breast cancer Neg Hx     Social History:  Social History   Tobacco Use   Smoking status: Never   Smokeless  tobacco: Never  Vaping Use   Vaping status: Never Used  Substance Use Topics   Alcohol use: Yes    Comment: holidays   Drug use: No    ROS: Constitutional:  Negative for fever, chills, weight loss CV: Negative for chest pain, previous MI, hypertension Respiratory:  Negative for shortness of breath, wheezing, sleep apnea, frequent cough GI:  Negative for nausea, vomiting, bloody stool, GERD  Physical exam: BP 135/83   Pulse 87   Ht 5\' 2"  (1.575 m)   Wt 165 lb (74.8 kg)   BMI 30.18 kg/m  GENERAL APPEARANCE:  Well appearing, well developed, well nourished, NAD HEENT:  Atraumatic, normocephalic, oropharynx clear NECK:  Supple without lymphadenopathy or thyromegaly ABDOMEN:  Soft, non-tender, no masses EXTREMITIES:  Moves all extremities well,  without clubbing, cyanosis, or edema NEUROLOGIC:  Alert and oriented x 3, normal gait, CN II-XII grossly intact MENTAL STATUS:  appropriate BACK:  Non-tender to palpation, No CVAT SKIN:  Warm, dry, and intact  Results: Results for orders placed or performed in visit on 09/01/23 (from the past 24 hours)  Urinalysis, Routine w reflex microscopic     Status: Abnormal   Collection Time: 09/01/23 12:00 AM  Result Value Ref Range   Specific Gravity, UA 1.020 1.005 - 1.030   pH, UA 6.0 5.0 - 7.5   Color, UA Yellow Yellow   Appearance Ur Clear Clear   Leukocytes,UA Negative Negative   Protein,UA Negative Negative/Trace   Glucose, UA Negative Negative   Ketones, UA Negative Negative   RBC, UA Trace (A) Negative   Bilirubin, UA Negative Negative   Urobilinogen, Ur 0.2 0.2 - 1.0 mg/dL   Nitrite, UA Negative Negative   Microscopic Examination See below:    Narrative   Performed at:  [facility]  Urology [facility] [address], [place], [place]  [id number] Lab Director: Estill Hemming [place], Phone:  [phone]  Microscopic Examination     Status: None   Collection Time: 09/01/23 12:00 AM   Urine  Result Value Ref Range   WBC, UA 0-5 0 - 5 /hpf   RBC, Urine 0-2 0 - 2 /hpf   Epithelial Cells (non renal) 0-10 0 - 10 /hpf   Bacteria, UA Few None seen/Few   Narrative   Performed at:  01 [facility]  Urology [facility] [address], [place], [place]  [id number] Lab Director: Estill Hemming [place], Phone:  [phone]     PVR = 18 ml

## 2023-09-03 ENCOUNTER — Encounter (HOSPITAL_BASED_OUTPATIENT_CLINIC_OR_DEPARTMENT_OTHER): Payer: Self-pay

## 2023-09-04 ENCOUNTER — Ambulatory Visit: Payer: BC Managed Care – PPO | Attending: Cardiology | Admitting: Cardiology

## 2023-09-04 ENCOUNTER — Encounter: Payer: Self-pay | Admitting: Cardiology

## 2023-09-04 VITALS — BP 119/83 | HR 85 | Ht 62.0 in | Wt 164.8 lb

## 2023-09-04 DIAGNOSIS — R072 Precordial pain: Secondary | ICD-10-CM

## 2023-09-04 DIAGNOSIS — E785 Hyperlipidemia, unspecified: Secondary | ICD-10-CM | POA: Diagnosis not present

## 2023-09-04 DIAGNOSIS — R0609 Other forms of dyspnea: Secondary | ICD-10-CM | POA: Diagnosis not present

## 2023-09-04 LAB — LIPID PANEL

## 2023-09-04 MED ORDER — METOPROLOL TARTRATE 100 MG PO TABS: ORAL_TABLET | ORAL | 0 refills | Status: AC

## 2023-09-04 NOTE — Patient Instructions (Signed)
 Medication Instructions:  Continue current medications *If you need a refill on your cardiac medications before your next appointment, please call your pharmacy*  Lab Work: Cmet, cbc, lipid panel,  A1c today If you have labs (blood work) drawn today and your tests are completely normal, you will receive your results only by: MyChart Message (if you have MyChart) OR A paper copy in the mail If you have any lab test that is abnormal or we need to change your treatment, we will call you to review the results.  Testing/Procedures:   Your cardiac CT will be scheduled at one of the below locations:   [facility] [address] [place], [place] [address] [phone]   Jeralene Mom. [facility] and Vascular Tower [address]  [place], [place] [address] Opening September 01, 2023  If scheduled at [facility], please arrive at the [facility] and Children's Entrance (Entrance C2) of [facility] 30 minutes prior to test start time. You can use the FREE valet parking offered at entrance C (encouraged to control the heart rate for the test)  Proceed to the [facility] Radiology Department (first floor) to check-in and test prep.   All radiology patients and guests should use entrance C2 at [facility], accessed from [facility], even though the hospital's physical address listed is [address]    If scheduled at the Heart and Vascular Tower at [address], please enter the parking lot using the [address] entrance and use the FREE valet service at the patient drop-off area. Enter the buidling and check-in with registration on the main floor.   Please follow these instructions carefully (unless otherwise directed):  An IV will be required for this test and Nitroglycerin will be given.    On the Night Before the Test: Be sure to Drink plenty of water. Do not consume any caffeinated/decaffeinated beverages or chocolate 12  hours prior to your test. Do not take any antihistamines 12 hours prior to your test.  On the Day of the Test: Drink plenty of water until 1 hour prior to the test. Do not eat any food 1 hour prior to test. You may take your regular medications prior to the test.  Take metoprolol  (Lopressor ) 100mg  two hours prior to test. If you take Furosemide/Hydrochlorothiazide/Spironolactone/Chlorthalidone, please HOLD on the morning of the test. Patients who wear a continuous glucose monitor MUST remove the device prior to scanning. FEMALES- please wear underwire-free bra if available, avoid dresses & tight clothing  After the Test: Drink plenty of water. After receiving IV contrast, you may experience a mild flushed feeling. This is normal. On occasion, you may experience a mild rash up to 24 hours after the test. This is not dangerous. If this occurs, you can take Benadryl 25 mg, Zyrtec , Claritin, or Allegra and increase your fluid intake. (Patients taking Tikosyn should avoid Benadryl, and may take Zyrtec , Claritin, or Allegra) If you experience trouble breathing, this can be serious. If it is severe call 911 IMMEDIATELY. If it is mild, please call our office.  We will call to schedule your test 2-4 weeks out understanding that some insurance companies will need an authorization prior to the service being performed.   For more information and frequently asked questions, please visit our website : [email or web address]  For non-scheduling related questions, please contact the cardiac imaging nurse navigator should you have any questions/concerns: Cardiac Imaging Nurse Navigators Direct Office Dial: [phone]   For scheduling  needs, including cancellations and rescheduling, please call [place], [phone].   Follow-Up: At [facility], you and your health needs are our priority.  As part of our continuing mission to provide you with exceptional heart care, our providers  are all part of one team.  This team includes your primary Cardiologist (physician) and Advanced Practice Providers or APPs (Physician Assistants and Nurse Practitioners) who all work together to provide you with the care you need, when you need it.  Your next appointment:   6 month(s)  Provider:   Dr. Alda Amas  We recommend signing up for the patient portal called "MyChart".  Sign up information is provided on this After Visit Summary.  MyChart is used to connect with patients for Virtual Visits (Telemedicine).  Patients are able to view lab/test results, encounter notes, upcoming appointments, etc.  Non-urgent messages can be sent to your provider as well.   To learn more about what you can do with MyChart, go to [email or web address].   Other Instructions none

## 2023-09-05 ENCOUNTER — Encounter: Payer: Self-pay | Admitting: *Deleted

## 2023-09-05 LAB — COMPREHENSIVE METABOLIC PANEL WITH GFR
ALT: 24 IU/L (ref 0–32)
AST: 19 IU/L (ref 0–40)
Albumin: 4.6 g/dL (ref 3.9–4.9)
Alkaline Phosphatase: 66 IU/L (ref 44–121)
BUN/Creatinine Ratio: 13 (ref 9–23)
BUN: 10 mg/dL (ref 6–24)
Bilirubin Total: 0.3 mg/dL (ref 0.0–1.2)
CO2: 25 mmol/L (ref 20–29)
Calcium: 9.9 mg/dL (ref 8.7–10.2)
Chloride: 105 mmol/L (ref 96–106)
Creatinine, Ser: 0.78 mg/dL (ref 0.57–1.00)
Globulin, Total: 2.1 g/dL (ref 1.5–4.5)
Glucose: 77 mg/dL (ref 70–99)
Potassium: 4 mmol/L (ref 3.5–5.2)
Sodium: 141 mmol/L (ref 134–144)
Total Protein: 6.7 g/dL (ref 6.0–8.5)
eGFR: 94 mL/min/{1.73_m2} (ref >59)

## 2023-09-05 LAB — CBC WITH DIFFERENTIAL/PLATELET
Basophils Absolute: 0 10*3/uL (ref 0.0–0.2)
Basos: 1 %
EOS (ABSOLUTE): 0 10*3/uL (ref 0.0–0.4)
Eos: 1 %
Hematocrit: 40.4 % (ref 34.0–46.6)
Hemoglobin: 12.7 g/dL (ref 11.1–15.9)
Immature Grans (Abs): 0 10*3/uL (ref 0.0–0.1)
Immature Granulocytes: 0 %
Lymphocytes Absolute: 2.2 10*3/uL (ref 0.7–3.1)
Lymphs: 36 %
MCH: 27.2 pg (ref 26.6–33.0)
MCHC: 31.4 g/dL — ABNORMAL LOW (ref 31.5–35.7)
MCV: 87 fL (ref 79–97)
Monocytes Absolute: 0.6 10*3/uL (ref 0.1–0.9)
Monocytes: 9 %
Neutrophils Absolute: 3.3 10*3/uL (ref 1.4–7.0)
Neutrophils: 53 %
Platelets: 333 10*3/uL (ref 150–450)
RBC: 4.67 x10E6/uL (ref 3.77–5.28)
RDW: 13.1 % (ref 11.7–15.4)
WBC: 6.2 10*3/uL (ref 3.4–10.8)

## 2023-09-05 LAB — LIPID PANEL
Chol/HDL Ratio: 4.9 ratio — ABNORMAL HIGH (ref 0.0–4.4)
Cholesterol, Total: 220 mg/dL — ABNORMAL HIGH (ref 100–199)
HDL: 45 mg/dL (ref >39)
LDL Chol Calc (NIH): 154 mg/dL — ABNORMAL HIGH (ref 0–99)
Triglycerides: 119 mg/dL (ref 0–149)
VLDL Cholesterol Cal: 21 mg/dL (ref 5–40)

## 2023-09-05 LAB — HEMOGLOBIN A1C
Est. average glucose Bld gHb Est-mCnc: 123 mg/dL
Hgb A1c MFr Bld: 5.9 % — ABNORMAL HIGH (ref 4.8–5.6)

## 2023-09-11 ENCOUNTER — Encounter (HOSPITAL_COMMUNITY): Payer: Self-pay

## 2023-09-12 ENCOUNTER — Telehealth: Payer: Self-pay | Admitting: *Deleted

## 2023-09-12 ENCOUNTER — Other Ambulatory Visit: Payer: Self-pay | Admitting: *Deleted

## 2023-09-12 DIAGNOSIS — G8929 Other chronic pain: Secondary | ICD-10-CM

## 2023-09-12 DIAGNOSIS — R072 Precordial pain: Secondary | ICD-10-CM

## 2023-09-12 NOTE — Telephone Encounter (Signed)
 Made patient aware that exercise treadmill test had been ordered. Patient requesting to receive call from provider with reason for change in testing. Advise patient that this will be forward to provider.

## 2023-09-12 NOTE — Progress Notes (Signed)
 Order placed for Exercise treadmill test per Dr. Alda Amas. Patient made aware.

## 2023-09-15 ENCOUNTER — Ambulatory Visit (HOSPITAL_COMMUNITY)

## 2023-09-15 NOTE — Telephone Encounter (Signed)
 Called and made patient aware that her insurance would not cover Coronary CTA, per Dr. Alda Amas we requested exercise treadmill test. Patient verbalized an understanding,

## 2023-09-15 NOTE — Telephone Encounter (Signed)
 Please let patient know that her insurance would not cover the coronary CTA and requested treadmill test instead

## 2023-09-26 ENCOUNTER — Ambulatory Visit (HOSPITAL_COMMUNITY)
Admission: RE | Admit: 2023-09-26 | Discharge: 2023-09-26 | Disposition: A | Discharge status: Home / Self Care | Payer: PRIVATE HEALTH INSURANCE | Source: Ambulatory Visit | Attending: Cardiology | Admitting: Cardiology

## 2023-09-26 ENCOUNTER — Other Ambulatory Visit: Payer: Self-pay | Admitting: *Deleted

## 2023-09-26 DIAGNOSIS — R072 Precordial pain: Secondary | ICD-10-CM

## 2023-09-26 LAB — EXERCISE TOLERANCE TEST
Angina Index: 0
Duke Treadmill Score: 8
Estimated workload: 9.6
Exercise duration (min): 7 min
Exercise duration (sec): 42 s
MPHR: 172 {beats}/min
Peak HR: 181 {beats}/min
Percent HR: 105 %
RPE: 17
Rest HR: 86 {beats}/min
ST Depression (mm): 0 mm

## 2023-09-26 NOTE — Progress Notes (Signed)
Attestation order 

## 2023-09-30 ENCOUNTER — Ambulatory Visit: Payer: Self-pay | Admitting: Cardiology

## 2023-10-14 ENCOUNTER — Encounter: Payer: Self-pay | Admitting: *Deleted

## 2023-10-15 NOTE — Progress Notes (Deleted)
 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  However, given the tinnitus in that ear, as well as reported decreased hearing, will refer to ENT.   Migraine prevention:  Start topiramate  25mg  at bedtime.  Continue nortriptyline  to 100mg  at bedtime.  Migraine rescue:  sumatriptan  20mg  NS was denied, possibly because she needs to try a tablet?  Will have her try rizatriptan  10mg  Keep headache diary Refer to ENT regarding tinnitus, dizziness and left acoustic schwannoma Follow up 4 months.   Subjective:  Kristin Daniels is a 48 year old female with pituitary tumor (on cabergoline ) and history of symptomatic thyroid  nodule s/p ablation and migraines who follows up for migraines.   UPDATE:   Last seen in June 2023.   At that time, she reported worsening headaches.  She reports pain not only on the left side of head but also face and ear.  Also feels dizzy, off balance and with double vision with headache.   Intensity:  moderate Duration:  2 days Frequency:  they are now every other day.     Since last visit, she underwent radiofrequency ablation of benign symptomatic thyroid  nodule.   Current NSAIDS:  naproxen  550mg  Current analgesics:  none Current triptans:  none Current ergotamine:  none Current anti-emetic:  none Current muscle relaxants:  tizanidine  2mg  at bedtime Current anti-anxiolytic:  none Current sleep aide:  none Current Antihypertensive medications:  none Current Antidepressant medications:  nortriptyline  100mg  at bedtime Current Anticonvulsant medications:  topiramate  25mg  at bedtime Current anti-CGRP:  none Current Vitamins/Herbal/Supplements:   Riboflavin -magnesium -feverfew 100-90-25mg , MVI Current Antihistamines/Decongestants:  Zyrtec , Flonase  Other therapy:  none Hormone/birth control:  None Other medications:  cabergoline , alprazolam, Abilify   Caffeine:  No coffee.   Diet:  Ginger ale.  Drinks a lot of water. Exercise:  no Depression:  no; Anxiety:  no Other pain:  no.  Seen in ED on 04/23/2021 for low back pain radiating down right leg with numbness and weakness where MRI of lumbar spine revealed right L5-S1 paracentral disc extrusion impinging on the right L5 nerve root.  She was referred to neurosurgery Sleep hygiene:  Inconsistent.  6-7 hours but often wakes up throughout the night.   HISTORY:  Migraine: She has had headaches since her early 34s, at which time she was diagnosed with a pituitary adenoma.  Headaches are described as moderate to severe non-throbbing pressure involving top and front of head or left temporal region.  There is associated nausea, photophobia, phonophobia and scalp tenderness.  No associated vomiting, visual disturbance or numbness or weakness.  No specific triggers.  Rest helps relieve it.  They typically have occurred about 3 times a month.  However, they have been near daily since January 2020, lasting several hours up to all day.  She does not know why.  She has been treating headaches with analgesics (Advil and Tylenol) about 4 days a week.    Vertigo: She started experiencing episodes of vertigo in 2023.  Occurs separate from headaches.  It seems positional, such as standing up.  Spinning sensation for a few seconds.  It occurs twice a week but may have 2 weeks without episodes.  She also has ringing in the left ear.  Cannot  tell if she has hearing loss.    She takes cabergoline  for her pituitary adenoma and is followed by endocrinology.  Thyroid  and prolactin levels have been stable.  She had an eye exam which was unremarkable.    10/09/2021 MRI BRAIN W WO:  1. 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 2. 3 mm acoustic schwannoma at the left internal auditory canal, in retrospect stable from 2021. 05/31/2019 MRI BRAIN & PITUITARY W WO: two nonspecific punctate hyperintense foci in the right cerebral white matter (unremarkable) as well as 8 mm pituitary lesion which may be a microadenoma vs Rathke's cleft cyst. 03/18/2016 MRI PITUITARY W WO:  posterior central left-sided pituitary adenoma, likely proteinaceous, 7 mm x 4 mm x 4.5 mm extending to the insertion of the infundibulum which is slightly deviated toward the right, and mild remodeling of the sella floor.   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  She underwent reconstructive surgery in 2010.  PAST MEDICAL HISTORY: Past Medical History:  Diagnosis Date   Acid reflux    Colon polyp    Costochondral chest pain 12/01/2020   H. pylori infection    H. pylori infection 03/26/2019   History of seasonal allergies    Hypercholesterolemia    Lumbar radiculopathy, acute 04/10/2021   Lymph node symptom 09/21/2018   Migraines    Oral candidiasis 05/21/2021   Pain of right lower leg 12/01/2020   Pituitary tumor    Thyroid  nodule    Vaginal candidiasis 05/21/2021   Vaginal Pap smear, abnormal    MEDICATIONS: Current Outpatient Medications on File Prior to Visit  Medication Sig Dispense Refill   ALPRAZolam (XANAX) 1 MG tablet Take 1 mg by mouth daily.      ARIPiprazole (ABILIFY) 30 MG tablet Take 30 mg by mouth daily.     cabergoline  (DOSTINEX ) 0.5 MG tablet Take 0.5 tablets (0.25 mg total) by mouth 2 (two) times a week. 12 tablet 4   cetirizine  (ZYRTEC ) 10 MG tablet Take 1 tablet (10 mg total) by mouth daily. 90 tablet 3   ciclopirox  (PENLAC ) 8 % solution Apply topically at bedtime. Apply over nail and surrounding skin. Apply daily over previous coat. After seven (7) days, may remove with alcohol and continue cycle. 6.6 mL 0   clindamycin  (CLEOCIN -T) 1 % external solution Apply topically 2 (two) times daily as needed. Discuss on next visit before next refill. 30 mL 2   clindamycin -benzoyl peroxide (BENZACLIN) gel Apply topically 2 (two) times daily. 25 g 0   desonide  (DESOWEN ) 0.05 % cream Apply topically 2 (two) times daily. Do not use for more than 4 weeks 30 g 0   desonide  (DESOWEN ) 0.05 % ointment Apply 1 Application topically 2 (two) times daily. 15 g 0   FIBER PO Take by mouth.     fluticasone  (FLONASE ) 50 MCG/ACT nasal spray Place 2 sprays into both nostrils daily. 16 g 6   MAGNESIUM  PO Take by mouth.     metoprolol   tartrate (LOPRESSOR ) 100 MG tablet Take 2 hours prior to CT 1 tablet 0   Multiple Vitamin (MULTIVITAMIN) tablet Take 1 tablet by mouth daily.     nortriptyline  (PAMELOR ) 50 MG capsule TAKE 2 CAPSULES (100 MG TOTAL) BY MOUTH AT BEDTIME. 180 capsule 2   Olopatadine  HCl 0.2 % SOLN Apply 1 drop to eye daily. 1 Bottle 0   Omega-3 Fatty Acids (FISH OIL PO) Take by mouth.     omeprazole  (PRILOSEC) 40 MG capsule Take 1 capsule (40 mg total) by mouth daily. 30 capsule 3   Riboflavin -Magnesium -Feverfew 100-90-25 MG TABS Take 100 mg by mouth daily. 30 tablet 0   rizatriptan  (MAXALT ) 10 MG tablet Take 1 tablet (10 mg total) by mouth as needed for migraine. May repeat after 2 hours if needed.  Maximum 2 tablets in 24 hours. 10 tablet 5   tiZANidine  (ZANAFLEX ) 2 MG tablet Take 1 tablet (2 mg total) by mouth at bedtime. 15 tablet 0    topiramate  (TOPAMAX ) 25 MG tablet Take 1 tablet (25 mg total) by mouth at bedtime. 30 tablet 5   ZINC SULFATE-VITAMIN C MT Use as directed in the mouth or throat.     zolpidem (AMBIEN) 10 MG tablet Take 10 mg by mouth at bedtime as needed.     No current facility-administered medications on file prior to visit.    ALLERGIES: Allergies  Allergen Reactions   Metronidazole  Anaphylaxis and Hives   Pollen Extract    Diflucan  [Fluconazole ] Rash    Broke out in a rash with hives around her mouth, hands, face.    FAMILY HISTORY: Family History  Problem Relation Age of Onset   Hypertension Mother    Hypercholesterolemia Mother    Colon polyps Maternal Grandmother    Colon cancer Neg Hx    Esophageal cancer Neg Hx    Stomach cancer Neg Hx    Rectal cancer Neg Hx    BRCA 1/2 Neg Hx    Breast cancer Neg Hx       Objective:  *** General: No acute distress.  Patient appears well-groomed.   Head:  Normocephalic/atraumatic Neck:  Supple.  No paraspinal tenderness.  Full range of motion. Heart:  Regular rate and rhythm. Neuro:  Alert and oriented.  Speech fluent and not dysarthric.  Language intact.  Mildly decreased hearing on the left.  Otherwise, CN II-XII intact.  Bulk and tone normal.  Muscle strength 5/5 throughout.  Sensation to light touch intact.  Deep tendon reflexes 2+ throughout, toes downgoing.  Gait normal.  Romberg negative.    Janne Members, DO  CC: Fidel Huddle, DO

## 2023-10-16 ENCOUNTER — Ambulatory Visit: Admitting: Neurology

## 2023-12-08 ENCOUNTER — Ambulatory Visit: Admitting: Neurology

## 2023-12-09 ENCOUNTER — Telehealth: Payer: Self-pay

## 2023-12-09 NOTE — Telephone Encounter (Signed)
 Patient called requesting to move up appointments.

## 2023-12-10 ENCOUNTER — Ambulatory Visit

## 2023-12-17 ENCOUNTER — Ambulatory Visit: Admitting: Urology

## 2023-12-18 ENCOUNTER — Telehealth: Payer: Self-pay | Admitting: Endocrinology

## 2023-12-18 ENCOUNTER — Other Ambulatory Visit

## 2023-12-18 ENCOUNTER — Other Ambulatory Visit: Payer: Self-pay

## 2023-12-18 DIAGNOSIS — Z87898 Personal history of other specified conditions: Secondary | ICD-10-CM

## 2023-12-18 DIAGNOSIS — E221 Hyperprolactinemia: Secondary | ICD-10-CM

## 2023-12-18 MED ORDER — CABERGOLINE 0.5 MG PO TABS: 0.2500 mg | ORAL_TABLET | ORAL | 4 refills | Status: DC

## 2023-12-18 NOTE — Telephone Encounter (Signed)
 MEDICATION: cabergoline (DOSTINEX) 0.5 MG tablet   PHARMACY:    [id number] - [place], [place] - [address] (Ph: [phone])    HAS THE PATIENT CONTACTED THEIR PHARMACY?  Yes  LAST REFILL:  @@LASTREFILL @  IS THIS A 90 DAY SUPPLY : Yes  IS PATIENT OUT OF MEDICATION: No  IF NOT; HOW MUCH IS LEFT: Not Sure  LAST APPOINTMENT DATE: @2 /24/2025  NEXT APPOINTMENT DATE:@Visit  date not found  DO WE HAVE YOUR PERMISSION TO LEAVE A DETAILED MESSAGE?: Yes  OTHER COMMENTS: Patient cancelled 12/18/2023 lab and 12/24/2023 office visit due to out of state travel plans and not sure of return date.  **Let patient know to contact pharmacy at the end of the day to make sure medication is ready. **  ** Please notify patient to allow 48-72 hours to process**  **Encourage patient to contact the pharmacy for refills or they can request refills through [facility]**

## 2023-12-23 ENCOUNTER — Ambulatory Visit: Admitting: Physical Therapy

## 2023-12-24 ENCOUNTER — Ambulatory Visit: Admitting: Endocrinology

## 2023-12-25 ENCOUNTER — Other Ambulatory Visit: Payer: BC Managed Care – PPO

## 2023-12-29 ENCOUNTER — Ambulatory Visit: Payer: BC Managed Care – PPO | Admitting: Endocrinology

## 2023-12-31 ENCOUNTER — Ambulatory Visit: Admitting: Physical Therapy

## 2024-01-15 ENCOUNTER — Encounter: Payer: Self-pay | Admitting: Family Medicine

## 2024-02-26 ENCOUNTER — Other Ambulatory Visit: Payer: Self-pay | Admitting: Endocrinology

## 2024-02-26 DIAGNOSIS — Z87898 Personal history of other specified conditions: Secondary | ICD-10-CM

## 2024-02-26 DIAGNOSIS — E221 Hyperprolactinemia: Secondary | ICD-10-CM

## 2024-05-25 ENCOUNTER — Ambulatory Visit: Admitting: Neurology
